# Patient Record
Sex: Male | Born: 1940 | Race: White | Hispanic: No | Marital: Married | Smoking: Former smoker
Health system: Southern US, Community
[De-identification: ages and names within clinical notes are randomized; demographics above are authoritative.]

## PROBLEM LIST (undated history)

## (undated) DIAGNOSIS — M549 Dorsalgia, unspecified: Secondary | ICD-10-CM

## (undated) DIAGNOSIS — I4892 Unspecified atrial flutter: Principal | ICD-10-CM

## (undated) DIAGNOSIS — G8929 Other chronic pain: Secondary | ICD-10-CM

## (undated) DIAGNOSIS — C61 Malignant neoplasm of prostate: Secondary | ICD-10-CM

## (undated) DIAGNOSIS — R51 Headache: Secondary | ICD-10-CM

## (undated) DIAGNOSIS — C439 Malignant melanoma of skin, unspecified: Secondary | ICD-10-CM

## (undated) DIAGNOSIS — I10 Essential (primary) hypertension: Secondary | ICD-10-CM

## (undated) DIAGNOSIS — E291 Testicular hypofunction: Secondary | ICD-10-CM

## (undated) DIAGNOSIS — E785 Hyperlipidemia, unspecified: Secondary | ICD-10-CM

## (undated) DIAGNOSIS — C459 Mesothelioma, unspecified: Secondary | ICD-10-CM

## (undated) DIAGNOSIS — Z8546 Personal history of malignant neoplasm of prostate: Secondary | ICD-10-CM

## (undated) HISTORY — DX: Testicular hypofunction: E29.1

## (undated) HISTORY — DX: Malignant melanoma of skin, unspecified: C43.9

## (undated) HISTORY — DX: Personal history of malignant neoplasm of prostate: Z85.46

## (undated) HISTORY — PX: COLONOSCOPY W/ POLYPECTOMY: SHX1380

## (undated) HISTORY — DX: Hyperlipidemia, unspecified: E78.5

## (undated) HISTORY — PX: CARDIAC CATHETERIZATION: SHX172

## (undated) HISTORY — DX: Unspecified atrial flutter: I48.92

---
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
## 1996-12-17 ENCOUNTER — Encounter: Payer: Self-pay | Admitting: Internal Medicine

## 2000-06-25 ENCOUNTER — Encounter: Payer: Self-pay | Admitting: Internal Medicine

## 2000-08-19 DIAGNOSIS — Z8719 Personal history of other diseases of the digestive system: Secondary | ICD-10-CM

## 2001-01-09 ENCOUNTER — Encounter: Payer: Self-pay | Admitting: Internal Medicine

## 2001-01-29 ENCOUNTER — Other Ambulatory Visit: Admission: RE | Admit: 2001-01-29 | Discharge: 2001-01-29 | Payer: Self-pay | Admitting: Urology

## 2001-01-29 ENCOUNTER — Encounter (INDEPENDENT_AMBULATORY_CARE_PROVIDER_SITE_OTHER): Payer: Self-pay | Admitting: Specialist

## 2002-02-10 ENCOUNTER — Encounter: Payer: Self-pay | Admitting: Internal Medicine

## 2003-12-13 HISTORY — PX: MELANOMA EXCISION: SHX5266

## 2004-07-23 ENCOUNTER — Encounter: Payer: Self-pay | Admitting: Internal Medicine

## 2005-07-26 ENCOUNTER — Ambulatory Visit (HOSPITAL_COMMUNITY): Admission: RE | Admit: 2005-07-26 | Discharge: 2005-07-27 | Payer: Self-pay | Admitting: Neurosurgery

## 2006-01-22 ENCOUNTER — Encounter: Admission: RE | Admit: 2006-01-22 | Discharge: 2006-01-22 | Payer: Self-pay | Admitting: Neurosurgery

## 2006-04-23 ENCOUNTER — Encounter: Payer: Self-pay | Admitting: Gastroenterology

## 2006-05-14 ENCOUNTER — Encounter: Payer: Self-pay | Admitting: Gastroenterology

## 2006-05-16 ENCOUNTER — Encounter: Payer: Self-pay | Admitting: Gastroenterology

## 2006-07-19 HISTORY — PX: BACK SURGERY: SHX140

## 2006-08-19 HISTORY — PX: ROTATOR CUFF REPAIR: SHX139

## 2006-11-03 ENCOUNTER — Encounter: Payer: Self-pay | Admitting: Internal Medicine

## 2007-06-24 ENCOUNTER — Encounter: Payer: Self-pay | Admitting: Internal Medicine

## 2007-10-09 ENCOUNTER — Ambulatory Visit: Payer: Self-pay | Admitting: Internal Medicine

## 2007-10-09 DIAGNOSIS — I10 Essential (primary) hypertension: Secondary | ICD-10-CM

## 2007-10-09 DIAGNOSIS — R209 Unspecified disturbances of skin sensation: Secondary | ICD-10-CM | POA: Insufficient documentation

## 2007-10-09 DIAGNOSIS — E785 Hyperlipidemia, unspecified: Secondary | ICD-10-CM | POA: Insufficient documentation

## 2007-10-12 ENCOUNTER — Telehealth: Payer: Self-pay | Admitting: Internal Medicine

## 2007-10-12 DIAGNOSIS — E291 Testicular hypofunction: Secondary | ICD-10-CM

## 2007-10-12 DIAGNOSIS — F528 Other sexual dysfunction not due to a substance or known physiological condition: Secondary | ICD-10-CM

## 2007-10-12 HISTORY — DX: Testicular hypofunction: E29.1

## 2007-10-16 ENCOUNTER — Telehealth (INDEPENDENT_AMBULATORY_CARE_PROVIDER_SITE_OTHER): Payer: Self-pay | Admitting: *Deleted

## 2007-10-16 LAB — CONVERTED CEMR LAB
CO2: 31 meq/L (ref 19–32)
Calcium: 9.1 mg/dL (ref 8.4–10.5)
Cholesterol: 156 mg/dL (ref 0–200)
Direct LDL: 58.1 mg/dL
GFR calc Af Amer: 78 mL/min
Glucose, Bld: 106 mg/dL — ABNORMAL HIGH (ref 70–99)
HDL: 37 mg/dL — ABNORMAL LOW (ref >39.0)
Potassium: 4.4 meq/L (ref 3.5–5.1)
Triglycerides: 313 mg/dL (ref 0–149)

## 2007-10-20 ENCOUNTER — Telehealth (INDEPENDENT_AMBULATORY_CARE_PROVIDER_SITE_OTHER): Payer: Self-pay | Admitting: *Deleted

## 2007-10-28 ENCOUNTER — Ambulatory Visit: Payer: Self-pay | Admitting: Internal Medicine

## 2007-10-29 LAB — CONVERTED CEMR LAB
Chloride: 108 meq/L (ref 96–112)
Creatinine, Ser: 1.3 mg/dL (ref 0.4–1.5)
Sodium: 142 meq/L (ref 135–145)

## 2007-11-25 ENCOUNTER — Encounter: Admission: RE | Admit: 2007-11-25 | Discharge: 2007-11-25 | Payer: Self-pay | Admitting: Neurosurgery

## 2007-12-03 ENCOUNTER — Encounter: Payer: Self-pay | Admitting: Internal Medicine

## 2007-12-17 ENCOUNTER — Ambulatory Visit: Payer: Self-pay | Admitting: Internal Medicine

## 2007-12-17 LAB — CONVERTED CEMR LAB
Blood in Urine, dipstick: NEGATIVE
Glucose, Urine, Semiquant: NEGATIVE
WBC Urine, dipstick: NEGATIVE
pH: 6.5

## 2007-12-21 ENCOUNTER — Telehealth (INDEPENDENT_AMBULATORY_CARE_PROVIDER_SITE_OTHER): Payer: Self-pay | Admitting: *Deleted

## 2007-12-21 ENCOUNTER — Telehealth: Payer: Self-pay | Admitting: Internal Medicine

## 2008-01-21 ENCOUNTER — Telehealth: Payer: Self-pay | Admitting: Internal Medicine

## 2008-02-03 ENCOUNTER — Encounter: Payer: Self-pay | Admitting: Internal Medicine

## 2008-02-05 ENCOUNTER — Ambulatory Visit: Payer: Self-pay | Admitting: Internal Medicine

## 2008-02-05 LAB — CONVERTED CEMR LAB: Glucose, Bld: 103 mg/dL

## 2008-02-11 LAB — CONVERTED CEMR LAB
ALT: 29 units/L (ref 0–53)
AST: 26 units/L (ref 0–37)
Albumin: 4 g/dL (ref 3.5–5.2)
Basophils Relative: 0.4 % (ref 0.0–1.0)
Eosinophils Relative: 3.7 % (ref 0.0–5.0)
Hemoglobin: 15.2 g/dL (ref 13.0–17.0)
Lymphocytes Relative: 28.9 % (ref 12.0–46.0)
Monocytes Relative: 9.4 % (ref 3.0–12.0)
Neutro Abs: 2.8 10*3/uL (ref 1.4–7.7)
RBC: 4.84 M/uL (ref 4.22–5.81)
Testosterone: 454.22 ng/dL (ref 350.00–890)
WBC: 4.9 10*3/uL (ref 4.5–10.5)

## 2008-03-07 ENCOUNTER — Encounter: Payer: Self-pay | Admitting: Internal Medicine

## 2008-03-09 ENCOUNTER — Encounter: Payer: Self-pay | Admitting: Internal Medicine

## 2008-03-11 ENCOUNTER — Encounter: Payer: Self-pay | Admitting: Internal Medicine

## 2008-03-11 ENCOUNTER — Telehealth: Payer: Self-pay | Admitting: Internal Medicine

## 2008-04-07 ENCOUNTER — Telehealth (INDEPENDENT_AMBULATORY_CARE_PROVIDER_SITE_OTHER): Payer: Self-pay | Admitting: *Deleted

## 2008-04-26 ENCOUNTER — Encounter: Payer: Self-pay | Admitting: Internal Medicine

## 2008-05-02 ENCOUNTER — Telehealth (INDEPENDENT_AMBULATORY_CARE_PROVIDER_SITE_OTHER): Payer: Self-pay | Admitting: *Deleted

## 2008-06-06 ENCOUNTER — Ambulatory Visit: Payer: Self-pay | Admitting: Internal Medicine

## 2008-06-06 LAB — CONVERTED CEMR LAB
Nitrite: NEGATIVE
Specific Gravity, Urine: <1.005
Urobilinogen, UA: NEGATIVE
WBC Urine, dipstick: NEGATIVE

## 2008-06-07 ENCOUNTER — Telehealth (INDEPENDENT_AMBULATORY_CARE_PROVIDER_SITE_OTHER): Payer: Self-pay | Admitting: *Deleted

## 2008-06-07 LAB — CONVERTED CEMR LAB
Direct LDL: 69.3 mg/dL
HDL: 38.4 mg/dL — ABNORMAL LOW (ref >39.0)
PSA: 8.82 ng/mL — ABNORMAL HIGH (ref 0.10–4.00)
Total CHOL/HDL Ratio: 4.2

## 2008-06-16 ENCOUNTER — Encounter: Payer: Self-pay | Admitting: Internal Medicine

## 2008-06-21 ENCOUNTER — Encounter: Payer: Self-pay | Admitting: Internal Medicine

## 2008-06-23 ENCOUNTER — Ambulatory Visit: Payer: Self-pay | Admitting: Family Medicine

## 2008-07-04 ENCOUNTER — Telehealth (INDEPENDENT_AMBULATORY_CARE_PROVIDER_SITE_OTHER): Payer: Self-pay | Admitting: *Deleted

## 2008-07-08 ENCOUNTER — Encounter: Payer: Self-pay | Admitting: Internal Medicine

## 2008-07-19 ENCOUNTER — Encounter: Payer: Self-pay | Admitting: Internal Medicine

## 2008-07-19 HISTORY — PX: PROSTATECTOMY: SHX69

## 2008-07-21 ENCOUNTER — Telehealth (INDEPENDENT_AMBULATORY_CARE_PROVIDER_SITE_OTHER): Payer: Self-pay | Admitting: *Deleted

## 2008-07-25 ENCOUNTER — Telehealth (INDEPENDENT_AMBULATORY_CARE_PROVIDER_SITE_OTHER): Payer: Self-pay | Admitting: *Deleted

## 2008-08-01 ENCOUNTER — Inpatient Hospital Stay (HOSPITAL_COMMUNITY): Admission: RE | Admit: 2008-08-01 | Discharge: 2008-08-03 | Payer: Self-pay | Admitting: Urology

## 2008-08-01 ENCOUNTER — Encounter (INDEPENDENT_AMBULATORY_CARE_PROVIDER_SITE_OTHER): Payer: Self-pay | Admitting: Urology

## 2008-08-19 HISTORY — PX: OTHER SURGICAL HISTORY: SHX169

## 2008-10-06 ENCOUNTER — Ambulatory Visit: Payer: Self-pay | Admitting: Internal Medicine

## 2008-10-06 DIAGNOSIS — Z8546 Personal history of malignant neoplasm of prostate: Secondary | ICD-10-CM

## 2008-10-06 HISTORY — DX: Personal history of malignant neoplasm of prostate: Z85.46

## 2008-10-11 ENCOUNTER — Telehealth (INDEPENDENT_AMBULATORY_CARE_PROVIDER_SITE_OTHER): Payer: Self-pay | Admitting: *Deleted

## 2008-10-11 LAB — CONVERTED CEMR LAB
AST: 35 units/L (ref 0–37)
CO2: 29 meq/L (ref 19–32)
Calcium: 9.2 mg/dL (ref 8.4–10.5)
Chloride: 106 meq/L (ref 96–112)
Cholesterol: 170 mg/dL (ref 0–200)
Direct LDL: 92.4 mg/dL
Glucose, Bld: 95 mg/dL (ref 70–99)
Hemoglobin: 15.3 g/dL (ref 13.0–17.0)
Sodium: 143 meq/L (ref 135–145)
Triglycerides: 218 mg/dL (ref 0–149)

## 2008-10-21 ENCOUNTER — Telehealth (INDEPENDENT_AMBULATORY_CARE_PROVIDER_SITE_OTHER): Payer: Self-pay | Admitting: *Deleted

## 2008-11-10 ENCOUNTER — Encounter: Payer: Self-pay | Admitting: Internal Medicine

## 2009-02-15 ENCOUNTER — Encounter: Payer: Self-pay | Admitting: Internal Medicine

## 2009-02-17 ENCOUNTER — Telehealth (INDEPENDENT_AMBULATORY_CARE_PROVIDER_SITE_OTHER): Payer: Self-pay | Admitting: *Deleted

## 2009-02-24 ENCOUNTER — Telehealth (INDEPENDENT_AMBULATORY_CARE_PROVIDER_SITE_OTHER): Payer: Self-pay | Admitting: *Deleted

## 2009-04-27 ENCOUNTER — Encounter: Payer: Self-pay | Admitting: Internal Medicine

## 2009-04-27 LAB — CONVERTED CEMR LAB
ALT: 31 units/L
AST: 31 units/L
Hemoglobin: 14.8 g/dL
Potassium, serum: 4.3 mmol/L
TSH: 1.499 microintl units/mL
WBC, blood: 4.6 10*3/uL

## 2009-05-12 ENCOUNTER — Encounter: Payer: Self-pay | Admitting: Internal Medicine

## 2009-05-26 ENCOUNTER — Ambulatory Visit: Payer: Self-pay | Admitting: Internal Medicine

## 2009-05-26 LAB — CONVERTED CEMR LAB
Bilirubin Urine: NEGATIVE
Glucose, Urine, Semiquant: NEGATIVE
Ketones, urine, test strip: NEGATIVE
Specific Gravity, Urine: 1.015
pH: 6

## 2009-05-30 ENCOUNTER — Ambulatory Visit: Payer: Self-pay | Admitting: Internal Medicine

## 2009-06-08 LAB — CONVERTED CEMR LAB
BUN: 19 mg/dL (ref 6–23)
Cholesterol: 142 mg/dL (ref 0–200)
Creatinine, Ser: 1.3 mg/dL (ref 0.4–1.5)
Direct LDL: 64.1 mg/dL
GFR calc non Af Amer: 58.29 mL/min (ref >60)
Glucose, Bld: 100 mg/dL — ABNORMAL HIGH (ref 70–99)
HDL: 33.2 mg/dL — ABNORMAL LOW (ref >39.00)
Potassium: 4.5 meq/L (ref 3.5–5.1)
Triglycerides: 312 mg/dL — ABNORMAL HIGH (ref 0.0–149.0)
VLDL: 62.4 mg/dL — ABNORMAL HIGH (ref 0.0–40.0)

## 2009-06-09 ENCOUNTER — Telehealth (INDEPENDENT_AMBULATORY_CARE_PROVIDER_SITE_OTHER): Payer: Self-pay | Admitting: *Deleted

## 2009-06-15 ENCOUNTER — Telehealth (INDEPENDENT_AMBULATORY_CARE_PROVIDER_SITE_OTHER): Payer: Self-pay | Admitting: *Deleted

## 2009-07-05 ENCOUNTER — Telehealth (INDEPENDENT_AMBULATORY_CARE_PROVIDER_SITE_OTHER): Payer: Self-pay | Admitting: *Deleted

## 2009-09-06 ENCOUNTER — Encounter: Payer: Self-pay | Admitting: Internal Medicine

## 2009-09-15 ENCOUNTER — Encounter: Payer: Self-pay | Admitting: Internal Medicine

## 2009-11-21 ENCOUNTER — Telehealth: Payer: Self-pay | Admitting: Internal Medicine

## 2009-11-22 ENCOUNTER — Ambulatory Visit: Payer: Self-pay | Admitting: Internal Medicine

## 2009-11-24 ENCOUNTER — Ambulatory Visit: Payer: Self-pay | Admitting: Internal Medicine

## 2009-11-29 LAB — CONVERTED CEMR LAB
ALT: 28 units/L (ref 0–53)
AST: 29 units/L (ref 0–37)
BUN: 27 mg/dL — ABNORMAL HIGH (ref 6–23)
Chloride: 105 meq/L (ref 96–112)
Creatinine, Ser: 1.6 mg/dL — ABNORMAL HIGH (ref 0.4–1.5)
GFR calc non Af Amer: 45.81 mL/min (ref >60)
HDL: 36.6 mg/dL — ABNORMAL LOW (ref >39.00)
Potassium: 3.9 meq/L (ref 3.5–5.1)
Total CHOL/HDL Ratio: 4

## 2009-12-25 ENCOUNTER — Ambulatory Visit: Payer: Self-pay | Admitting: Internal Medicine

## 2009-12-26 LAB — CONVERTED CEMR LAB
CO2: 30 meq/L (ref 19–32)
Glucose, Bld: 90 mg/dL (ref 70–99)
Potassium: 4.4 meq/L (ref 3.5–5.1)
Sodium: 144 meq/L (ref 135–145)

## 2010-01-14 ENCOUNTER — Telehealth: Payer: Self-pay | Admitting: Internal Medicine

## 2010-01-16 ENCOUNTER — Encounter: Payer: Self-pay | Admitting: Internal Medicine

## 2010-01-26 ENCOUNTER — Encounter: Payer: Self-pay | Admitting: Internal Medicine

## 2010-01-30 ENCOUNTER — Ambulatory Visit: Payer: Self-pay

## 2010-01-30 ENCOUNTER — Encounter: Payer: Self-pay | Admitting: Internal Medicine

## 2010-03-05 ENCOUNTER — Telehealth: Payer: Self-pay | Admitting: Internal Medicine

## 2010-03-26 ENCOUNTER — Ambulatory Visit: Payer: Self-pay | Admitting: Internal Medicine

## 2010-03-26 ENCOUNTER — Telehealth: Payer: Self-pay | Admitting: Internal Medicine

## 2010-03-26 DIAGNOSIS — M5416 Radiculopathy, lumbar region: Secondary | ICD-10-CM | POA: Insufficient documentation

## 2010-04-03 ENCOUNTER — Telehealth (INDEPENDENT_AMBULATORY_CARE_PROVIDER_SITE_OTHER): Payer: Self-pay | Admitting: *Deleted

## 2010-05-01 ENCOUNTER — Encounter: Payer: Self-pay | Admitting: Gastroenterology

## 2010-05-08 ENCOUNTER — Telehealth: Payer: Self-pay | Admitting: Internal Medicine

## 2010-05-10 ENCOUNTER — Encounter (INDEPENDENT_AMBULATORY_CARE_PROVIDER_SITE_OTHER): Payer: Self-pay | Admitting: *Deleted

## 2010-05-29 ENCOUNTER — Encounter: Payer: Self-pay | Admitting: Internal Medicine

## 2010-06-22 ENCOUNTER — Encounter (INDEPENDENT_AMBULATORY_CARE_PROVIDER_SITE_OTHER): Payer: Self-pay | Admitting: *Deleted

## 2010-06-25 ENCOUNTER — Telehealth: Payer: Self-pay | Admitting: Internal Medicine

## 2010-06-27 ENCOUNTER — Ambulatory Visit: Payer: Self-pay | Admitting: Gastroenterology

## 2010-07-11 ENCOUNTER — Ambulatory Visit: Payer: Self-pay | Admitting: Gastroenterology

## 2010-07-16 ENCOUNTER — Encounter: Payer: Self-pay | Admitting: Gastroenterology

## 2010-08-08 ENCOUNTER — Encounter: Payer: Self-pay | Admitting: Internal Medicine

## 2010-08-22 ENCOUNTER — Encounter: Payer: Self-pay | Admitting: Internal Medicine

## 2010-09-12 ENCOUNTER — Encounter: Payer: Self-pay | Admitting: Internal Medicine

## 2010-09-16 LAB — CONVERTED CEMR LAB
Basophils Absolute: 0 10*3/uL (ref 0.0–0.1)
Eosinophils Absolute: 0.2 10*3/uL (ref 0.0–0.7)
Eosinophils Relative: 3.2 % (ref 0.0–5.0)
GFR calc non Af Amer: 59.2 mL/min (ref >60)
MCHC: 34.9 g/dL (ref 30.0–36.0)
MCV: 91.4 fL (ref 78.0–100.0)
Monocytes Absolute: 0.4 10*3/uL (ref 0.1–1.0)
Neutrophils Relative %: 55.5 % (ref 43.0–77.0)
Platelets: 184 10*3/uL (ref 150.0–400.0)
Potassium: 4.5 meq/L (ref 3.5–5.1)
RDW: 13.5 % (ref 11.5–14.6)
Sodium: 141 meq/L (ref 135–145)
WBC: 5.7 10*3/uL (ref 4.5–10.5)

## 2010-09-18 NOTE — Progress Notes (Signed)
Summary: giving plasma  Phone Note Call from Patient Call back at [facility] Phone [phone]   Summary of Call: Pt would like to know if it would be safe for him to give plasma. Army Fossa CMA  March 26, 2010 10:05 AM   Follow-up for Phone Call        okay from my standpoint Jose E. Paz MD  March 27, 2010 3:48 PM   Additional Follow-up for Phone Call Additional follow up Details #1::        Spoke with pt he is aware. Army Fossa CMA  March 27, 2010 4:16 PM

## 2010-09-18 NOTE — Letter (Signed)
Summary: [facility]  [facility]   Imported By: Lanelle Bal 07/16/2010 09:17:22  _____________________________________________________________________  External Attachment:    Type:   Image     Comment:   External Document

## 2010-09-18 NOTE — Letter (Signed)
Summary: f/u prostate ca , PSA undetectable, on androgel----Urology   [facility]   Imported By: Lanelle Bal 01/24/2010 13:00:44  _____________________________________________________________________  External Attachment:    Type:   Image     Comment:   External Document

## 2010-09-18 NOTE — Assessment & Plan Note (Signed)
Summary: cpx/kdc   Vital Signs:  Patient profile:   70 year old male Height:      72 inches Weight:      212.50 pounds Pulse rate:   75 / minute Pulse rhythm:   regular BP sitting:   136 / 82  (left arm) Cuff size:   large  Vitals Entered By: Army Fossa CMA (March 26, 2010 9:17 AM) CC: CPX: not fasting Comments unsure of last colonoscopy.   History of Present Illness: Here for Medicare AWV:  1.   Risk factors based on Past M, S, F history: yes  2.   Physical Activities: active, yard work, [facility] to the [facility]" 2-[facility] 3.   Depression/mood: denies, no problems notes  4.   Hearing: decreased hearing. s/p eval, was told hearing aid won't help 5.   ADL's: totally independent  6.   Fall Risk: low risk  7.   Home Safety: feels safe at home  8.   Height, weight, &visual acuity: see VS, vision corrected  9.   Counseling: yes  10.   Labs ordered based on risk factors: yes   11.           Referral Coordination: if needed  12.           Care Plan-- see a/p  13.            Cognitive Assessment: alertness, memory and motor skills seem  appropriate  in addition to above, we also discussed the following....  high cholesterol -- on simva x 10 years, no s/e , no myalgias  "other than from working out" HTN - no ambulatory BPs , good medication compliance  HYPOGONADISM, on  HRT per urology Prostate cancer, sees urology routinely      Preventive Screening-Counseling & Management  Caffeine-Diet-Exercise     Does Patient Exercise: yes     Times/week: 3  Current Medications (verified): 1)  Simvastatin 20 Mg Tabs (Simvastatin) .Marland Kitchen.. 1 By Mouth Daily. 2)  Amlodipine Besylate 5 Mg  Tabs (Amlodipine Besylate) .Marland Kitchen.. 1 By Mouth Once Daily 3)  Benazepril Hcl 40 Mg  Tabs (Benazepril Hcl) .Marland Kitchen.. 1 By Mouth Once Daily 4)  Gabapentin 600 Mg Tabs (Gabapentin) .Marland Kitchen.. 1 By Mouth Two Times A Day 5)  Viagra 100 Mg  Tabs (Sildenafil Citrate) .... As Directed 6)  Zolpidem Tartrate 10 Mg  Tabs (Zolpidem  Tartrate) .Marland Kitchen.. 1 By Mouth At Bedtime As Needed 7)  Hydrocodone-Acetaminophen 5-500 Mg  Tabs (Hydrocodone-Acetaminophen) .... Prn 8)  Cyclobenzaprine Hcl 10 Mg  Tabs (Cyclobenzaprine Hcl) .... Two Times A Day 9)  [facility] Aspirin Ec 81 Mg  Tbec (Aspirin) .Marland Kitchen.. 1 A Day 10)  Vit D, B, C 11)  Androgel .... Per Urology 12)  Omega 3 13)  Fenofibrate 160 Mg Tabs (Fenofibrate) .Marland Kitchen.. 1 By Mouth Once Daily  Allergies (verified): No Known Drug Allergies  Past History:  Past Medical History: HYPERTENSION , Dx aprox 2000 HYPERLIPIDEMIA  DIVERTICULITIS, HX OF 2002,admitted to the hospital one time, status-post two outpatient episodes HYPOGONADISM, MALE  ERECTILE DYSFUNCTION   PARESTHESIAS  LE,  per neurosurgery notes a combination of neuropathy and chronic L5 radiculopathy, on neurontin. F/u also w/ Dr Clarisse Gouge Prostate cancer, hx of (08/2008)  Past Surgical History: Reviewed history from 10/06/2008 and no changes required. rt shoulder surgery (2008) back surgery (2006) melanoma removed left arm (2005) Prostatectomy (09/13/2008)  Family History: CAD - F (MI age 72) HTN - F, M DM - M, bro stroke -  no colon Ca - no prostate Ca - bro, F  Social History: Married 2 kids in [place] since '84 Former Smoker Alcohol use-yes (moderately 1 drinks/wk) exercise-- active, goes to the "[facility]" diet healthy most of the time per patient  Does Patient Exercise:  yes  Review of Systems General:  Denies fatigue and fever. CV:  Denies chest pain or discomfort and swelling of feet. Resp:  Denies cough and shortness of breath. GI:  Denies bloody stools, nausea, and vomiting. GU:  Denies dysuria and hematuria. Psych:  Denies anxiety and depression.  Physical Exam  General:  alert, well-developed, and well-nourished.   Neck:  no masses and no thyromegaly.   Lungs:  normal respiratory effort, no intercostal retractions, no accessory muscle use, and normal breath sounds.   Heart:  normal rate, regular rhythm, and  no murmur.   Abdomen:  soft, non-tender, no distention, no masses, no guarding, and no rigidity.   Extremities:  no lower extremity edema Psych:  Cognition and judgment appear intact. Alert and cooperative with normal attention span and concentration.  not anxious appearing and not depressed appearing.     Impression & Recommendations:  Problem # 1:  HEALTH SCREENING (ICD-V70.0)  Td 2009 pneumonia shot-- at age 65 per patient  shingles shot 10-09-07  Cscope @ age 18 in [place] per patient  (no report available )  counsed: continue with healthy lifestyle  Orders: Medicare -1st Annual Wellness Visit [phone])  Problem # 2:  HYPOGONADISM, MALE (ICD-257.2) HRT per urology  Problem # 3:  HYPERTENSION (ICD-401.9)  at goal  EKG--sinus rhythm BMP today to monitor kidney function (creat was elevated, better after he d/c diclofenac) His updated medication list for this problem includes:    Amlodipine Besylate 5 Mg Tabs (Amlodipine besylate) .Marland Kitchen... 1 by mouth once daily    Benazepril Hcl 40 Mg Tabs (Benazepril hcl) .Marland Kitchen... 1 by mouth once daily  BP today: 136/82 Prior BP: 140/80 (11/22/2009)  Labs Reviewed: K+: 4.4 (12/25/2009) Creat: : 1.4 (12/25/2009)   Chol: 129 (11/24/2009)   HDL: 36.60 (11/24/2009)   LDL: 66 (11/24/2009)   TG: 130.0 (11/24/2009)  Orders: Venipuncture (60454) TLB-CBC Platelet - w/Differential (85025-CBCD) TLB-BMP (Basic Metabolic Panel-BMET) (80048-METABOL) EKG w/ Interpretation (93000)  Problem # 4:  HYPERLIPIDEMIA (ICD-272.4) on simvastatin for many years and also on  Norvasc. we recently decreased dose of simva will RTC fasting for labs  His updated medication list for this problem includes:    Simvastatin 20 Mg Tabs (Simvastatin) .Marland Kitchen... 1 by mouth daily.    Fenofibrate 160 Mg Tabs (Fenofibrate) .Marland Kitchen... 1 by mouth once daily  Labs Reviewed: SGOT: 29 (11/24/2009)   SGPT: 28 (11/24/2009)   HDL:36.60 (11/24/2009), 33.20 (05/30/2009)  LDL:66 (11/24/2009), DEL  (10/06/2008)  Chol:129 (11/24/2009), 142 (05/30/2009)  Trig:130.0 (11/24/2009), 312.0 (05/30/2009)  Problem # 5:  DEGENERATIVE JOINT DISEASE (ICD-715.90) RF hydrocodone, takes it rarely (2 times a week aprox) His updated medication list for this problem includes:    Hydrocodone-acetaminophen 5-500 Mg Tabs (Hydrocodone-acetaminophen) .Marland Kitchen... Prn    Cvs Aspirin Ec 81 Mg Tbec (Aspirin) .Marland Kitchen... 1 a day  Complete Medication List: 1)  Simvastatin 20 Mg Tabs (Simvastatin) .Marland Kitchen.. 1 by mouth daily. 2)  Fenofibrate 160 Mg Tabs (Fenofibrate) .Marland Kitchen.. 1 by mouth once daily 3)  Amlodipine Besylate 5 Mg Tabs (Amlodipine besylate) .Marland Kitchen.. 1 by mouth once daily 4)  Benazepril Hcl 40 Mg Tabs (Benazepril hcl) .Marland Kitchen.. 1 by mouth once daily 5)  Gabapentin 600 Mg Tabs (Gabapentin) .Marland KitchenMarland KitchenMarland Kitchen 1  by mouth two times a day 6)  Viagra 100 Mg Tabs (Sildenafil citrate) .... As directed 7)  Zolpidem Tartrate 10 Mg Tabs (Zolpidem tartrate) .Marland Kitchen.. 1 by mouth at bedtime as needed 8)  Hydrocodone-acetaminophen 5-500 Mg Tabs (Hydrocodone-acetaminophen) .... Prn 9)  Cyclobenzaprine Hcl 10 Mg Tabs (Cyclobenzaprine hcl) .... Two times a day 10)  Cvs Aspirin Ec 81 Mg Tbec (Aspirin) .Marland Kitchen.. 1 a day 11)  Vit D, B, C  12)  Androgel  .... Per urology 13)  Omega 3   Patient Instructions: 1)  Please schedule a follow-up appointment in 6 months .  Prescriptions: HYDROCODONE-ACETAMINOPHEN 5-500 MG  TABS (HYDROCODONE-ACETAMINOPHEN) prn  #90 x 0   Entered and Authorized by:   Nolon Rod. Paz MD   Signed by:   Nolon Rod. Paz MD on 03/26/2010   Method used:   Print then Give to Patient   RxID:   [id number]    Risk Factors:  Exercise:  yes    Times per week:  3    Preventive Care Screening  Last Pneumovax:    Date:  08/20/2007    Results:  given

## 2010-09-18 NOTE — Progress Notes (Signed)
Summary: refill  Phone Note Refill Request Message from:  Fax from Pharmacy on June 25, 2010 11:43 AM  Refills Requested: Medication #1:  ZOLPIDEM TARTRATE 10 MG  TABS 1 by mouth at bedtime as needed [facility] [phone]  Initial call taken by: Okey Regal [place],  June 25, 2010 11:45 AM  Follow-up for Phone Call        last filled 11/22/09 x 6 refills. Follow-up by: Army Fossa CMA,  June 25, 2010 11:47 AM  Additional Follow-up for Phone Call Additional follow up Details #1::        ok #30 and 6 RF Shamere Dilworth E. Yosef Krogh MD  June 25, 2010 1:40 PM     Prescriptions: ZOLPIDEM TARTRATE 10 MG  TABS (ZOLPIDEM TARTRATE) 1 by mouth at bedtime as needed  #30 x 6   Entered by:   Army Fossa CMA   Authorized by:   Nolon Rod. Demontre Padin MD   Signed by:   Army Fossa CMA on 06/25/2010   Method used:   Printed then faxed [facility] ...       [facility]  [address] [phone]* (retail)       [address]       [place], [place]  [address]       Ph: [phone] or [phone]       Fax: [phone]   RxID:   [id number]

## 2010-09-18 NOTE — Progress Notes (Signed)
Summary: Refill Request  Phone Note Refill Request Message from:  [facility] [address] Fax #: [phone]  Refills Requested: Medication #1:  ZOLPIDEM TARTRATE 10 MG  TABS 1 by mouth at bedtime as needed   Dosage confirmed as above?Dosage Confirmed   Supply Requested: 1 month   Last Refilled: 08/23/2009 Next Appointment Scheduled: 4.6.11 Initial call taken by: Harold Barban,  November 21, 2009 8:24 AM  Follow-up for Phone Call        #30 with 6 refills on 02/24/09, has appt on 4/6 [facility]  November 21, 2009 5:18 PM     Prescriptions: ZOLPIDEM TARTRATE 10 MG  TABS (ZOLPIDEM TARTRATE) 1 by mouth at bedtime as needed  #30 x 6   Entered by:   Shary Decamp   Authorized by:   Nolon Rod. Mckay Brandt MD   Signed by:   Shary Decamp on 11/22/2009   Method used:   Printed then faxed to ...       [facility]  [address] [phone]* (retail)       [address]       [place], [place]  [address]       Ph: [phone] or [phone]       Fax: [phone]   RxID:   [id number]

## 2010-09-18 NOTE — Letter (Signed)
Summary: [facility]  [facility]   Imported By: Lanelle Bal 07/16/2010 09:18:28  _____________________________________________________________________  External Attachment:    Type:   Image     Comment:   External Document

## 2010-09-18 NOTE — Letter (Signed)
Summary: prostate cancer f/u , low testost, on AndroGel, stable Urology    [facility]   Imported By: Lanelle Bal 06/26/2010 12:43:11  _____________________________________________________________________  External Attachment:    Type:   Image     Comment:   External Document

## 2010-09-18 NOTE — Procedures (Signed)
Summary: Colonoscopy  Patient: [name] Note: All result statuses are Final unless otherwise noted.  Tests: (1) Colonoscopy (COL)   COL Colonoscopy           DONE     [facility]     [address]     [place], [place]  [address]           COLONOSCOPY PROCEDURE REPORT           PATIENT:  [name], [name]  MR#:  [id number]     BIRTHDATE:  03-12-1941, 69 yrs. old  GENDER:  male     ENDOSCOPIST:  Judie Petit T. Russella Dar, MD, [facility]     Referred by:  Willow Ora, M.D.     PROCEDURE DATE:  07/11/2010     PROCEDURE:  Colonoscopy with snare polypectomy     ASA CLASS:  Class II     INDICATIONS:  1) surveillance and high-risk screening  2) history     of adenomatous colon polyps: 04/2006     MEDICATIONS:   Fentanyl 75 mcg IV, Versed 8 mg IV     DESCRIPTION OF PROCEDURE:   After the risks benefits and     alternatives of the procedure were thoroughly explained, informed     consent was obtained.  Digital rectal exam was performed and     revealed no abnormalities.   The LB [id number] endoscope     was introduced through the anus and advanced to the cecum, which     was identified by both the appendix and ileocecal valve, without     limitations.  The quality of the prep was excellent, using     MoviPrep.  The instrument was then slowly withdrawn as the colon     was fully examined.     <<PROCEDUREIMAGES>>     FINDINGS:  Three polyps were found in the cecum. They were 5 - 12     mm in size. Polyps were snared, then cauterized with monopolar     cautery. Retrieval was successful. Piecemeal polypectomy of the 10     and 12 mm polyps.  Two polyps were found in the ascending colon.     They were 5 - 7 mm in size. Polyps were snared without cautery.     Retrieval was successful. Moderate diverticulosis was found in the     sigmoid to descending colon.  This was otherwise a normal     examination of the colon.   Retroflexed views in the rectum     revealed internal hemorrhoids, small.  The  time to cecum =  2     minutes. The scope was then withdrawn (time =  21  min) from the     patient and the procedure completed.           COMPLICATIONS:  None           ENDOSCOPIC IMPRESSION:     1) 5 - 12 mm Three polyps in the cecum     2) 5 - 7 mm Two polyps in the ascending colon     3) Moderate diverticulosis in the sigmoid to descending colon     4) Internal hemorrhoids           RECOMMENDATIONS:     1) Hold aspirin, aspirin products, and anti-inflamatory     medication for 2 weeks.     2) Await pathology results     3) Repeat Colonoscopy in 1 year  if polyps adenomatous, otherwise     3 years.           Venita Lick. Russella Dar, MD, [facility]           n.     eSIGNED:   Venita Lick. Stark at 07/11/2010 10:26 AM           Meta Hatchet, [id number]  Note: An exclamation mark (!) indicates a result that was not dispersed into the flowsheet. Document Creation Date: 07/11/2010 10:27 AM _______________________________________________________________________  (1) Order result status: Final Collection or observation date-time: 07/11/2010 10:19 Requested date-time:  Receipt date-time:  Reported date-time:  Referring Physician:   Ordering Physician: Claudette Head [phone]) Specimen Source:  Source: Launa Grill Order Number: [id number] Lab site:   Appended Document: Colonoscopy     Procedures Next Due Date:    Colonoscopy: 06/2011

## 2010-09-18 NOTE — Letter (Signed)
Summary: [facility] Instructions  [facility]  [address] [place], [place] [address]   Phone: [phone]  Fax: [phone]       [name]    1941-03-28    MRN: [id number]        Procedure Day Dorna Bloom: Wednesday 07-11-10     Arrival Time: 9:00 am     Procedure Time: 10:00 am     Location of Procedure:                    _x _  [facility] (4th Floor)   PREPARATION FOR COLONOSCOPY WITH MOVIPREP   Starting 5 days prior to your procedure  07-06-10 do not eat nuts, seeds, popcorn, corn, beans, peas,  salads, or any raw vegetables.  Do not take any fiber supplements (e.g. Metamucil, Citrucel, and Benefiber).  THE DAY BEFORE YOUR PROCEDURE         DATE:  07-10-10  DAY:  Tuesday  1.  Drink clear liquids the entire day-NO SOLID FOOD  2.  Do not drink anything colored red or purple.  Avoid juices with pulp.  No orange juice.  3.  Drink at least 64 oz. (8 glasses) of fluid/clear liquids during the day to prevent dehydration and help the prep work efficiently.  CLEAR LIQUIDS INCLUDE: Water Jello Ice Popsicles Tea (sugar ok, no milk/cream) Powdered fruit flavored drinks Coffee (sugar ok, no milk/cream) Gatorade Juice: apple, white grape, white cranberry  Lemonade Clear bullion, consomm, broth Carbonated beverages (any kind) Strained chicken noodle soup Hard Candy                             4.  In the morning, mix first dose of MoviPrep solution:    Empty 1 Pouch A and 1 Pouch B into the disposable container    Add lukewarm drinking water to the top line of the container. Mix to dissolve    Refrigerate (mixed solution should be used within 24 hrs)  5.  Begin drinking the prep at 5:00 p.m. The MoviPrep container is divided by 4 marks.   Every 15 minutes drink the solution down to the next mark (approximately 8 oz) until the full liter is complete.   6.  Follow completed prep with 16 oz of clear liquid of your choice (Nothing red or purple).   Continue to drink clear liquids until bedtime.  7.  Before going to bed, mix second dose of MoviPrep solution:    Empty 1 Pouch A and 1 Pouch B into the disposable container    Add lukewarm drinking water to the top line of the container. Mix to dissolve    Refrigerate  THE DAY OF YOUR PROCEDURE      DATE:  07-11-10  DAY:  Wednesday  Beginning at   5:00 a.m. (5 hours before procedure):         1. Every 15 minutes, drink the solution down to the next mark (approx 8 oz) until the full liter is complete.  2. Follow completed prep with 16 oz. of clear liquid of your choice.    3. You may drink clear liquids until  8:00 a.m.  (2 HOURS BEFORE PROCEDURE).   MEDICATION INSTRUCTIONS  Unless otherwise instructed, you should take regular prescription medications with a small sip of water   as early as possible the morning of your procedure.        OTHER INSTRUCTIONS  You will need a responsible adult at least 70 years of age to accompany you and drive you home.   This person must remain in the waiting room during your procedure.  Wear loose fitting clothing that is easily removed.  Leave jewelry and other valuables at home.  However, you may wish to bring a book to read or  an iPod/MP3 player to listen to music as you wait for your procedure to start.  Remove all body piercing jewelry and leave at home.  Total time from sign-in until discharge is approximately 2-3 hours.  You should go home directly after your procedure and rest.  You can resume normal activities the  day after your procedure.  The day of your procedure you should not:   Drive   Make legal decisions   Operate machinery   Drink alcohol   Return to work  You will receive specific instructions about eating, activities and medications before you leave.    The above instructions have been reviewed and explained to me by   Wyona Almas RN  June 27, 2010 10:29 AM     I fully understand and can  verbalize these instructions _____________________________ Date _________

## 2010-09-18 NOTE — Progress Notes (Signed)
Summary: Colonoscopy  Phone Note Call from Patient Call back at Work Phone [phone]   Caller: Patient Summary of Call: Patient called to let us know that he had his last colonoscpoy 4 years ago with Dr. Bascom Levels in [facility] and they want him to sch another one because of his polps and family hx. He went ahead and sch the appts. He wanted to make sure this doctor was ok? Initial call taken by: Harold Barban,  April 03, 2010 11:21 AM  Follow-up for Phone Call        ok w/ me Nolon Rod. Paz MD  April 04, 2010 9:14 AM   Additional Follow-up for Phone Call Additional follow up Details #1::        Left detailed message informing him this was ok.  Additional Follow-up by: Harold Barban,  April 04, 2010 9:40 AM

## 2010-09-18 NOTE — Miscellaneous (Signed)
Summary: [facility] Previsit/prep  Clinical Lists Changes  Medications: Added new medication of MOVIPREP 100 GM  SOLR (PEG-KCL-NACL-NASULF-NA ASC-C) As per prep instructions. - Signed Rx of MOVIPREP 100 GM  SOLR (PEG-KCL-NACL-NASULF-NA ASC-C) As per prep instructions.;  #1 x 0;  Signed;  Entered by: Wyona Almas RN;  Authorized by: Meryl Dare MD [facility];  Method used: [facility]  [address] [phone]*, [address] [place], [place], [place]  [address], Ph: [phone] or [phone], Fax: [phone] Observations: Added new observation of NKA: T (06/27/2010 9:50)    Prescriptions: MOVIPREP 100 GM  SOLR (PEG-KCL-NACL-NASULF-NA ASC-C) As per prep instructions.  #1 x 0   Entered by:   Wyona Almas RN   Authorized by:   Meryl Dare MD [facility]   Signed by:   Wyona Almas RN on 06/27/2010   Method used:   Electronically to        [facility]  [address] [phone]* (retail)       [address]       [place], [place]  [id number]       Ph: [phone] or [phone]       Fax: [phone]   RxID:   [id number]

## 2010-09-18 NOTE — Progress Notes (Signed)
Summary: new referral  Phone Note Call from Patient   Caller: Patient Summary of Call: Pt called and states that he is not comfortable with seeing Dr.[facility] for his colonoscopy. He would like to be seen within the [place] group. Please advise if it is okay to put in a new referral. Army Fossa CMA  May 08, 2010 8:57 AM   Follow-up for Phone Call        please do Follow-up by: [facility] E. Paz MD,  May 08, 2010 3:44 PM

## 2010-09-18 NOTE — Assessment & Plan Note (Signed)
Summary: 6 MTH FU/NS/KDC   Vital Signs:  Patient profile:   70 year old male Height:      72 inches Weight:      220 pounds BMI:     29.95 Pulse rate:   60 / minute BP sitting:   140 / 80  Vitals Entered By: Shary Decamp (November 22, 2009 10:04 AM) CC: rov - not fasting   History of Present Illness: HYPERTENSION -- ambulatory BPs rarely checked, usually better than today   HYPERLIPIDEMIA -- chol meds changed base on last FLP, tolerates well   HYPOGONADISM, MALE -- on HRT per urology  ERECTILE DYSFUNCTION  -- using VEC per urology note   PARESTHESIAS  LE-- f/u w/ Dr Vela Prose, was advised to "keep an eye in the sugar"  Prostate cancer-- note from urology reviewed,stable    Current Medications (verified): 1)  Simvastatin 40 Mg  Tabs (Simvastatin) .Marland Kitchen.. 1 By Mouth Qd 2)  Amlodipine Besylate 5 Mg  Tabs (Amlodipine Besylate) .Marland Kitchen.. 1 By Mouth Once Daily 3)  Benazepril Hcl 40 Mg  Tabs (Benazepril Hcl) .Marland Kitchen.. 1 By Mouth Once Daily 4)  Diclofenac Sodium 75 Mg  Tbec (Diclofenac Sodium) .... Bid 5)  Gabapentin 600 Mg Tabs (Gabapentin) .Marland Kitchen.. 1 By Mouth Two Times A Day 6)  Viagra 100 Mg  Tabs (Sildenafil Citrate) .... As Directed 7)  Zolpidem Tartrate 10 Mg  Tabs (Zolpidem Tartrate) .Marland Kitchen.. 1 By Mouth At Bedtime As Needed 8)  Hydrocodone-Acetaminophen 5-500 Mg  Tabs (Hydrocodone-Acetaminophen) .... Prn 9)  Cyclobenzaprine Hcl 10 Mg  Tabs (Cyclobenzaprine Hcl) .... Two Times A Day 10)  [facility]vs Aspirin Ec 81 Mg  Tbec (Aspirin) .Marland Kitchen.. 1 A Day 11)  Vit D, B, C 12)  Androgel .... Per Urology 13)  Omega 3 14)  Fenofibrate 160 Mg Tabs (Fenofibrate) .Marland Kitchen.. 1 By Mouth Once Daily  Allergies (verified): No Known Drug Allergies  Past History:  Past Medical History: HYPERTENSION , Dx aprox 2000 HYPERLIPIDEMIA  DIVERTICULITIS, HX OF 2002,admitted to the hospital one time, status-post two outpatient episodes HYPOGONADISM, MALE  ERECTILE DYSFUNCTION   PARESTHESIAS  LE,  per neurosurgery notes a combination  of neuropathy and chronic L5 radiculopathy, on neurontin. F/u also w/ Dr Clarisse Gouge Prostate cancer, hx of (08/2008)  Past Surgical History: Reviewed history from 10/06/2008 and no changes required. rt shoulder surgery (2008) back surgery (2006) melanoma removed left arm (2005) Prostatectomy (09/13/2008)  Social History: Married 2 kids in [place] since '84 Former Smoker Alcohol use-yes (moderately 3 drinks/wk) exercise-- doing more lately diet healthy most of the time per patient   Review of Systems CV:  Denies chest pain or discomfort and swelling of feet. Resp:  Denies shortness of breath; occasionally cough , thinks related to polen, no fever. GI:  Denies bloody stools, diarrhea, nausea, and vomiting. GU:  Denies hematuria, urinary frequency, and urinary hesitancy. Neuro:  occ.  has tingling at the L  hand, by the first and second finger along with some tingling close to the elbow no nocturnal symptoms no neck pain no overuse of the left hand.  Physical Exam  General:  alert, well-developed, and well-nourished.   Lungs:  normal respiratory effort, no intercostal retractions, no accessory muscle use, and normal breath sounds.   Heart:  normal rate, regular rhythm, and no murmur.   Extremities:  no lower extremity edema Neurologic:  neck full range of motion upper extremities with normal pinprick examination  motor symmetric rule out reflexes symmetric   Impression &  Recommendations:  Problem # 1:  PARESTHESIA (ICD-782.0) lower extremity paresthesia follow-up by neurology was told to "keep an eye on the sugar" today he also has a left arm paresthesia, see review of systems, neurological exam normal.  Observation  Problem # 2:  HYPERTENSION (ICD-401.9) well-controlled, labs His updated medication list for this problem includes:    Amlodipine Besylate 5 Mg Tabs (Amlodipine besylate) .Marland Kitchen... 1 by mouth once daily    Benazepril Hcl 40 Mg Tabs (Benazepril hcl) .Marland Kitchen... 1 by mouth  once daily  BP today: 140/80 Prior BP: 124/80 (05/26/2009)  Labs Reviewed: K+: 4.5 (05/30/2009) Creat: : 1.3 (05/30/2009)   Chol: 142 (05/30/2009)   HDL: 33.20 (05/30/2009)   LDL: DEL (10/06/2008)   TG: 312.0 (05/30/2009)  Problem # 3:  HYPERLIPIDEMIA (ICD-272.4) chol meds changed base on last FLP, niacin was discontinued now on fenofibrate labs His updated medication list for this problem includes:    Simvastatin 40 Mg Tabs (Simvastatin) .Marland Kitchen... 1 by mouth qd    Fenofibrate 160 Mg Tabs (Fenofibrate) .Marland Kitchen... 1 by mouth once daily  Labs Reviewed: SGOT: 31 (May 27, 202010)   SGPT: 31 (May 27, 202010)   HDL:33.20 (05/30/2009), 41.4 (10/06/2008)  LDL:DEL (10/06/2008), DEL (06/06/2008)  Chol:142 (05/30/2009), 170 (10/06/2008)  Trig:312.0 (05/30/2009), 218 (10/06/2008)  Complete Medication List: 1)  Simvastatin 40 Mg Tabs (Simvastatin) .Marland Kitchen.. 1 by mouth qd 2)  Amlodipine Besylate 5 Mg Tabs (Amlodipine besylate) .Marland Kitchen.. 1 by mouth once daily 3)  Benazepril Hcl 40 Mg Tabs (Benazepril hcl) .Marland Kitchen.. 1 by mouth once daily 4)  Diclofenac Sodium 75 Mg Tbec (Diclofenac sodium) .... Bid 5)  Gabapentin 600 Mg Tabs (Gabapentin) .Marland Kitchen.. 1 by mouth two times a day 6)  Viagra 100 Mg Tabs (Sildenafil citrate) .... As directed 7)  Zolpidem Tartrate 10 Mg Tabs (Zolpidem tartrate) .Marland Kitchen.. 1 by mouth at bedtime as needed 8)  Hydrocodone-acetaminophen 5-500 Mg Tabs (Hydrocodone-acetaminophen) .... Prn 9)  Cyclobenzaprine Hcl 10 Mg Tabs (Cyclobenzaprine hcl) .... Two times a day 10)  [facility]vs Aspirin Ec 81 Mg Tbec (Aspirin) .Marland Kitchen.. 1 a day 11)  Vit D, B, C  12)  Androgel  .... Per urology 13)  Omega 3  14)  Fenofibrate 160 Mg Tabs (Fenofibrate) .Marland Kitchen.. 1 by mouth once daily  Patient Instructions: 1)  please come back fasting 2)  FLP  AST ALT ------dx high cholesterol 3)  BMP----- dx hypertension 4)  Please schedule a follow-up appointment in 4 months  (yearly exam)

## 2010-09-18 NOTE — Procedures (Signed)
Summary: Colonoscopy/[facility]  Colonoscopy/[facility]   Imported By: Lanelle Bal 07/16/2010 09:20:30  _____________________________________________________________________  External Attachment:    Type:   Image     Comment:   External Document

## 2010-09-18 NOTE — Letter (Signed)
Summary: Health Screening--carotid artery disease?.  [facility]  Health Screening   Imported By: Lanelle Bal 01/11/2010 11:53:02  _____________________________________________________________________  External Attachment:    Type:   Image     Comment:   External Document  Appended Document: Health Screening--carotid artery disease?.  [facility] Health Screening--carotid artery disease?.  Rx [facility]

## 2010-09-18 NOTE — Miscellaneous (Signed)
Summary: Orders Update  Clinical Lists Changes  Problems: Added new problem of CAROTID ARTERY DISEASE (ICD-433.10) Orders: Added new Test order of Carotid Duplex (Carotid Duplex) - Signed 

## 2010-09-18 NOTE — Letter (Signed)
Summary: Patient Notice- Polyp Results  [facility]  [address] [place], [place] [address]   Phone: [phone]  Fax: [phone]        July 16, 2010 MRN: [id number]    [facility] [address] [place], [place]  [address]    Dear [name],  I am pleased to inform you that the colon polyp(s) removed during your recent colonoscopy was (were) found to be benign (no cancer detected) upon pathologic examination.  I recommend you have a repeat colonoscopy examination in 1 year to look for recurrent polyps, as having colon polyps increases your risk for having recurrent polyps or even colon cancer in the future.  Should you develop new or worsening symptoms of abdominal pain, bowel habit changes or bleeding from the rectum or bowels, please schedule an evaluation with either your primary care physician or with me.  Continue treatment plan as outlined the day of your exam.  Please call us if you are having persistent problems or have questions about your condition that have not been fully answered at this time.  Sincerely,  Meryl Dare MD [facility]  This letter has been electronically signed by your physician.  Appended Document: Patient Notice- Polyp Results Letter mailed

## 2010-09-18 NOTE — Letter (Signed)
Summary: [facility]  [facility]   Imported By: Lanelle Bal 07/16/2010 09:16:35  _____________________________________________________________________  External Attachment:    Type:   Image     Comment:   External Document

## 2010-09-18 NOTE — Letter (Signed)
Summary: Pre Visit Letter Revised  [facility]  [address] [place], [place] [address]   Phone: [phone]  Fax: [phone]        05/10/2010 MRN: [id number]   [facility] [place] [address] [place], [place]  [address]              Welcome to the Gastroenterology Division at [facility].    You are scheduled to see a nurse for your pre-procedure visit on May 29, 2010 at 4:30pm on the 3rd floor at [facility], [address]  We ask that you try to arrive at our office 15 minutes prior to your appointment time to allow for check-in.  Please take a minute to review the attached form.  If you answer "Yes" to one or more of the questions on the first page, we ask that you call the person listed at your earliest opportunity.  If you answer "No" to all of the questions, please complete the rest of the form and bring it to your appointment.    Your nurse visit will consist of discussing your medical and surgical history, your immediate family medical history, and your medications.   If you are unable to list all of your medications on the form, please bring the medication bottles to your appointment and we will list them.  We will need to be aware of both prescribed and over the counter drugs.  We will need to know exact dosage information as well.    Please be prepared to read and sign documents such as consent forms, a financial agreement, and acknowledgement forms.  If necessary, and with your consent, a friend or relative is welcome to sit-in on the nurse visit with you.  Please bring your insurance card so that we may make a copy of it.  If your insurance requires a referral to see a specialist, please bring your referral form from your primary care physician.  No co-pay is required for this nurse visit.     If you cannot keep your appointment, please call [phone] to cancel or reschedule prior to your appointment date.  This allows [place] the opportunity  to schedule an appointment for another patient in need of care.    Thank you for choosing [facility] for your medical needs.  We appreciate the opportunity to care for you.  Please visit [place] at our website  to learn more about our practice.  Sincerely, The Gastroenterology Division

## 2010-09-18 NOTE — Progress Notes (Signed)
Summary: next carotid ultrasound  Phone Note Outgoing Call   Summary of Call: health screening January 2011 showed a  question of carotid artery disease. Plan : Carotid ultrasound DX carotid artery disease? Elek Holderness E. Awab Abebe MD  Jan 14, 2010 8:58 AM   discussed with pt ...Marland KitchenMarland KitchenShary Decamp  Jan 16, 2010 11:04 AM

## 2010-09-18 NOTE — Letter (Signed)
Summary: [facility]  [facility]   Imported By: Lanelle Bal 09/25/2009 13:43:24  _____________________________________________________________________  External Attachment:    Type:   Image     Comment:   External Document

## 2010-09-18 NOTE — Progress Notes (Signed)
Summary: drug interaction (lmom 7/18)  Phone Note From Pharmacy Message from:  Fax from Pharmacy on March 05, 2010 8:02 AM  Refills Requested: Medication #1:  SIMVASTATIN 40 MG  TABS 1 by mouth QD [facility] [address] - fax [phone] -   Initial call taken by: Okey Regal [place],  March 05, 2010 8:03 AM [facility]er[facility]  [address] [phone]* Summary of Call: [facility] simvastatin 40 mg - interacting drug amlodipine besylate 5mg  Initial call taken by: Okey Regal [place],  March 05, 2010 8:11 AM  Follow-up for Phone Call        patient is on fenofibrate  and amlodipine although he is doing well, I would recommend to decrease simvastatin to 20 mg daily please let patient know about the change Will reassess his cholesterol on return to the office   Follow-up by: [facility] E. Javeah Loeza MD,  March 05, 2010 10:00 AM  Additional Follow-up for Phone Call Additional follow up Details #1::        Left message with pts wife to have him call us back. Army Fossa CMA  March 05, 2010 10:08 AM     Additional Follow-up for Phone Call Additional follow up Details #2::    Pt is aware. Army Fossa CMA  March 06, 2010 2:04 PM   New/Updated Medications: SIMVASTATIN 20 MG TABS (SIMVASTATIN) 1 by mouth daily. Prescriptions: SIMVASTATIN 20 MG TABS (SIMVASTATIN) 1 by mouth daily.  #30 x 1   Entered by:   Army Fossa CMA   Authorized by:   Nolon Rod. Lynsi Dooner MD   Signed by:   Army Fossa CMA on 03/05/2010   Method used:   [facility]        [phone]* (retail)       [address]       [place], [place]  [id number]       Ph: [phone] or [phone]       Fax: [phone]   RxID:   [id number]

## 2010-09-20 NOTE — Letter (Signed)
Summary: Rx local injection, Dr Ethelene Hal -- Orthopaedics  [facility]Low [facility]   Imported By: Maryln Gottron 08/16/2010 12:54:40  _____________________________________________________________________  External Attachment:    Type:   Image     Comment:   External Document

## 2010-09-20 NOTE — Op Note (Signed)
Summary: Lumbar Epidural Steroid Injection/[facility]  [facility]   Imported By: Lanelle Bal 08/31/2010 11:17:06  _____________________________________________________________________  External Attachment:    Type:   Image     Comment:   External Document

## 2010-09-24 ENCOUNTER — Other Ambulatory Visit: Payer: Self-pay | Admitting: Internal Medicine

## 2010-09-24 ENCOUNTER — Encounter: Payer: Self-pay | Admitting: Internal Medicine

## 2010-09-24 ENCOUNTER — Ambulatory Visit (INDEPENDENT_AMBULATORY_CARE_PROVIDER_SITE_OTHER): Payer: MEDICARE | Admitting: Internal Medicine

## 2010-09-24 DIAGNOSIS — I10 Essential (primary) hypertension: Secondary | ICD-10-CM

## 2010-09-24 DIAGNOSIS — E785 Hyperlipidemia, unspecified: Secondary | ICD-10-CM

## 2010-09-24 DIAGNOSIS — R209 Unspecified disturbances of skin sensation: Secondary | ICD-10-CM

## 2010-09-24 LAB — LIPID PANEL
Cholesterol: 197 mg/dL (ref 0–200)
HDL: 51.5 mg/dL (ref >39.00)
VLDL: 22.8 mg/dL (ref 0.0–40.0)

## 2010-09-24 LAB — BASIC METABOLIC PANEL
CO2: 26 mEq/L (ref 19–32)
Chloride: 106 mEq/L (ref 96–112)
Potassium: 3.8 mEq/L (ref 3.5–5.1)
Sodium: 142 mEq/L (ref 135–145)

## 2010-09-24 LAB — AST: AST: 26 U/L (ref 0–37)

## 2010-09-24 LAB — ALT: ALT: 19 U/L (ref 0–53)

## 2010-10-04 NOTE — Letter (Signed)
Summary: continue Neurontin, hydrocodone p.r.n.---Orthopaedics   Orthopaedics   Imported By: Maryln Gottron 09/25/2010 09:37:50  _____________________________________________________________________  External Attachment:    Type:   Image     Comment:   External Document

## 2010-10-04 NOTE — Assessment & Plan Note (Signed)
Summary: 6 month followup/kn  Nurse Visit   Vital Signs:  Patient profile:   70 year old male Height:      72 inches Weight:      206 pounds BMI:     28.04 Pulse rate:   70 / minute Pulse rhythm:   regular BP sitting:   142 / 80  (left arm) Cuff size:   large  Vitals Entered By: Army Fossa CMA (September 24, 2010 9:40 AM)  Past History:  Past Medical History: HYPERTENSION , Dx aprox 2000 HYPERLIPIDEMIA  DIVERTICULITIS, HX OF 2002,admitted to the hospital one time, status-post two outpatient episodes HYPOGONADISM, MALE  ERECTILE DYSFUNCTION   PARESTHESIAS  LE,  per neurosurgery notes a combination of neuropathy and chronic L5 radiculopathy, on neurontin. used to see  Dr Clarisse Gouge, now sees Dr Ethelene Hal  Prostate cancer, hx of (08/2008)  Past Surgical History: Reviewed history from 10/06/2008 and no changes required. rt shoulder surgery (2008) back surgery (2006) melanoma removed left arm (2005) Prostatectomy (09/13/2008)   Physical Exam  General:  alert and well-developed.   Head:  face symmetric Ears:  R ear normal and L ear normal.   Nose:  not congested Lungs:  normal respiratory effort, no intercostal retractions, no accessory muscle use, and normal breath sounds.   Heart:  normal rate, regular rhythm, and no murmur.   Extremities:  no lower extremity edema   Impression & Recommendations:  Problem # 1:  URI see instructions  Problem # 2:  PARESTHESIA (ICD-782.0) now under the care of Dr. Ethelene Hal He is self discontinue simvastatin 3  weeks ago, apparently  it was making his pain worse.  Problem # 3:  HYPERTENSION (ICD-401.9)  ambulatory blood pressure, usually 120/80  No change His updated medication list for this problem includes:    Amlodipine Besylate 5 Mg Tabs (Amlodipine besylate) .Marland Kitchen... 1 by mouth once daily    Benazepril Hcl 40 Mg Tabs (Benazepril hcl) .Marland Kitchen... 1 by mouth once daily  BP today: 142/80 Prior BP: 136/82 (03/26/2010)  Labs Reviewed: K+:  4.5 (03/26/2010) Creat: : 1.3 (03/26/2010)   Chol: 129 (11/24/2009)   HDL: 36.60 (11/24/2009)   LDL: 66 (11/24/2009)   TG: 130.0 (11/24/2009)  Orders: Venipuncture ([address]) TLB-BMP (Basic Metabolic Panel-BMET) (80048-METABOL) Specimen Handling (34742)  Problem # 4:  HYPERLIPIDEMIA (ICD-272.4)  self discontinued simvastatin 3 weeks ago, patient felt that he was making his chronic leg paresthesia worse The following medications were removed from the medication list:    Simvastatin 20 Mg Tabs (Simvastatin) .Marland Kitchen... 1 by mouth daily. His updated medication list for this problem includes:    Fenofibrate 160 Mg Tabs (Fenofibrate) .Marland Kitchen... 1 by mouth once daily  Orders: TLB-ALT (SGPT) (84460-ALT) TLB-AST (SGOT) (84450-SGOT) TLB-Lipid Panel (80061-LIPID) Specimen Handling (59563)  Complete Medication List: 1)  Fenofibrate 160 Mg Tabs (Fenofibrate) .Marland Kitchen.. 1 by mouth once daily 2)  Amlodipine Besylate 5 Mg Tabs (Amlodipine besylate) .Marland Kitchen.. 1 by mouth once daily 3)  Benazepril Hcl 40 Mg Tabs (Benazepril hcl) .Marland Kitchen.. 1 by mouth once daily 4)  Gabapentin 600 Mg Tabs (Gabapentin) .Marland Kitchen.. 1 by mouth two times a day 5)  Viagra 100 Mg Tabs (Sildenafil citrate) .... As directed 6)  Hydrocodone-acetaminophen 5-500 Mg Tabs (Hydrocodone-acetaminophen) .... Prn 7)  [facility]vs Aspirin Ec 81 Mg Tbec (Aspirin) .Marland Kitchen.. 1 a day 8)  Vit D, B, C  9)  Androgel  .... Per urology 10)  Mvi  11)  Otc Potassium    Patient Instructions: 1)  rest,  fluids, Mucinex DM over-the-counter twice a day. 2)  If not better in a few days, let me know   3)  Please schedule a follow-up appointment in 6 months . fasting, physical exam   History of Present Illness: ROV doing well If the some cough and sputum production 2 weeks ago. Denies fevers, wheezing, runny nose or sore throat. Some nasal congestion       Review of Systems General:  as far as his pain management, he switched to Dr Ethelene Hal , go to the local injection which  helped. HTN--ambulatory BPs-- usually wnl on the 120s HYPERLIPIDEMIA ,  d/c simvastatin 3 weeks ago d/t leg pain (apparently worsened the chronic numbness-neuropathy in the L leg). Feels better   . CV:  Denies chest pain or discomfort and swelling of feet.  CC: 6 month f/u- fasting  Comments c/o coughing x 2 weeks  Stopped simvastatin- states it caused leg pain [facility] [address]    Current Medications (verified): 1)  Fenofibrate 160 Mg Tabs (Fenofibrate) .Marland Kitchen.. 1 By Mouth Once Daily 2)  Amlodipine Besylate 5 Mg  Tabs (Amlodipine Besylate) .Marland Kitchen.. 1 By Mouth Once Daily 3)  Benazepril Hcl 40 Mg  Tabs (Benazepril Hcl) .Marland Kitchen.. 1 By Mouth Once Daily 4)  Gabapentin 600 Mg Tabs (Gabapentin) .Marland Kitchen.. 1 By Mouth Two Times A Day 5)  Viagra 100 Mg  Tabs (Sildenafil Citrate) .... As Directed 6)  Hydrocodone-Acetaminophen 5-500 Mg  Tabs (Hydrocodone-Acetaminophen) .... Prn 7)  [facility]vs Aspirin Ec 81 Mg  Tbec (Aspirin) .Marland Kitchen.. 1 A Day 8)  Vit D, B, C 9)  Androgel .... Per Urology 10)  Mvi 11)  Otc Potassium  Allergies (verified): No Known Drug Allergies  Orders Added: 1)  Venipuncture [36415] 2)  TLB-BMP (Basic Metabolic Panel-BMET) [80048-METABOL] 3)  TLB-ALT (SGPT) [84460-ALT] 4)  TLB-AST (SGOT) [84450-SGOT] 5)  TLB-Lipid Panel [80061-LIPID] 6)  Specimen Handling [99000] 7)  Est. Patient Level III [[address]]

## 2010-10-15 ENCOUNTER — Telehealth: Payer: Self-pay | Admitting: Internal Medicine

## 2010-10-25 NOTE — Progress Notes (Signed)
Summary: Copied of labs  Phone Note Call from Patient Call back at Home Phone [phone]   Summary of Call: Patient called noting that his wife will be here tomorrow for an appt and would like copies of his last two labs printed for her to pick up. He is aware we will place it up front for her.  Initial call taken by: Lucious Groves CMA,  October 15, 2010 3:10 PM

## 2010-11-06 ENCOUNTER — Other Ambulatory Visit: Payer: Self-pay | Admitting: Internal Medicine

## 2010-12-26 ENCOUNTER — Other Ambulatory Visit: Payer: Self-pay | Admitting: Internal Medicine

## 2011-01-01 ENCOUNTER — Encounter: Payer: Self-pay | Admitting: Internal Medicine

## 2011-01-01 NOTE — Op Note (Signed)
NAMEDEAVIN, FORST NO.:  [id number]   MEDICAL RECORD NO.:  [id number]          PATIENT TYPE:  INP   LOCATION:  1434                         FACILITY:  [facility]   PHYSICIAN:  Valetta Fuller, M.D.  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   OPERATION/PROCEDURE:  Robotic-assisted laparoscopic radical retropubic  prostatectomy.   SURGEON:  Valetta Fuller, M.D.   ASSISTANT:  Delia Chimes, N.P.   ANESTHESIA:  General endotracheal.   INDICATIONS:  Mr. Pote is a 70 year old male.  He was diagnosed with  favorable clinical stage T1c adenocarcinoma of the prostate by Dr. Su Grand.  PSA was 8.8.  The patient had ultrasound which revealed a 62-gram  gland.  The patient had two cores positive for a Gleason 3+3=6  adenocarcinoma of the prostate.  The patient has minimal voiding  complaints with an AUA symptom score of 9.  Preoperative sexual  functioning score was 5 out of 25.  The patient underwent extensive  counseling with Dr. Brunilda Payor and then myself with regard to treatment  options.  All options were discussed including active surveillance,  radiation and surgical approaches.  After discussing the various  options, the patient elected to have a robotic prostatectomy.  He  appeared to understand the advantages, disadvantages, potential risks  and complications of the surgery with specific attention towards  discussion of sexual dysfunction and urinary incontinence.  The patient  has gone through the preparatory steps. Has had preoperative physical  therapy and has attended the preoperative conference.  He has had  perioperative Unasyn and placement of compression boots.   DESCRIPTION OF PROCEDURE:   The patient was brought to the operating room  where he had successful induction of general endotracheal anesthesia.  He was placed in the lithotomy position.  All extremities carefully  padded.  The patient was placed in steep Trendelenburg position after  being carefully secured to the operative table.  He was then prepped and  draped in the usual manner.  Foley catheter was placed sterilely on the  field.  Initial camera port was chosen 18 cm above the pubic symphysis,  just to the left of the umbilicus.  A standard open Hasson technique was  utilized with no difficulties placing the trocar or insufflating the  abdomen.  All other trocars were placed with direct visual guidance.  This included 12 mm and 5 mm laparoscopic assist ports and three 8 mm  robotic trocar ports.  After trocar placement, the surgical cart was  docked.  Bladder was filled to help identify the space of Retzius which  was dissected with blunt dissection technique along with electrocautery  scissors.  Superficial fascia over the bladder neck and endopelvic  fascia of the prostate was defatted.  Endopelvic fascia was incised from  base to apex.  Levator musculature was  swept off the apex of the  prostate, isolating the dorsal venous complex which was stapled with the  ETS stapling device.  With a Foley catheter aiding the identification of  the anterior bladder neck, this was transected with hot electrocautery  scissors.  Foley catheter was found in the midline and retracted  anteriorly.  Indigo carmine was given and we were well away from the  ureteral orifices.  Posterior bladder neck was then transected and the  structures including bilateral seminal vesicles and vas deferens were  individually dissected free.  Posterior plane between the prostate and  rectum was then established.   The patient then had nerve sparing performed bilaterally.  Superficial  fascia overlying the prostate was incised on the lateral  aspect of the  prostate and swept posteriorly.  The plane was established between the  posterior lateral aspect of the prostate and the neurovascular bundle  which was then swept from apex to base.  The prostate was then lifted  and the vascular pedicles were taken with Hem-o-lok clips.  Superficial  fascia was swept carefully off the apex of the prostate.  The anterior  urethra was transected.  The Foley catheter was then removed.  Posterior  urethra was then transected leaving a nice urethral stump.  A small  amount rectoureterolysis tissue was sharply divided and the prostate  specimen was brought out of the pelvis.  Copious pelvic irrigation was  then performed and rectal insufflation revealed no evidence of rectal  injury.  The bladder neck did not require any closure.  The bladder neck  and urethra were reapproximated with a 2-0 Vicryl suture.  Once our  reapproximation stitch was in place, which had also incorporated some  Denonvilliers fascia, we went ahead and completed the anastomosis with a  double-armed 3-0 Monocryl suture on a 360-degree running manner.  A new  catheter was placed without difficulty.  The bladder was irrigated.  No  obvious extravasation was noted.  A pelvic drain was placed through one  of the robotic trocars and secured to the abdominal skin.  The 12 mm  trocar site was closed with 0 Vicryl suture with the aid of a suture  passer.  The prostate specimen was placed in an Endopouch retrieval bag.  The camera port incision was extended slightly to allow for the removal  the specimen.  That fascia was closed with a running #1 Vicryl suture.  All trocars were taken out with direct visual guidance.  The patient had  infiltration of Marcaine in all the wound sites and then the skin was  closed with clips.  The bladder appeared to be draining well at the  completion of the procedure.  The patient had no obvious complications  or problems and was brought to the  recovery room in stable condition.  Estimated blood loss approximately 250 mL.      Valetta Fuller, M.D.  Electronically Signed     DSG/MEDQ  D:  08/01/2008  T:  08/02/2008  Job:  [id number]

## 2011-01-04 NOTE — Discharge Summary (Signed)
NAMEROSS, BENDER NO.:  [id number]   MEDICAL RECORD NO.:  [id number]          PATIENT TYPE:  INP   LOCATION:  1434                         FACILITY:  [facility]   PHYSICIAN:  Valetta Fuller, M.D.  DATE OF BIRTH:  1940/12/11   DATE OF ADMISSION:  08/01/2008  DATE OF DISCHARGE:  08/03/2008                               DISCHARGE SUMMARY   ADMISSION DIAGNOSIS:  Favorable clinical stage T1c adenocarcinoma of the  prostate.   DISCHARGE DIAGNOSIS:  Favorable clinical stage T1c adenocarcinoma of the  prostate.   PROCEDURE:  Robotic-assisted laparoscopic radical prostatectomy.   HISTORY AND PHYSICAL:  For full details, please see admission history  and physical.  Briefly, [name] is a 70 year old male who has recently  been diagnosed with favorable clinical stage T1c adenocarcinoma of the  prostate.  After a thorough discussion regarding management options for  treatment, the patient elected to proceed with a robotic-assisted  laparoscopic radical prostatectomy.   HOSPITAL COURSE:  On August 01, 2008, the patient was taken to the  operating room and underwent a robotic-assisted laparoscopic radical  prostatectomy.  He tolerated this procedure well and without  complications.  Postoperatively, he was able to be transferred to a  regular hospital room following recovery from anesthesia.  He was able  to begin ambulation the night of surgery.  He was monitored that evening  and remained hemodynamically stable.  He had good urine output.  On the  morning of postoperative day #1, his hematocrit was checked and found to  be stable at 38.9.  He did have excellent urine output with minimal  output from his pelvic drain.  Therefore, his pelvic drain was removed.  He was placed on a clear liquid diet and continued to ambulate.  He was  reevaluated on the afternoon of postoperative day #1.  His urine output,  blood pressure, and pulse all seemed to be stable.  He was also  able to  tolerate his diet without any nausea or vomiting.  He did complain of  abdominal pain.  His bowel sounds were positive.  His abdomen was  slightly distended.  He was given medications for this, which did not  help to relieve this pain.  Therefore, it was decided that he would stay  overnight for evaluation due to a history of diverticulitis.  On the  morning of postoperative day #2, he was found to be doing well with less  abdominal pain and distention.  He was able to tolerate his fluids.  Therefore, he was felt stable for discharge and had met all discharge  criteria.   DISPOSITION:  Home.   DISCHARGE MEDICATIONS:  He was instructed to resume his normal home  medications consisting of:  1. Amlodipine.  2. Benazepril.  3. Cyclobenzaprine.  4. Gabapentin.  5. Simvastatin.  6. Zolpidem.   He was instructed to resume his aspirin, multivitamins and herbal  medications in 10 days after discharge from the hospital.  In addition,  he was provided a prescription for Vicodin to take as needed for pain.  He was told  to use Colace as a stool softener.  He was given a  prescription for Cipro to begin 2 days prior to his return visit for  Foley catheter removal.   DISCHARGE INSTRUCTIONS:  He was instructed to be ambulatorybut  specifically told to refrain from any heavy lifting, strenuous activity,  or driving.  He was instructed on Foley catheter careand told to  gradually increase his diet over the course of the next few days.   FOLLOWUP:  He will follow up in 1 week for Foley catheter removal and  skin staple removal.      Martin Chimes, NP      Valetta Fuller, M.D.  Electronically Signed    MA/MEDQ  D:  08/04/2008  T:  08/04/2008  Job:  [id number]

## 2011-01-04 NOTE — Op Note (Signed)
NAMEGLENDAL, Castro                 ACCOUNT NO.:  [id number]   MEDICAL RECORD NO.:  [id number]          PATIENT TYPE:  AMB   LOCATION:  SDS                          FACILITY:  [facility]   PHYSICIAN:  Danae Orleans. Venetia Maxon, M.D.  DATE OF BIRTH:  03/01/1941   DATE OF PROCEDURE:  07/26/2005  DATE OF DISCHARGE:                                 OPERATIVE REPORT   PREOPERATIVE DIAGNOSIS:  Left L4-L5 disc herniation with spondylosis,  degenerative disc disease, and radiculopathy.   POSTOPERATIVE DIAGNOSIS:  Left L4-L5 disc herniation with spondylosis,  degenerative disc disease, and radiculopathy.   PROCEDURE:  Left L4-L5 microdiscectomy with microdissection.   SURGEON:  Danae Orleans. Venetia Maxon, M.D.   ASSISTANT:  Clydene Fake, M.D.   ANESTHESIA:  General endotracheal anesthesia.   ESTIMATED BLOOD LOSS:  Minimal.   COMPLICATIONS:  None.   DISPOSITION:  Recovery room.   INDICATIONS FOR PROCEDURE:  [name] is a 70 year old man with a left L4-  L5 disc herniation with significant left L5 radiculopathy.  He had a free  fragment of herniated disc material appreciated on MRI with significant L5  nerve root compression.  It was elected to take him to surgery for  microdiscectomy.   DESCRIPTION OF PROCEDURE:  [name] was brought to the operating room.  Following satisfactory uncomplicated induction of general endotracheal  anesthesia, the patient was placed in a prone position on the Wilson frame.  His low back was then prepped and draped in the usual sterile fashion.  The  area of planned incision was infiltrated with 0.25% Marcaine and 0.5%  lidocaine with 1:200,000 epinephrine.  An incision was made in the midline  and carried through the subcutaneous tissues to the lumbodorsal fascia which  was incised on the left side of the midline.  Subperiosteal dissection was  performed exposing  the L4-L5 interspace.  A marker was placed at this level  and interoperative x-ray confirmed correct  orientation.  Subsequently,  hemilaminectomy of L4 was performed with a high speed drill and Kerrison  rongeurs and the foraminotomy was performed overlying the L5 nerve root.  The microscope was brought onto the field and using microdissection  technique, the lateral thecal sac and L5 nerve root were mobilized medially.  A thin contained free fragment of herniated disc material was identified and  this was directly beneath the L5 nerve root.  Using a micropituitary, a  large fragment of herniated disc material was removed with significant  decompression of the nerve root.  This did not appear to extend into the  interspace and there did not appear to be any residual disc material causing  nerve root compression.  It was consequently elected not to open the annulus  or to explore further.  A coronary dilator was easily inserted along the  floor of the canal and out the neural foramen.  Hemostasis was assured with  Gelfoam soaked in thrombin.  The wound was irrigated and infiltrated with 2  mL of Depo-Medrol and Fentany8l.  The self-retaining retractor was removed.  The lumbodorsal fascia was closed with 0 Vicryl  sutures, the subcutaneous  tissues were reapproximated with 2-0 Vicryl interrupted inverted sutures,  and the skin edges were reapproximated with interrupted 3-0 Vicryl  subcuticular stitch.  The wound was dressed with Dermabond.  The patient was  extubated in the operating room and taken to the recovery room in stable  condition having tolerated the operation well.  Counts were correct at the  end of the case.      Danae Orleans. Venetia Maxon, M.D.  Electronically Signed     JDS/MEDQ  D:  07/26/2005  T:  07/27/2005  Job:  [id number]

## 2011-01-08 ENCOUNTER — Other Ambulatory Visit: Payer: Self-pay | Admitting: *Deleted

## 2011-01-08 NOTE — Telephone Encounter (Signed)
He used to take Ambien, it was discontinued but  okay to refill if patient feels it is needed.  Call #[phone], 1 refill

## 2011-01-09 MED ORDER — ZOLPIDEM TARTRATE 10 MG PO TABS: 10.0000 mg | ORAL_TABLET | Freq: Every evening | ORAL | Status: DC | PRN

## 2011-01-09 NOTE — Telephone Encounter (Signed)
Pt states he is taking

## 2011-01-11 ENCOUNTER — Other Ambulatory Visit: Payer: Self-pay | Admitting: Internal Medicine

## 2011-03-25 ENCOUNTER — Other Ambulatory Visit: Payer: Self-pay | Admitting: Internal Medicine

## 2011-04-01 ENCOUNTER — Encounter: Payer: MEDICARE | Admitting: Internal Medicine

## 2011-04-01 ENCOUNTER — Ambulatory Visit (INDEPENDENT_AMBULATORY_CARE_PROVIDER_SITE_OTHER): Payer: Medicare Other | Admitting: Internal Medicine

## 2011-04-01 ENCOUNTER — Encounter: Payer: Self-pay | Admitting: Internal Medicine

## 2011-04-01 DIAGNOSIS — I10 Essential (primary) hypertension: Secondary | ICD-10-CM

## 2011-04-01 DIAGNOSIS — E291 Testicular hypofunction: Secondary | ICD-10-CM

## 2011-04-01 DIAGNOSIS — E785 Hyperlipidemia, unspecified: Secondary | ICD-10-CM

## 2011-04-01 LAB — AST: AST: 29 U/L (ref 0–37)

## 2011-04-01 LAB — LIPID PANEL
Cholesterol: 200 mg/dL (ref 0–200)
HDL: 54.2 mg/dL (ref >39.00)
LDL Cholesterol: 115 mg/dL — ABNORMAL HIGH (ref 0–99)
VLDL: 31.2 mg/dL (ref 0.0–40.0)

## 2011-04-01 NOTE — Assessment & Plan Note (Signed)
Well controlled  Labs

## 2011-04-01 NOTE — Progress Notes (Signed)
  Subjective:    Patient ID: [name], male    DOB: 03-28-1941, 70 y.o.   MRN: [id number]  HPI Routine office visit, no major concerns  Past Medical History:  HYPERTENSION , Dx aprox 2000  HYPERLIPIDEMIA  DIVERTICULITIS, HX OF 2002,admitted to the hospital one time, status-post two outpatient episodes  HYPOGONADISM, MALE  ERECTILE DYSFUNCTION  PARESTHESIAS LE, per neurosurgery notes a combination of neuropathy and chronic L5 radiculopathy, on neurontin. used to see Dr Clarisse Gouge, now sees Dr Ethelene Hal  Prostate cancer, hx of (08/2008)    Past Surgical History:  Reviewed history from 10/06/2008 and no changes required.  rt shoulder surgery (2008)  back surgery (2006)  melanoma removed left arm (2005)  Prostatectomy (09/13/2008)     Review of Systems Occasionally the urine is more concentrated than usual, no dysuria, gross hematuria or pink urine. Good medication compliance with Pravachol, no myalgias Ambulatory blood pressures are as from 110-130. In general he feels well. He is taking AndroGel as directed by his urologist.     Objective:   Physical Exam  Constitutional: He is oriented to person, place, and time. He appears well-developed.  Cardiovascular: Normal rate, regular rhythm and intact distal pulses.   No murmur heard. Pulmonary/Chest: Effort normal and breath sounds normal. No respiratory distress. He has no wheezes. He has no rales.  Musculoskeletal: He exhibits no edema.  Neurological: He is alert and oriented to person, place, and time.          Assessment & Plan:

## 2011-04-01 NOTE — Assessment & Plan Note (Signed)
Per urology , on HRT

## 2011-04-01 NOTE — Assessment & Plan Note (Signed)
Due to aches pt self discontinue simva several months ago, now on pravachol and no s/e: labs

## 2011-04-04 ENCOUNTER — Telehealth: Payer: Self-pay | Admitting: *Deleted

## 2011-04-04 MED ORDER — PRAVASTATIN SODIUM 40 MG PO TABS: 40.0000 mg | ORAL_TABLET | Freq: Every day | ORAL | Status: DC

## 2011-04-04 NOTE — Telephone Encounter (Signed)
Pt informed & new Rx available. Pt states that family Hx of heart disease, but no deaths at early age (father 60 & brother 30s0

## 2011-04-04 NOTE — Telephone Encounter (Signed)
Message copied by Regis Bill on Thu Apr 04, 2011  4:34 PM ------      Message from: Willow Ora E      Created: Wed Apr 03, 2011  3:56 PM       Advise patient:      Cholesterol is okay but needs to be better, LDL is 115, giving his family history of heart disease he likes this number to be around 100.      Increase Pravachol to 40 mg daily, called in a prescription.      Other medications: no change

## 2011-04-09 ENCOUNTER — Other Ambulatory Visit: Payer: Self-pay | Admitting: Internal Medicine

## 2011-04-09 NOTE — Telephone Encounter (Signed)
Rx Done . 

## 2011-05-12 ENCOUNTER — Other Ambulatory Visit: Payer: Self-pay | Admitting: Internal Medicine

## 2011-05-13 NOTE — Telephone Encounter (Signed)
Done

## 2011-05-16 ENCOUNTER — Ambulatory Visit (INDEPENDENT_AMBULATORY_CARE_PROVIDER_SITE_OTHER): Payer: Medicare Other | Admitting: *Deleted

## 2011-05-16 VITALS — Temp 97.9°F

## 2011-05-16 DIAGNOSIS — Z23 Encounter for immunization: Secondary | ICD-10-CM

## 2011-05-24 LAB — CBC
HCT: 38.9 % — ABNORMAL LOW (ref 39.0–52.0)
Platelets: 134 10*3/uL — ABNORMAL LOW (ref 150–400)
RDW: 13.8 % (ref 11.5–15.5)

## 2011-05-24 LAB — BASIC METABOLIC PANEL
BUN: 5 mg/dL — ABNORMAL LOW (ref 6–23)
CO2: 27 mEq/L (ref 19–32)
Calcium: 7.9 mg/dL — ABNORMAL LOW (ref 8.4–10.5)
Chloride: 109 mEq/L (ref 96–112)
GFR calc Af Amer: >60 mL/min (ref >60)
GFR calc non Af Amer: >60 mL/min (ref >60)
Glucose, Bld: 171 mg/dL — ABNORMAL HIGH (ref 70–99)
Potassium: 4 mEq/L (ref 3.5–5.1)

## 2011-05-24 LAB — HEMOGLOBIN AND HEMATOCRIT, BLOOD
HCT: 42.4 % (ref 39.0–52.0)
Hemoglobin: 14.6 g/dL (ref 13.0–17.0)

## 2011-05-24 LAB — DIFFERENTIAL
Basophils Absolute: 0 10*3/uL (ref 0.0–0.1)
Basophils Relative: 0 % (ref 0–1)
Eosinophils Relative: 1 % (ref 0–5)
Lymphocytes Relative: 9 % — ABNORMAL LOW (ref 12–46)
Neutro Abs: 7.6 10*3/uL (ref 1.7–7.7)

## 2011-05-24 LAB — TYPE AND SCREEN: Antibody Screen: NEGATIVE

## 2011-06-02 ENCOUNTER — Other Ambulatory Visit: Payer: Self-pay | Admitting: Internal Medicine

## 2011-06-03 NOTE — Telephone Encounter (Signed)
Done

## 2011-06-27 ENCOUNTER — Other Ambulatory Visit: Payer: Self-pay | Admitting: Dermatology

## 2011-07-19 ENCOUNTER — Other Ambulatory Visit: Payer: Self-pay | Admitting: Internal Medicine

## 2011-08-06 ENCOUNTER — Encounter (HOSPITAL_COMMUNITY): Payer: Self-pay | Admitting: Pharmacy Technician

## 2011-08-09 ENCOUNTER — Other Ambulatory Visit (HOSPITAL_COMMUNITY): Payer: Medicare Other

## 2011-08-09 ENCOUNTER — Ambulatory Visit (HOSPITAL_COMMUNITY)
Admission: RE | Admit: 2011-08-09 | Discharge: 2011-08-09 | Disposition: A | Discharge status: Home / Self Care | Payer: Medicare Other | Source: Ambulatory Visit | Attending: Physician Assistant | Admitting: Physician Assistant

## 2011-08-09 ENCOUNTER — Other Ambulatory Visit: Payer: Self-pay

## 2011-08-09 ENCOUNTER — Encounter (HOSPITAL_COMMUNITY)
Admission: RE | Admit: 2011-08-09 | Discharge: 2011-08-09 | Disposition: A | Discharge status: Home / Self Care | Payer: Medicare Other | Source: Ambulatory Visit | Attending: Orthopedic Surgery | Admitting: Orthopedic Surgery

## 2011-08-09 ENCOUNTER — Telehealth: Payer: Self-pay | Admitting: *Deleted

## 2011-08-09 ENCOUNTER — Encounter (HOSPITAL_COMMUNITY): Payer: Self-pay

## 2011-08-09 DIAGNOSIS — Z01810 Encounter for preprocedural cardiovascular examination: Secondary | ICD-10-CM | POA: Insufficient documentation

## 2011-08-09 DIAGNOSIS — Z01818 Encounter for other preprocedural examination: Secondary | ICD-10-CM | POA: Insufficient documentation

## 2011-08-09 DIAGNOSIS — Z01812 Encounter for preprocedural laboratory examination: Secondary | ICD-10-CM | POA: Insufficient documentation

## 2011-08-09 HISTORY — DX: Essential (primary) hypertension: I10

## 2011-08-09 HISTORY — DX: Headache: R51

## 2011-08-09 LAB — DIFFERENTIAL
Basophils Absolute: 0 K/uL (ref 0.0–0.1)
Basophils Relative: 0 % (ref 0–1)
Eosinophils Absolute: 0.2 K/uL (ref 0.0–0.7)
Eosinophils Relative: 3 % (ref 0–5)
Lymphocytes Relative: 30 % (ref 12–46)
Lymphs Abs: 2 K/uL (ref 0.7–4.0)
Monocytes Absolute: 0.5 K/uL (ref 0.1–1.0)
Monocytes Relative: 8 % (ref 3–12)
Neutro Abs: 3.9 K/uL (ref 1.7–7.7)
Neutrophils Relative %: 59 % (ref 43–77)

## 2011-08-09 LAB — COMPREHENSIVE METABOLIC PANEL
ALT: 18 U/L (ref 0–53)
AST: 23 U/L (ref 0–37)
Albumin: 3.9 g/dL (ref 3.5–5.2)
Alkaline Phosphatase: 55 U/L (ref 39–117)
BUN: 16 mg/dL (ref 6–23)
Chloride: 105 mEq/L (ref 96–112)
Potassium: 4.2 mEq/L (ref 3.5–5.1)
Sodium: 140 mEq/L (ref 135–145)
Total Bilirubin: 0.4 mg/dL (ref 0.3–1.2)

## 2011-08-09 LAB — PROTIME-INR: Prothrombin Time: 12.6 seconds (ref 11.6–15.2)

## 2011-08-09 LAB — CBC
HCT: 45.7 % (ref 39.0–52.0)
RDW: 13 % (ref 11.5–15.5)
WBC: 6.7 10*3/uL (ref 4.0–10.5)

## 2011-08-09 LAB — SURGICAL PCR SCREEN: Staphylococcus aureus: POSITIVE — AB

## 2011-08-09 NOTE — Telephone Encounter (Signed)
Got it few days ago, schedule a OV 12-26 for clearance

## 2011-08-09 NOTE — Pre-Procedure Instructions (Signed)
Sent fax to [phone] requesting clarification of order for consent- no procedure added- confirmation received  08/09/11

## 2011-08-09 NOTE — Patient Instructions (Signed)
20 JANCE SIEK  08/09/2011   Your procedure is scheduled on: 08/16/11    Surgery 1300-1500   Friday Report to [facility] at 1030 AM.  Call this number if you have problems the morning of surgery: [phone]      PST [phone]   Remember:   Do not eat food:After Midnight. Thursday NIGHT  May have clear liquids:until Midnight . Thursday NIGHT  Clear liquids include soda, tea, black coffee, apple or grape juice, broth.  Take these medicines the morning of surgery with A SIP OF WATER:NORVASC and GABOPENTIN WITH SIP WATER   Do not wear jewelry, make-up or nail polish.  Do not wear lotions, powders, or perfumes. You may wear deodorant.  Do not shave 48 hours prior to surgery.  Do not bring valuables to the hospital.  Contacts, dentures or bridgework may not be worn into surgery.  Leave suitcase in the car. After surgery it may be brought to your room.  For patients admitted to the hospital, checkout time is 11:00 AM the day of discharge.   Patients discharged the day of surgery will not be allowed to drive home.  Name and phone number of your driver:wife  Special Instructions: CHG Shower Use Special Wash: 1/2 bottle night before surgery and 1/2 bottle morning of surgery.  REGULAR SOAP FACE AND PRIVATES   Please read over the following fact sheets that you were given: MRSA Information

## 2011-08-09 NOTE — Telephone Encounter (Signed)
Pt wife left VM that Pt is schedule to have surgery with Trudie Reed and they have yet to get form faxed back stating it was ok. Please advise have you received form.

## 2011-08-09 NOTE — Telephone Encounter (Signed)
Discuss with patient wife appt scheduled.

## 2011-08-09 NOTE — H&P (Addendum)
[name] 08/09/2011 9:16 AM Location: [facility] Patient #: [id number] DOB: 08-04-1941 Married / Language: [place] / Race: White Male   History of Present Illness(Sharon Gillian Shields; 08/09/2011 12:22 PM) The patient is a 70 year old male who comes in today for a preoperative History and Physical. The patient is scheduled for a lumbar decpmpression L4-5, L5-S1 with foraminotomy to be performed by Dr. Debria Garret D. Shon Baton, MD at [facility] on Friday, August 16, 2011 at 1:00pm .    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 10/02/1992 Pain in joint, lower leg (719.46). 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 03/19/2001 Synovitis NEC (727.09). 11-09-202002 Sprain/strain, knee/leg NOS (844.9). 11-09-202002 Sprain/strain, hand NOS (842.10). 07/15/2005 Disorder, shoulder region NEC (726.2). 04/23/2007 Syndrome, rotator cuff NOS (726.10). 04/23/2007 Lumbago (724.2). 12/05/2010   Allergies(DIANA J KOVACH, PA-C; 08/09/2011 9:24 AM) No Known Drug Allergies. 07/09/2011   Family History(DIANA J KOVACH, PA-C; 08/09/2011 9:24 AM) Diabetes Mellitus. mother, sister and brother Hypertension. sister and brother Osteoarthritis. mother Cancer. mother and brother Depression. brother   Social History(DIANA J [facility], PA-C; 08/09/2011 9:24 AM) Tobacco use Tobacco use   Medication History(Sharon Gillian Shields; 08/09/2011 12:21 PM) Hydrocodone-Acetaminophen (5-500MG  Tablet, Oral one po qhs, Taken starting 07/31/2011) Active. Neurontin (300MG  Capsule, 1 Oral two times daily, Taken starting 07/29/2011) Active. AmLODIPine Besylate (5MG  Tablet, Oral) Active. AndroGel (50MG /5GM Gel, Transdermal) Active. Benazepril HCl (40MG  Tablet, Oral) Active. Fenofibrate (160MG  Tablet, Oral)  Active. Zolpidem Tartrate (10MG  Tablet, Oral) Active.   Past Surgical History(DIANA J [facility], PA-C; 08/09/2011 9:24 AM) Colon Polyp Removal - Colonoscopy Other Surgery. chip removed from Sciatic Nerve Prostatectomy; Abdominal Arthroscopy of Knee. right Arthroscopy of Shoulder. right Tonsillectomy Vasectomy   Other Problems(DIANA J KOVACH, PA-C; 08/09/2011 9:24 AM) Peripheral Neuropathy Prostate Cancer Prostate Disease Hypercholesterolemia Chronic Pain Diverticulitis Of Colon High blood pressure Unspecified Diagnosis Skin Cancer Sleep Apnea   Review of Systems(DIANA J [facility], PA-C; 08/09/2011 9:24 AM) General:Present- Fatigue. Not Present- Chills, Fever, Night Sweats, Appetite Loss, Feeling sick, Weight Gain and Weight Loss. Skin:Not Present- Itching, Rash, Skin Color Changes, Ulcer, Psoriasis and Change in Hair or Nails. HEENT:Present- Sensitivity to light, Decreased Hearing and Ringing in the Ears. Not Present- Nose Bleed and Visual Loss. Neck:Not Present- Swollen Glands and Neck Mass. Respiratory:Present- Snoring. Not Present- Shortness of breath, Chronic Cough and Bloody sputum. Cardiovascular:Present- Swelling of Extremities and Leg Cramps. Not Present- Shortness of Breath, Chest Pain and Palpitations. Gastrointestinal:Not Present- Bloody Stool, Heartburn, Abdominal Pain, Vomiting, Nausea and Incontinence of Stool. Male Genitourinary:Present- Nocturia. Not Present- Blood in Urine, Frequency and Incontinence. Musculoskeletal:Present- Joint Stiffness, Joint Swelling and Back Pain. Not Present- Muscle Weakness, Muscle Pain and Joint Pain. Neurological:Present- Tingling and Numbness. Not Present- Burning, Tremor, Headaches and Dizziness. Psychiatric:Not Present- Anxiety, Depression and Memory Loss. Endocrine:Not Present- Cold Intolerance, Heat Intolerance, Excessive hunger and Excessive Thirst. Hematology:Not Present- Abnormal Bleeding, Abnormal bruising,  Anemia and Blood Clots.   Vitals(Sharon Gillian Shields; 08/09/2011 12:02 PM) 08/09/2011 12:00 PM Weight: 205 lb Height: 72 in Body Surface Area: 2.17 m Body Mass Index: 27.8 kg/m BP: 129/75 (Sitting, Left Arm, Standard)    Physical Exam(DIANA J KOVACH, PA-C; 08/09/2011 12:40 PM) The physical exam findings are as follows:   General General Appearance- pleasant. Not in acute distress. Orientation- Oriented X3. Build & Nutrition- Well nourished and Well developed. Posture- Normal posture. Gait- Normal. Mental Status- Alert.  Integumentary Lumbar Spine- Skin examination of the lumbar spine is without deformity, skin lesions, lacerations or abrasions.   Head and Neck Neck Global Assessment- supple. no lymphadenopathy and no nucchal rigidty.   Eye Pupil- Bilateral- Normal, Direct reaction to light normal, Equal and Regular. Motion- Bilateral- EOMI.   Chest and Lung Exam Auscultation: Breath sounds:- Clear.   Cardiovascular Auscultation:Rhythm- Regular rate and rhythm. Heart Sounds- Normal heart sounds.   Abdomen Palpation/Percussion:Palpation and Percussion of the abdomen reveal - Non Tender, No Rebound tenderness and Soft.   Peripheral Vascular Lower Extremity:Inspection- Bilateral- Inspection Normal. Palpation:Posterior tibial pulse- Bilateral- 2+. Dorsalis pedis pulse- Bilateral- 2+.   Neurologic Sensation:Lower Extremity- Left- sensation is diminished in the lower extremity (L5 dermatome diminished to light touch). Bilateral- sensation is intact in the lower extremity. Reflexes:Patellar Reflex- Bilateral- 1+. Achilles Reflex- Bilateral- 1+. Babinski- Bilateral- Babinski not present. Clonus- Bilateral- clonus not present. Hoffman's Sign- Bilateral- Hoffman's sign not present. Testing:Seated Straight Leg Raise- Bilateral- Seated straight leg raise  negative.   Musculoskeletal Spine/Ribs/Pelvis Lumbosacral Spine:Inspection and Palpation- Tenderness- generalized. bony and soft tissue palpation of the lumbar spine and SI joint does not recreate their typical pain. Strength and Tone: Strength:Hip Flexion- Bilateral- 5/5. Knee Extension- Bilateral- 5/5. Knee Flexion- Bilateral- 5/5. Ankle Dorsiflexion- Bilateral- 5/5. Ankle Plantarflexion- Bilateral- 5/5. Heel walk- Bilateral- able to heel walk without difficulty. Toe Walk- Bilateral- able to walk on toes without difficulty. Heel-Toe Walk- Bilateral- able to heel-toe walk without difficulty. ROM- Flexion- full range of motion. Extension- full range of motion. Pain:- extension is more painful than flexion. Waddell's Signs- no Waddell's signs present. Lower Extremity Range of Motion:- No true hip, knee or ankle pain with range of motion. Gait and Station:Assistive Devices- no assistive devices.   Assessment & Plan(DIANA J [facility], PA-C; 08/09/2011 12:40 PM) Lumbago (724.2)  Spinal stenosis (724.00)  Note: Unfortunately, conservative measures including observation, activity modifications, physical therapy, oral pain medications and injection therapy have failed to alleviate his symptoms and because of the ongoing nature of his pain and the decrease in his quality of life he has elected to proceed with surgery.   MRI of the lumbar spine dated 12/11/13 demonstrates L4-5 postsurgical changes. Moderate to severe foraminal stenosis. Moderate lateral recess stenosis. L5-S1 annular disc tear. Moderate to severe right and moderate left foraminal stenosis  Risks/benefits/alternatives to surgery and expectations following surgery have been reviewed with the patient by Dr. Shon Baton. He has not yet completed his pre-op hospital requirements. He has not yet been fitted for a lumbar corset brace and I have informed him that our physical therapy department should be  contacting him to get him fitted for this. He knows to bring the brace the day of surgery.   Plan at this time is to proceed with surgery as scheduled. All of his questions were encouraged, addressed and answered.   Signed electronically by Gwinda Maine, PA-C (08/09/2011 12:40 PM)  [name] 08/05/2011 10:49 AM Location: [facility] Patient #: [id number] DOB: [birth year] Married / Language: [place] / Race: White Male   History of Present Illness([name]; 08/05/2011 10:53 AM) The patient is a 70 year old male who presents today for follow up of their back. The patient is being followed for their left-sided back pain. Symptoms reported today include: pain (about the left lower lumbar region with cramping about bilat. feet ) and numbness (radiating into the left lower ext. to the level of the foot). The patient states that they are doing poorly. Current  treatment includes: pain medications (as well as Neurontin ). The following medication has been used for pain control: Hydrocodone. The patient presents today following MRI (performed at [facility] ).    Subjective Transcription(Amaurie Wandel Sheela Stack, MD; 08/09/2011 9:13 AM)  The patient and his wife are present for the dictation. He is referred to me by Dr. Ethelene Hal for evaluation of spinal stenosis.  Briefly, the patient is a 45 -year-old gentleman who six years ago underwent a lumbar diskectomy at L4-5 for left leg pain. He states that for about six to eight months he did quite well but then started becoming more active. Over the last four to five years he has been having progressive back pain, buttock pain radiation into the left leg and into the right foot. He states that his overall quality of life is deteriorating. He has been treated with physical therapy. He has had epidural injections in the past which have been helpful for about three months at a time. He states that however at this point he really does not want to continue  having injections and if something surgical can be done, he would appropriate it. His last injection was a left S1 selective nerve root block and his previous injections were 4-5 selective nerve root block and a straightforward epidural steroid injection.    Allergies([name]; 08/05/2011 10:53 AM) No Known Drug Allergies. 07/09/2011   Social History([name]; 08/05/2011 10:54 AM) Tobacco use   Medication History([name]; 08/05/2011 10:55 AM) Hydrocodone-Acetaminophen (5-500MG  Tablet, Oral one po q 6 hrs prn pain, Taken starting 07/31/2011) Active. Neurontin (300MG  Capsule, 1 Oral two times daily, Taken starting 07/29/2011) Active.   Objective Transcription(Derek Huneycutt Sheela Stack, MD; 08/09/2011 9:13 AM)  On examination, he is a pleasant gentleman who appears his stated age in no acute distress. He is alert and oriented times three.  He has a well-healed lumbar incision. He has pain with extension of the spine, relief with forward flexion of the spine. He has a cramping kind of hamstring pain only on the left. He is able to heel and toe walk without weakness. He has a negative straight leg raise test. Compartments are soft and nontender. Intact peripheral pulses throughout.  Respiratory, no shortness of breath or chest pain.  Abdomen, soft and nontender.  IMAGES:  The patient's MRI was reviewed dated July 19, 2011. He has moderate-to-severe foraminal stenosis and lateral recessed stenosis at L4-S1 with impingement on the exiting L5 nerve root in the foramen. He also has moderate-to-severe right greater than left foraminal stenosis at L4-5.    Assessments Transcription(Caelin Rosen D Nikolos Billig, MD; 08/09/2011 9:13 AM)  At this point in time, I do believe the patient's principal problem is foraminal stenosis.    Plans Transcription(Rickeya Manus D Parthiv Mucci, MD; 08/09/2011 9:13 AM)  In order to address this, I would need to do a lumbar decompression L4-5, L5-S1 and  foraminotomy. Even though clinically the greatest amount of radicular neuropathic leg pain is on the left side, my concern is that if I were to just do the left, that the right could become symptomatic. Therefore, in an attempt to decrease the need for subsequent spinal surgery and to increase the overall positive results I would recommend a decompression L4-5, L5-S1. We reviewed the risks which include infection, bleeding, nerve damage, death, stroke, paralysis, bleeding, blood clots, need for further surgery, ongoing or worse pain, loss of quality of life. All of the patient's questions were addressed. We have gone over the surgical procedure.  Miscellaneous Transcription(Jenay Morici Sheela Stack, MD; 08/09/2011 9:13 AM)  Debria Garret D. Shon Baton, MD/kro  T: 08/07/2011  D: 08/05/2011    Signed electronically by Alvy Beal, MD (08/05/2011 4:13 PM) Foraminal stenosis with radicular leg pain History and physical reviewed Plan on decompression L4-S1 patient was  Re-examined

## 2011-08-14 ENCOUNTER — Ambulatory Visit (INDEPENDENT_AMBULATORY_CARE_PROVIDER_SITE_OTHER): Payer: Medicare Other | Admitting: Internal Medicine

## 2011-08-14 ENCOUNTER — Encounter: Payer: Self-pay | Admitting: Internal Medicine

## 2011-08-14 VITALS — BP 134/72 | HR 71 | Temp 98.1°F | Wt 216.6 lb

## 2011-08-14 DIAGNOSIS — G4700 Insomnia, unspecified: Secondary | ICD-10-CM | POA: Insufficient documentation

## 2011-08-14 DIAGNOSIS — M1990 Unspecified osteoarthritis, unspecified site: Secondary | ICD-10-CM

## 2011-08-14 DIAGNOSIS — I10 Essential (primary) hypertension: Secondary | ICD-10-CM

## 2011-08-14 MED ORDER — METOPROLOL TARTRATE 25 MG PO TABS: 25.0000 mg | ORAL_TABLET | Freq: Two times a day (BID) | ORAL | Status: DC

## 2011-08-14 MED ORDER — ALPRAZOLAM 0.5 MG PO TABS: 0.5000 mg | ORAL_TABLET | Freq: Every evening | ORAL | Status: AC | PRN

## 2011-08-14 NOTE — Pre-Procedure Instructions (Signed)
Late entry for 08/09/11- Called Cordelia Pen schedular with Dr Shon Baton requesting clarification of operative procedure for consent and also faxed request with confirmation received.  08/14/11  Spoke with Cordelia Pen who stated she has forwarded this to Jeanella Cara each request. I paged Lafonda Mosses at [phone] 08/14/11 with no response.   I spoke with main desk at office who took my info and will forward to another provider in office as Lafonda Mosses is in OR   L Camera operator

## 2011-08-14 NOTE — Assessment & Plan Note (Signed)
Well controlled 

## 2011-08-14 NOTE — Assessment & Plan Note (Addendum)
Ongoing back pain, to have surgery in 2 days. Review of systems and physical exam did not point to any concern for CHF, CAD. Chart review--->  had a normal carotid ultrasound 01/2010 Chest x-ray, recent labs and  EKG done for his preop are essentially at baseline. Patient is cleared, I do recommend perioperative beta blockers, seen instructions. We will fax this note to his surgeon

## 2011-08-14 NOTE — Progress Notes (Signed)
  Subjective:    Patient ID: [name], male    DOB: June 06, 1941, 70 y.o.   MRN: [id number]  HPI Scheduled to have back surgery in a couple of days, here for clearance. Besides the back pain, he is doing well. Also, would like Ambien to be changed, states it made him do "unusual things"  Past Medical History: HYPERTENSION , Dx aprox 2000 HYPERLIPIDEMIA  DIVERTICULITIS, HX OF 2002,admitted to the hospital one time, status-post two outpatient episodes HYPOGONADISM, MALE  ERECTILE DYSFUNCTION   PARESTHESIAS  LE,  per neurosurgery notes a combination of neuropathy and chronic L5 radiculopathy, on neurontin. used to see  Dr Clarisse Gouge, now sees Dr Ethelene Hal  Prostate cancer, hx of (08/2008)  Past Surgical History: rt shoulder surgery (2008) back surgery (2006) melanoma removed left arm (2005) Prostatectomy (09/13/2008)   Review of Systems Denies chest pain or shortness of breath. Rarely has lower extremity edema. He does not have dyspnea on exertion, the only limitant to his physical activity is back pain. No nausea, vomiting, diarrhea.     Objective:   Physical Exam  Constitutional: He is oriented to person, place, and time. He appears well-developed and well-nourished.  HENT:  Head: Normocephalic and atraumatic.  Cardiovascular: Normal rate, regular rhythm and normal heart sounds.   No murmur heard. Pulmonary/Chest: Effort normal and breath sounds normal. No respiratory distress. He has no wheezes. He has no rales.  Abdominal: Soft. Bowel sounds are normal. He exhibits no distension. There is no tenderness. There is no rebound and no guarding.  Musculoskeletal: He exhibits no edema.  Neurological: He is alert and oriented to person, place, and time.  Skin: Skin is warm and dry.  Psychiatric: He has a normal mood and affect. His behavior is normal. Judgment and thought content normal.      Assessment & Plan:

## 2011-08-14 NOTE — Assessment & Plan Note (Signed)
Did not like Ambien, will switch to Xanax

## 2011-08-14 NOTE — Patient Instructions (Signed)
Take metoprolol 1 tablet twice a day starting the day of the surgery for 2 days Discuss this with your surgeon and anesthesiologist

## 2011-08-15 ENCOUNTER — Encounter (HOSPITAL_COMMUNITY): Payer: Self-pay | Admitting: *Deleted

## 2011-08-15 NOTE — Pre-Procedure Instructions (Signed)
08/15/11 1121 pathway opened by mistake- no note added at this time

## 2011-08-15 NOTE — Progress Notes (Signed)
  Subjective:    Patient ID: [name], male    DOB: 04/30/1941, 70 y.o.   MRN: [id number]  HPI Scheduled to have back surgery in a couple of days, here for clearance. Besides the back pain, he is doing well. Also, would like Ambien to be changed, states it made him do "unusual things"  Past Medical History: HYPERTENSION , Dx aprox 2000 HYPERLIPIDEMIA  DIVERTICULITIS, HX OF 2002,admitted to the hospital one time, status-post two outpatient episodes HYPOGONADISM, MALE  ERECTILE DYSFUNCTION   PARESTHESIAS  LE,  per neurosurgery notes a combination of neuropathy and chronic L5 radiculopathy, on neurontin. used to see  Dr Lewit, now sees Dr Ramos  Prostate cancer, hx of (08/2008)  Past Surgical History: rt shoulder surgery (2008) back surgery (2006) melanoma removed left arm (2005) Prostatectomy (09/13/2008)   Review of Systems Denies chest pain or shortness of breath. Rarely has lower extremity edema. He does not have dyspnea on exertion, the only limitant to his physical activity is back pain. No nausea, vomiting, diarrhea.     Objective:   Physical Exam  Constitutional: He is oriented to person, place, and time. He appears well-developed and well-nourished.  HENT:  Head: Normocephalic and atraumatic.  Cardiovascular: Normal rate, regular rhythm and normal heart sounds.   No murmur heard. Pulmonary/Chest: Effort normal and breath sounds normal. No respiratory distress. He has no wheezes. He has no rales.  Abdominal: Soft. Bowel sounds are normal. He exhibits no distension. There is no tenderness. There is no rebound and no guarding.  Musculoskeletal: He exhibits no edema.  Neurological: He is alert and oriented to person, place, and time.  Skin: Skin is warm and dry.  Psychiatric: He has a normal mood and affect. His behavior is normal. Judgment and thought content normal.      Assessment & Plan:   

## 2011-08-15 NOTE — Pre-Procedure Instructions (Signed)
After speaking with office manager at [place], Foye Clock,   Received call from [place], [place] and procedure added for surgical note to be signed by patient friday

## 2011-08-16 ENCOUNTER — Inpatient Hospital Stay (HOSPITAL_COMMUNITY): Payer: Medicare Other

## 2011-08-16 ENCOUNTER — Inpatient Hospital Stay (HOSPITAL_COMMUNITY)
Admission: RE | Admit: 2011-08-16 | Discharge: 2011-08-18 | DRG: 491 | Disposition: A | Discharge status: Home / Self Care | Payer: Medicare Other | Source: Ambulatory Visit | Attending: Orthopedic Surgery | Admitting: Orthopedic Surgery

## 2011-08-16 ENCOUNTER — Encounter (HOSPITAL_COMMUNITY): Payer: Self-pay | Admitting: Anesthesiology

## 2011-08-16 ENCOUNTER — Inpatient Hospital Stay (HOSPITAL_COMMUNITY): Payer: Medicare Other | Admitting: Anesthesiology

## 2011-08-16 ENCOUNTER — Encounter (HOSPITAL_COMMUNITY): Payer: Self-pay

## 2011-08-16 ENCOUNTER — Encounter (HOSPITAL_COMMUNITY)
Admission: RE | Disposition: A | Discharge status: Home / Self Care | Payer: Self-pay | Source: Ambulatory Visit | Attending: Orthopedic Surgery

## 2011-08-16 DIAGNOSIS — Z85820 Personal history of malignant melanoma of skin: Secondary | ICD-10-CM

## 2011-08-16 DIAGNOSIS — Z9889 Other specified postprocedural states: Secondary | ICD-10-CM

## 2011-08-16 DIAGNOSIS — M48061 Spinal stenosis, lumbar region without neurogenic claudication: Principal | ICD-10-CM | POA: Diagnosis present

## 2011-08-16 DIAGNOSIS — I10 Essential (primary) hypertension: Secondary | ICD-10-CM | POA: Diagnosis present

## 2011-08-16 HISTORY — PX: LUMBAR LAMINECTOMY/DECOMPRESSION MICRODISCECTOMY: SHX5026

## 2011-08-16 SURGERY — LUMBAR LAMINECTOMY/DECOMPRESSION MICRODISCECTOMY
Anesthesia: General | Site: Back | Wound class: Clean

## 2011-08-16 MED ORDER — HYDROMORPHONE HCL PF 1 MG/ML IJ SOLN
INTRAMUSCULAR | Status: AC
  Filled 2011-08-16: qty 1

## 2011-08-16 MED ORDER — FENTANYL CITRATE 0.05 MG/ML IJ SOLN
INTRAMUSCULAR | Status: DC | PRN
  Administered 2011-08-16 (×3): 25 ug via INTRAVENOUS
  Administered 2011-08-16: 50 ug via INTRAVENOUS
  Administered 2011-08-16: 25 ug via INTRAVENOUS
  Administered 2011-08-16: 100 ug via INTRAVENOUS

## 2011-08-16 MED ORDER — ACETAMINOPHEN 10 MG/ML IV SOLN: 1000.0000 mg | Freq: Once | INTRAVENOUS | Status: AC

## 2011-08-16 MED ORDER — ZOLPIDEM TARTRATE 10 MG PO TABS: 10.0000 mg | ORAL_TABLET | Freq: Every evening | ORAL | Status: DC | PRN

## 2011-08-16 MED ORDER — METHOCARBAMOL 500 MG PO TABS: 500.0000 mg | ORAL_TABLET | Freq: Four times a day (QID) | ORAL | Status: DC | PRN

## 2011-08-16 MED ORDER — ZOLPIDEM TARTRATE 5 MG PO TABS: 5.0000 mg | ORAL_TABLET | Freq: Every evening | ORAL | Status: DC | PRN

## 2011-08-16 MED ORDER — EPHEDRINE SULFATE 50 MG/ML IJ SOLN
INTRAMUSCULAR | Status: DC | PRN
  Administered 2011-08-16: 5 mg via INTRAVENOUS

## 2011-08-16 MED ORDER — GABAPENTIN 300 MG PO CAPS
300.0000 mg | ORAL_CAPSULE | Freq: Two times a day (BID) | ORAL | Status: DC
  Administered 2011-08-16 – 2011-08-18 (×4): 300 mg via ORAL
  Filled 2011-08-16 (×5): qty 1

## 2011-08-16 MED ORDER — PHENOL 1.4 % MT LIQD
1.0000 | OROMUCOSAL | Status: DC | PRN
  Filled 2011-08-16: qty 177

## 2011-08-16 MED ORDER — MENTHOL 3 MG MT LOZG
1.0000 | LOZENGE | OROMUCOSAL | Status: DC | PRN
  Filled 2011-08-16: qty 9

## 2011-08-16 MED ORDER — AMLODIPINE BESYLATE 5 MG PO TABS
5.0000 mg | ORAL_TABLET | Freq: Every day | ORAL | Status: DC
  Administered 2011-08-17 – 2011-08-18 (×2): 5 mg via ORAL
  Filled 2011-08-16 (×3): qty 1

## 2011-08-16 MED ORDER — DEXAMETHASONE SODIUM PHOSPHATE 10 MG/ML IJ SOLN
10.0000 mg | Freq: Once | INTRAMUSCULAR | Status: AC
  Administered 2011-08-16: 10 mg via INTRAVENOUS

## 2011-08-16 MED ORDER — BUPIVACAINE-EPINEPHRINE PF 0.25-1:200000 % IJ SOLN
INTRAMUSCULAR | Status: DC | PRN
  Administered 2011-08-16: 10 mL

## 2011-08-16 MED ORDER — METHOCARBAMOL 100 MG/ML IJ SOLN
500.0000 mg | Freq: Four times a day (QID) | INTRAMUSCULAR | Status: DC | PRN
  Filled 2011-08-16: qty 5

## 2011-08-16 MED ORDER — BENAZEPRIL HCL 40 MG PO TABS
40.0000 mg | ORAL_TABLET | Freq: Every evening | ORAL | Status: DC
  Administered 2011-08-16 – 2011-08-17 (×2): 40 mg via ORAL
  Filled 2011-08-16 (×3): qty 1

## 2011-08-16 MED ORDER — ACETAMINOPHEN 10 MG/ML IV SOLN
1000.0000 mg | Freq: Four times a day (QID) | INTRAVENOUS | Status: AC
  Administered 2011-08-16 – 2011-08-17 (×4): 1000 mg via INTRAVENOUS
  Filled 2011-08-16 (×5): qty 100

## 2011-08-16 MED ORDER — HYDROMORPHONE HCL PF 1 MG/ML IJ SOLN
0.2500 mg | INTRAMUSCULAR | Status: DC | PRN
  Administered 2011-08-16: 0.3 mg via INTRAVENOUS
  Administered 2011-08-16: 0.25 mg via INTRAVENOUS

## 2011-08-16 MED ORDER — ACETAMINOPHEN 10 MG/ML IV SOLN
INTRAVENOUS | Status: DC | PRN
  Administered 2011-08-16: 1000 mg via INTRAVENOUS

## 2011-08-16 MED ORDER — GLYCOPYRROLATE 0.2 MG/ML IJ SOLN
INTRAMUSCULAR | Status: DC | PRN
  Administered 2011-08-16: .8 mg via INTRAVENOUS

## 2011-08-16 MED ORDER — ALPRAZOLAM 0.5 MG PO TABS: 0.5000 mg | ORAL_TABLET | Freq: Every evening | ORAL | Status: DC | PRN

## 2011-08-16 MED ORDER — LIDOCAINE HCL (CARDIAC) 20 MG/ML IV SOLN
INTRAVENOUS | Status: DC | PRN
  Administered 2011-08-16: 100 mg via INTRAVENOUS

## 2011-08-16 MED ORDER — SODIUM CHLORIDE 0.9 % IV SOLN: 250.0000 mL | INTRAVENOUS | Status: DC

## 2011-08-16 MED ORDER — LACTATED RINGERS IV SOLN
INTRAVENOUS | Status: DC
  Administered 2011-08-16 – 2011-08-17 (×3): via INTRAVENOUS

## 2011-08-16 MED ORDER — LACTATED RINGERS IV SOLN: INTRAVENOUS | Status: DC

## 2011-08-16 MED ORDER — THROMBIN 5000 UNITS EX SOLR
CUTANEOUS | Status: AC
  Filled 2011-08-16: qty 10000

## 2011-08-16 MED ORDER — ONDANSETRON HCL 4 MG/2ML IJ SOLN: 4.0000 mg | INTRAMUSCULAR | Status: DC | PRN

## 2011-08-16 MED ORDER — LACTATED RINGERS IV SOLN
INTRAVENOUS | Status: DC
  Administered 2011-08-16 (×2): via INTRAVENOUS
  Administered 2011-08-16: 1000 mL via INTRAVENOUS

## 2011-08-16 MED ORDER — OXYCODONE HCL 5 MG PO TABS
10.0000 mg | ORAL_TABLET | ORAL | Status: DC | PRN
  Administered 2011-08-16 – 2011-08-17 (×2): 10 mg via ORAL
  Filled 2011-08-16 (×2): qty 2

## 2011-08-16 MED ORDER — 0.9 % SODIUM CHLORIDE (POUR BTL) OPTIME
TOPICAL | Status: DC | PRN
  Administered 2011-08-16: 1000 mL

## 2011-08-16 MED ORDER — NEOSTIGMINE METHYLSULFATE 1 MG/ML IJ SOLN
INTRAMUSCULAR | Status: DC | PRN
  Administered 2011-08-16: 5 mg via INTRAVENOUS

## 2011-08-16 MED ORDER — THROMBIN 5000 UNITS EX KIT
PACK | CUTANEOUS | Status: DC | PRN
  Administered 2011-08-16: 10000 [IU] via TOPICAL

## 2011-08-16 MED ORDER — DEXAMETHASONE SODIUM PHOSPHATE 4 MG/ML IJ SOLN
4.0000 mg | Freq: Four times a day (QID) | INTRAMUSCULAR | Status: DC
  Filled 2011-08-16 (×11): qty 1

## 2011-08-16 MED ORDER — SODIUM CHLORIDE 0.9 % IJ SOLN: 3.0000 mL | INTRAMUSCULAR | Status: DC | PRN

## 2011-08-16 MED ORDER — SUCCINYLCHOLINE CHLORIDE 20 MG/ML IJ SOLN
INTRAMUSCULAR | Status: DC | PRN
  Administered 2011-08-16: 100 mg via INTRAVENOUS

## 2011-08-16 MED ORDER — ONDANSETRON HCL 4 MG/2ML IJ SOLN
INTRAMUSCULAR | Status: DC | PRN
  Administered 2011-08-16: 4 mg via INTRAVENOUS

## 2011-08-16 MED ORDER — CEFAZOLIN SODIUM 1-5 GM-% IV SOLN
1.0000 g | Freq: Three times a day (TID) | INTRAVENOUS | Status: AC
  Administered 2011-08-16 – 2011-08-17 (×2): 1 g via INTRAVENOUS
  Filled 2011-08-16 (×2): qty 50

## 2011-08-16 MED ORDER — PROPOFOL 10 MG/ML IV BOLUS
INTRAVENOUS | Status: DC | PRN
  Administered 2011-08-16: 180 mg via INTRAVENOUS

## 2011-08-16 MED ORDER — MORPHINE SULFATE 4 MG/ML IJ SOLN: 1.0000 mg | INTRAMUSCULAR | Status: DC | PRN

## 2011-08-16 MED ORDER — ROCURONIUM BROMIDE 100 MG/10ML IV SOLN
INTRAVENOUS | Status: DC | PRN
  Administered 2011-08-16: 25 mg via INTRAVENOUS
  Administered 2011-08-16: 10 mg via INTRAVENOUS
  Administered 2011-08-16: 5 mg via INTRAVENOUS
  Administered 2011-08-16 (×2): 20 mg via INTRAVENOUS

## 2011-08-16 MED ORDER — PROMETHAZINE HCL 25 MG/ML IJ SOLN: 6.2500 mg | INTRAMUSCULAR | Status: DC | PRN

## 2011-08-16 MED ORDER — SODIUM CHLORIDE 0.9 % IJ SOLN
3.0000 mL | Freq: Two times a day (BID) | INTRAMUSCULAR | Status: DC
  Administered 2011-08-16: 3 mL via INTRAVENOUS

## 2011-08-16 MED ORDER — CEFAZOLIN SODIUM-DEXTROSE 2-3 GM-% IV SOLR
2.0000 g | INTRAVENOUS | Status: AC
  Administered 2011-08-16: 2 g via INTRAVENOUS

## 2011-08-16 MED ORDER — DEXAMETHASONE 4 MG PO TABS
4.0000 mg | ORAL_TABLET | Freq: Four times a day (QID) | ORAL | Status: DC
  Administered 2011-08-16 – 2011-08-18 (×8): 4 mg via ORAL
  Filled 2011-08-16 (×11): qty 1

## 2011-08-16 MED ORDER — BUPIVACAINE-EPINEPHRINE PF 0.25-1:200000 % IJ SOLN
INTRAMUSCULAR | Status: AC
  Filled 2011-08-16: qty 60

## 2011-08-16 MED ORDER — HEMOSTATIC AGENTS (NO CHARGE) OPTIME
TOPICAL | Status: DC | PRN
  Administered 2011-08-16: 1

## 2011-08-16 MED ORDER — METOPROLOL TARTRATE 25 MG PO TABS
25.0000 mg | ORAL_TABLET | Freq: Two times a day (BID) | ORAL | Status: DC
  Administered 2011-08-16 – 2011-08-18 (×4): 25 mg via ORAL
  Filled 2011-08-16 (×6): qty 1

## 2011-08-16 SURGICAL SUPPLY — 52 items
BAG ZIPLOCK 12X15 (MISCELLANEOUS) IMPLANT
BANDAGE GAUZE ELAST BULKY 4 IN (GAUZE/BANDAGES/DRESSINGS) IMPLANT
CLEANER TIP ELECTROSURG 2X2 (MISCELLANEOUS) ×2 IMPLANT
CLOSURE STERI STRIP 1/2 X4 (GAUZE/BANDAGES/DRESSINGS) ×2 IMPLANT
CLOTH BEACON ORANGE TIMEOUT ST (SAFETY) ×2 IMPLANT
CONT SPECI 4OZ STER CLIK (MISCELLANEOUS) ×2 IMPLANT
COVER MAYO STAND STRL (DRAPES) ×2 IMPLANT
DRAIN CHANNEL 15F RND FF 3/16 (WOUND CARE) ×2 IMPLANT
DRAIN PENROSE 18X1/4 LTX STRL (WOUND CARE) IMPLANT
DRAPE C-ARM 42X72 X-RAY (DRAPES) IMPLANT
DRAPE LG THREE QUARTER DISP (DRAPES) ×2 IMPLANT
DRAPE ORTHO SPLIT 77X108 STRL (DRAPES) ×2
DRAPE POUCH INSTRU U-SHP 10X18 (DRAPES) ×2 IMPLANT
DRAPE SURG 17X11 SM STRL (DRAPES) ×2 IMPLANT
DRAPE SURG ORHT 6 SPLT 77X108 (DRAPES) ×2 IMPLANT
DRAPE U-SHAPE 47X51 STRL (DRAPES) ×2 IMPLANT
DRESSING ALLEVYN BORDER HEEL (GAUZE/BANDAGES/DRESSINGS) ×2 IMPLANT
DRSG ADAPTIC 3X8 NADH LF (GAUZE/BANDAGES/DRESSINGS) IMPLANT
DRSG EMULSION OIL 3X3 NADH (GAUZE/BANDAGES/DRESSINGS) IMPLANT
DRSG MEPILEX BORDER 4X4 (GAUZE/BANDAGES/DRESSINGS) IMPLANT
DRSG TELFA 4X5 ISLAND ADH (GAUZE/BANDAGES/DRESSINGS) IMPLANT
DURAPREP 26ML APPLICATOR (WOUND CARE) ×2 IMPLANT
ELECT BLADE TIP CTD 4 INCH (ELECTRODE) ×2 IMPLANT
ELECT REM PT RETURN 9FT ADLT (ELECTROSURGICAL) ×2
ELECTRODE REM PT RTRN 9FT ADLT (ELECTROSURGICAL) ×1 IMPLANT
EVACUATOR SILICONE 100CC (DRAIN) ×2 IMPLANT
FLOSEAL (HEMOSTASIS) IMPLANT
GLOVE ECLIPSE 8.5 STRL (GLOVE) ×2 IMPLANT
GOWN STRL REIN XL XLG (GOWN DISPOSABLE) ×2 IMPLANT
KIT BASIN OR (CUSTOM PROCEDURE TRAY) ×2 IMPLANT
MANIFOLD NEPTUNE II (INSTRUMENTS) ×2 IMPLANT
NEEDLE SPNL 18GX3.5 QUINCKE PK (NEEDLE) ×4 IMPLANT
NS IRRIG 1000ML POUR BTL (IV SOLUTION) ×4 IMPLANT
PATTIES SURGICAL .5 X.5 (GAUZE/BANDAGES/DRESSINGS) IMPLANT
POSITIONER SURGICAL ARM (MISCELLANEOUS) ×4 IMPLANT
SPONGE LAP 4X18 X RAY DECT (DISPOSABLE) IMPLANT
SPONGE SURGIFOAM ABS GEL 100 (HEMOSTASIS) ×2 IMPLANT
STAPLER VISISTAT 35W (STAPLE) IMPLANT
STRIP CLOSURE SKIN 1/2X4 (GAUZE/BANDAGES/DRESSINGS) IMPLANT
SUT ETHILON 3 0 PS 1 (SUTURE) ×4 IMPLANT
SUT MNCRL AB 3-0 PS2 18 (SUTURE) ×2 IMPLANT
SUT VIC AB 1 CT1 27 (SUTURE) ×2
SUT VIC AB 1 CT1 27XBRD ANTBC (SUTURE) ×2 IMPLANT
SUT VIC AB 2-0 CT1 18 (SUTURE) ×4 IMPLANT
SUT VIC AB 2-0 CT1 27 (SUTURE) ×2
SUT VIC AB 2-0 CT1 TAPERPNT 27 (SUTURE) ×2 IMPLANT
SUT VICRYL 0 UR6 27IN ABS (SUTURE) IMPLANT
TOWEL OR 17X26 10 PK STRL BLUE (TOWEL DISPOSABLE) ×4 IMPLANT
TRAY FOLEY CATH 14FRSI W/METER (CATHETERS) ×2 IMPLANT
TRAY LAMINECTOMY (CUSTOM PROCEDURE TRAY) ×2 IMPLANT
WATER STERILE IRR 1500ML POUR (IV SOLUTION) ×2 IMPLANT
YANKAUER SUCT BULB TIP NO VENT (SUCTIONS) ×2 IMPLANT

## 2011-08-16 NOTE — Op Note (Signed)
Op report dictated - job No. [id number]

## 2011-08-16 NOTE — Anesthesia Preprocedure Evaluation (Signed)
Anesthesia Evaluation  Patient identified by MRN, date of birth, ID band Patient awake    Reviewed: Allergy & Precautions, H&P , NPO status , Patient's Chart, lab work & pertinent test results, reviewed documented beta blocker date and time   Airway Mallampati: II TM Distance: >3 FB Neck ROM: full    Dental No notable dental hx.    Pulmonary neg pulmonary ROS,  clear to auscultation  Pulmonary exam normal       Cardiovascular Exercise Tolerance: Good hypertension, Pt. on medications and Pt. on home beta blockers neg cardio ROS regular Normal    Neuro/Psych Negative Neurological ROS  Negative Psych ROS   GI/Hepatic negative GI ROS, Neg liver ROS,   Endo/Other  Negative Endocrine ROS  Renal/GU negative Renal ROS  Genitourinary negative   Musculoskeletal   Abdominal Normal abdominal exam  (+)   Peds  Hematology negative hematology ROS (+)   Anesthesia Other Findings   Reproductive/Obstetrics negative OB ROS                           Anesthesia Physical Anesthesia Plan  ASA: II  Anesthesia Plan: General   Post-op Pain Management:    Induction:   Airway Management Planned:   Additional Equipment:   Intra-op Plan:   Post-operative Plan:   Informed Consent: I have reviewed the patients History and Physical, chart, labs and discussed the procedure including the risks, benefits and alternatives for the proposed anesthesia with the patient or authorized representative who has indicated his/her understanding and acceptance.   Dental Advisory Given  Plan Discussed with: CRNA  Anesthesia Plan Comments:         Anesthesia Quick Evaluation

## 2011-08-16 NOTE — Anesthesia Postprocedure Evaluation (Signed)
Anesthesia Post Note  Patient: [name]  Procedure(s) Performed:  LUMBAR LAMINECTOMY/DECOMPRESSION MICRODISCECTOMY - Lumbar Decompression and Foraminotomy L4-S1    Anesthesia type: General  Patient location: PACU  Post pain: Pain level controlled  Post assessment: Post-op Vital signs reviewed  Last Vitals:  Filed Vitals:   08/16/11 1840  BP:   Pulse: 74  Temp:   Resp: 12    Post vital signs: Reviewed  Level of consciousness: sedated  Complications: No apparent anesthesia complications

## 2011-08-16 NOTE — Transfer of Care (Signed)
Immediate Anesthesia Transfer of Care Note  Patient: [name]  Procedure(s) Performed:  LUMBAR LAMINECTOMY/DECOMPRESSION MICRODISCECTOMY - Lumbar Decompression and Foraminotomy L4-S1    Patient Location: PACU  Anesthesia Type: General  Level of Consciousness: awake and alert   Airway & Oxygen Therapy: Patient Spontanous Breathing and Patient connected to face mask oxygen  Post-op Assessment: Report given to PACU RN and Post -op Vital signs reviewed and stable  Post vital signs: Reviewed and stable  Complications: No apparent anesthesia complications

## 2011-08-17 MED ORDER — OXYCODONE-ACETAMINOPHEN 10-325 MG PO TABS: 1.0000 | ORAL_TABLET | Freq: Four times a day (QID) | ORAL | Status: AC | PRN

## 2011-08-17 MED ORDER — CEFAZOLIN SODIUM 1-5 GM-% IV SOLN
1.0000 g | Freq: Three times a day (TID) | INTRAVENOUS | Status: DC
  Administered 2011-08-17 – 2011-08-18 (×4): 1 g via INTRAVENOUS
  Filled 2011-08-17 (×7): qty 50

## 2011-08-17 MED ORDER — METHOCARBAMOL 500 MG PO TABS: 500.0000 mg | ORAL_TABLET | Freq: Three times a day (TID) | ORAL | Status: AC

## 2011-08-17 MED ORDER — SIMETHICONE 80 MG PO CHEW
80.0000 mg | CHEWABLE_TABLET | Freq: Four times a day (QID) | ORAL | Status: DC | PRN
  Administered 2011-08-17: 80 mg via ORAL
  Filled 2011-08-17: qty 1

## 2011-08-17 MED ORDER — POLYETHYLENE GLYCOL 3350 17 G PO PACK: 17.0000 g | PACK | Freq: Every day | ORAL | Status: AC

## 2011-08-17 MED ORDER — ONDANSETRON HCL 4 MG PO TABS: 4.0000 mg | ORAL_TABLET | Freq: Three times a day (TID) | ORAL | Status: AC | PRN

## 2011-08-17 NOTE — Progress Notes (Signed)
Leory Plowman 70 y.o. 08/16/2011   Lab. Results: No results found for this basename: WBC:2,HGB:2,HCT:2,PLT:2 in the last 72 hours BMET No results found for this basename: NA:2,K:2,CL:2,CO2:2,GLUCOSE:2,BUN:2,CREATININE:2,CALCIUM:2,CBG:2 in the last 72 hours  INR  Date Value Range Status  08/09/2011 0.92  0.00-1.49 (no units) Final   VITALS Filed Vitals:   08/17/11 0610  BP: 114/56  Pulse: 68  Temp: 98.6 F (37 C)  Resp: 12     Subjective Patient comfortable.  No significant back or leg pain Objective NVI Compartments soft/NT No sob/cp abd soft/nt Drain - 200cc overnight Voiding spontaneously  Assessment/ Plan Patient stable.  Mobilization with PT today If drain output <75cc /shift than can d/c in AM Possible d/c Sunday Continue care    Hildred Pharo D 12/29/20127:18 AM

## 2011-08-17 NOTE — Discharge Summary (Signed)
Patient ID: [name] MRN: [id number] DOB/AGE: Mar 01, 1941 70 y.o.  Admit date: 08/16/2011 Discharge date: 08/17/2011  Admission Diagnoses:  Lumbar spinal stenosis, L4-5 and L5-S1. Post  laminectomy syndrome with previous lumbar diskectomy, L4-5, left side.  Discharge Diagnoses:  Lumbar spinal stenosis, L4-5 and L5-S1. Post  laminectomy syndrome with previous lumbar diskectomy, L4-5, left side.   Past Medical History  Diagnosis Date  . Headache     migraines x 3 in life  . Cancer     melanoma left forearm 2005- no chemo, prostate  . Hypertension     Surgeries: Procedure(s): LUMBAR LAMINECTOMY/DECOMPRESSION MICRODISCECTOMY on 08/16/2011   Consultants:    Discharged Condition: Improved  Hospital Course: [name] is an 70 y.o. male who was admitted 08/16/2011 for operative treatment of lumbar spinal stenosis, L4-5 and L5-S1. Post laminectomy syndrome with previous lumbar diskectomy, L4-5, left side.  Patient has severe unremitting pain that affects sleep, daily activities, and work/hobbies. After pre-op clearance the patient was taken to the operating room on 08/16/2011 and underwent  Procedure(s): LUMBAR LAMINECTOMY/DECOMPRESSION MICRODISCECTOMY.    Patient was given perioperative antibiotics: Anti-infectives     Start     Dose/Rate Route Frequency Ordered Stop   08/17/11 0800   ceFAZolin (ANCEF) IVPB 1 g/50 mL premix        1 g 100 mL/hr over 30 Minutes Intravenous 3 times per day 08/17/11 0717     08/16/11 2200   ceFAZolin (ANCEF) IVPB 1 g/50 mL premix        1 g 100 mL/hr over 30 Minutes Intravenous 3 times per day 08/16/11 1803 08/17/11 0712   08/16/11 1030   ceFAZolin (ANCEF) IVPB 2 g/50 mL premix        2 g 100 mL/hr over 30 Minutes Intravenous 60 min pre-op 08/16/11 1029 08/16/11 1412           Patient was given sequential compression devices and early ambulation to prevent DVT.  Patient benefited maximally from hospital stay and there were no  complications.    Recent vital signs: Patient Vitals for the past 24 hrs:  BP Temp Temp src Pulse Resp SpO2 Height Weight  08/17/11 0642 - - - - - - 6' (1.829 m) 95.255 kg (210 lb)  08/17/11 0610 114/56 mmHg 98.6 F (37 C) Oral 68  12  96 % - -  08/17/11 0240 118/65 mmHg 97.7 F (36.5 C) Oral 67  14  95 % - -  08/16/11 2150 135/69 mmHg 97.5 F (36.4 C) Oral 96  14  96 % - -  08/16/11 2050 150/77 mmHg 97.5 F (36.4 C) Oral 98  14  96 % - -  08/16/11 1950 123/70 mmHg 98 F (36.7 C) Oral 86  14  94 % - -  08/16/11 1851 132/62 mmHg 98 F (36.7 C) - 71  14  98 % - -  08/16/11 1840 - - - 74  12  99 % - -  08/16/11 1830 148/65 mmHg 99 F (37.2 C) - 77  15  99 % - -  08/16/11 1815 139/61 mmHg - - 65  18  100 % - -  08/16/11 1800 133/57 mmHg 98 F (36.7 C) - 60  14  100 % - -  08/16/11 1024 161/78 mmHg 98.2 F (36.8 C) - 61  20  95 % - -     Recent laboratory studies: No results found for this basename: WBC:2,HGB:2,HCT:2,PLT:2,NA:2,K:2,CL:2,CO2:2,BUN:2,CREATININE:2,GLUCOSE:2,PT:2,INR:2,CALCIUM,2: in the last 72 hours  Discharge Medications:  Current Discharge Medication List    START taking these medications   Details  methocarbamol (ROBAXIN) 500 MG tablet Take 1 tablet (500 mg total) by mouth 3 (three) times daily. Qty: 45 tablet, Refills: 0    ondansetron (ZOFRAN) 4 MG tablet Take 1 tablet (4 mg total) by mouth every 8 (eight) hours as needed for nausea. Qty: 20 tablet, Refills: 0    oxyCODONE-acetaminophen (PERCOCET) 10-325 MG per tablet Take 1 tablet by mouth every 6 (six) hours as needed for pain. Qty: 60 tablet, Refills: 0    polyethylene glycol (MIRALAX) packet Take 17 g by mouth daily. Qty: 14 each, Refills: 0      CONTINUE these medications which have NOT CHANGED   Details  amLODipine (NORVASC) 5 MG tablet Take 5 mg by mouth daily.     benazepril (LOTENSIN) 40 MG tablet Take 40 mg by mouth every evening.     Cyanocobalamin (VITAMIN B-12 PO) Take 1 tablet by mouth  daily.     fenofibrate 160 MG tablet      gabapentin (NEURONTIN) 300 MG capsule Take 300 mg by mouth 2 (two) times daily.     metoprolol tartrate (LOPRESSOR) 25 MG tablet Take 1 tablet (25 mg total) by mouth 2 (two) times daily. Qty: 6 tablet, Refills: 0    !! OVER THE COUNTER MEDICATION Place 1 spray into the nose 2 (two) times daily as needed. ALLERGIES.  NATURAL INHALER MADE BY NEW CHOICE    ALPRAZolam (XANAX) 0.5 MG tablet Take 1 tablet (0.5 mg total) by mouth at bedtime as needed for sleep. Qty: 30 tablet, Refills: 1    aspirin 81 MG tablet Take 81 mg by mouth at bedtime.     Multiple Vitamins-Minerals (MULTIVITAMIN,TX-MINERALS) tablet Take 1 tablet by mouth daily.     !! OVER THE COUNTER MEDICATION Take 1 tablet by mouth at bedtime as needed. NATURES MEASURE RELAX AND SLEEP.     !! - Potential duplicate medications found. Please discuss with provider.    STOP taking these medications     HYDROcodone-acetaminophen (VICODIN) 5-500 MG per tablet         Diagnostic Studies:  Chest 2 View  08/09/2011  *RADIOLOGY REPORT*  Clinical Data: Preoperative examination  CHEST - 2 VIEW  Comparison: 07/22/2008; 07/26/2005  Findings: Unchanged cardiac silhouette and mediastinal contours. No focal parenchymal opacity.  No pleural effusion or pneumothorax. Mild S-shaped scoliotic curvature of the thoracolumbar spine.  IMPRESSION: No acute cardiopulmonary disease.  Original Report Authenticated By: Waynard Reeds, M.D.   Dg Lumbar Spine 2-3 Views  08/09/2011  *RADIOLOGY REPORT*  Clinical Data:  Low back pain  LUMBAR SPINE - 2-3 VIEW  Comparison: 11/25/2007 MRI.  Findings: .  Five lumbar type vertebral bodies are present.  Films numbered accordingly. There is mild disc space narrowing at L3-4, L4-5, and L5-S1.  Anatomic alignment is present.  Lower lumbar facet arthropathy is present.  IMPRESSION: Lumbar spondylosis.  Original Report Authenticated By: Elsie Stain, M.D.   Dg Lumbar Spine 1  View  08/16/2011  *RADIOLOGY REPORT*  Clinical Data: Needle localization  LUMBAR SPINE - 1 VIEW  Comparison: Lumbar spine radiographs - 08/09/2011  Findings:  Lumbar spine labeling is based on the preoperative lumbar spine radiographs.  A single lateral radiograph of the lower lumbar spine is provided for review.  Spinal marking needle tips are seen posterior to the L5 and S1 vertebral bodies.  IMPRESSION: Intraoperative spinal localization as above.  Original Report Authenticated By: Waynard Reeds, M.D.   Dg Spine Portable 1 View  08/16/2011  *RADIOLOGY REPORT*  Clinical Data: Needle localization  DG SPINE PORTABLE - 1 VIEW  Comparison: Lumbar spine radiographs - 08/09/2011  Findings:  Lumbar spine labeling is based on the preoperative lumbar spine radiographs.  A surgical marking instrument tip projects posterior to the L4 - L5 intervertebral disc space.  A surgical sponge is seen overlying the soft tissues posterior to the marking instrument.  IMPRESSION: Intraoperative spinal localization as above.  Original Report Authenticated By: Waynard Reeds, M.D.    Disposition: STABLE at discharge Discharge Plan:  Discharge to home with home health services  Discharge Orders    Future Appointments: Provider: Department: Dept Phone: Center:   10/03/2011 8:30 AM Wanda Plump, MD [facility] [phone] [facility]     Future Orders Please Complete By Expires   Diet - low sodium heart healthy      Face-to-face encounter (required for Medicare/Medicaid patients)      Comments:   I Gwinda Maine certify that this patient is under my care and that I, or a nurse practitioner or physician's assistant working with me, had a face-to-face encounter that meets the physician face-to-face encounter requirements with this patient on 08/17/2011.       Questions: Responses:   The encounter with the patient was in whole, or in part, for the following medical condition, which is the primary reason for home  health care lumbar stenosis s/p decompression foraminotomy   I certify that, based on my findings, the following services are medically necessary home health services Physical therapy   My clinical findings support the need for the above services OTHER SEE COMMENTS   Further, I certify that my clinical findings support that this patient is homebound (i.e. absences from home require considerable and taxing effort and are for medical reasons or religious services or infrequently or of short duration when for other reasons) Unable to leave home safely without assistance   To provide the following care/treatments PT   Call MD / Call 911      Comments:   If you experience chest pain or shortness of breath, CALL 911 and be transported to the hospital emergency room.  If you develope a fever above 101 F, pus (white drainage) or increased drainage or redness at the wound, or calf pain, call your surgeon's office.   Constipation Prevention      Comments:   Drink plenty of fluids.  Prune juice may be helpful.  You may use a stool softener, such as Colace (over the counter) 100 mg twice a day.  Use MiraLax (over the counter) for constipation as needed.   Increase activity slowly as tolerated      Weight Bearing as taught in Physical Therapy      Comments:   Use a walker or crutches as instructed.   Discharge wound care:      Comments:   Keep incision clean and dry.  Change your bandage as instructed by your health care providers.  May shower beginning Wednesday; pat to dry.  DO NOT put lotion or powder on your incision.      Follow-up Information    Follow up with BROOKS,DAHARI D in 2 weeks.   Contact information:   [facility] [address], [address] [place] [address] [phone]           Signed: Gwinda Maine 08/17/2011, 7:27  AM     

## 2011-08-17 NOTE — Op Note (Signed)
[name], [name] NO.:  [id number]  MEDICAL RECORD NO.:  [id number]  LOCATION:  1620                         FACILITY:  [facility]  PHYSICIAN:  Alvy Beal, MD    DATE OF BIRTH:  March 15, 1941  DATE OF PROCEDURE:  08/16/2011 DATE OF DISCHARGE:                              OPERATIVE REPORT   PRIMARY CARE PHYSICIAN:  Willow Ora, MD  PREOPERATIVE DIAGNOSIS:  Lumbar spinal stenosis, L4-5 and L5-S1.  Post laminectomy syndrome with previous lumbar diskectomy, L4-5, left side.  POSTOPERATIVE DIAGNOSIS:  Lumbar spinal stenosis, L4-5 and L5-S1.  Post laminectomy syndrome with previous lumbar diskectomy, L4-5, left side.  PROCEDURE:  Revision L4-5 lumbar decompression, and L5-S1 lumbar decompression.  SURGEON:  Odeth Bry D. Shon Baton, MD  ASSISTANT:  Norval Gable, PA  ESTIMATED BLOOD LOSS:  200 mL.  COMPLICATIONS:  None.  CONDITION:  Stable.  DRAINS:  Drain placed intraoperatively.  HISTORY:  This is a very pleasant 70 year old gentleman who has been complaining of significant back, buttock, and bilateral leg pain.  The patient has had a previous lumbar diskectomy in the past for back and radicular leg pain, and was feeling as though the leg was getting worse. However, at this time he is feeling bilateral symptoms.  His overall quality of life was deteriorating and as a result, he presented to me. His MRI confirmed foraminal spinal stenosis at L4-5 and L5-S1 with recurrent compression at L4-5 due to scar tissue, and new degenerative changes, specifically facet arthrosis causing foraminal and lateral recess stenosis at L5-S1.  I discussed all appropriate treatment options including injection therapy physiotherapy, activity modification, observation, and surgery.  The patient elected to proceed with surgery. All appropriate risks, benefits, and alternatives to surgery were discussed and consent was obtained.  OPERATIVE NOTE:  The patient was brought to the operating  room and placed supine on the operating table.  After successful induction of general anesthesia and endotracheal intubation, TEDs, SCDs, and Foley were placed.  The patient was turned prone onto the Wilson frame, all bony prominences were well padded, and the back was prepped and draped in usual sterile fashion.  Appropriate time-out was done confirming patient, procedure, and all other pertinent important data.  Once this was completed, 2 needles were placed in the back.  Intraoperative x-ray was taken to confirm incision location.  Once this was confirmed, the incision site was infiltrated with 0.25% Marcaine and a midline incision was made.  Sharp dissection was carried out down to the deep fascia. The deep fascia was sharply incised and then I used a Cobb elevator and Bovie to mobilize the paraspinal muscles on the right side to expose the L4, L5, and S1 spinous processes.  I then carried it out over the lamina and exposed the facet complexes.  At this point, I placed a Penfield 4 underneath the L4 lamina, took an x-ray and confirmed the L4 level. Once I confirmed the appropriate levels, I then proceeded with decompression.  I removed the spinous process of L5 in its entirety and the majority of the L4.  I then used fine nerve hook to develop a plane underneath the L5 lamina and  then used a 2 and 3-mm Kerrison punch to perform a generous laminectomy of L5.  Once I had the central decompression completed, I then went into the lateral recess resecting the ligamentum flavum, and the significant overgrown bone spurs from the L5-S1 facet complex.  I identified the traversing S1 nerve root, and then continued my dissection over this until I exposed the S1 pedicle.  I then put my curette into the S1 neural foramen and decompressed this area.  I did this bilaterally.  I then could take a hockey-stick dural elevator and freely passed it out into the lateral recess into the S1  foramen bilaterally.  I then continued my dissection superiorly, identifying the L5 nerve root and the L5 pedicle.  I did this first on the right-hand side as this was the native area.  Once I had the right posterolateral area decompressed, I could visualize the right L5 pedicle.  I used a combination of pituitary rongeurs, curettes, and Kerrison rongeurs to effectively mobilize the bone and ligamentum flavum to complete my decompression.  I then was able take a Penfield 4, and ultimately a dural elevator and pass it out at the L5 neural foramen, indicating I had an adequate decompression.  I then continued my dissection up until I could palpate the L4 pedicle and out the L4 neural foramen.  Again, I had adequate decompression at this level as well.  I then started my dissection across to the left side.  There was significant scar tissue from his previous decompression/diskectomy.  I gently mobilized the scar tissue and developed the plane between the scar tissue and the thecal sac.  Great care was taken during this dissection using the [facility] 4 and nerve hook and curette to prevent injury to the thecal sac and CSF leak.  Once I had mobilized the scar tissue, I used 2 and 3-mm Kerrison punch to remove it.  I then went into the lateral gutter, and using the same technique I used on the right-hand side, I made sure I had an adequate foraminotomy of L4, L5, and S1 and adequate decompression to the L4, L5, and S1 nerve roots, both in the lateral recess and in the foramen.  I then irrigated the wound copiously with normal saline, used my bipolar electrocautery to obtain hemostasis from the epidural veins, and maintained it using the FloSeal.  I then placed a deep drain and then placed a thrombin-soaked Gelfoam patty over the exposed dura.  At this point, I had an adequate decompression.  There was no residual nerve compression.  I was able to circumferentially sweep underneath the thecal sac  at both the L4 and L5 levels.  I was also able to freely go onto the foramen bilaterally.  I then closed the deep fascia with interrupted #1 Vicryl suture, superficial with 2-0 Vicryl sutures, and a 3-0 Monocryl for the skin.  Steri-Strips and dry dressing were applied. The patient was extubated and transferred to the PACU without incident. At the end of the case, all needle and sponge counts were correct. There were no adverse intraoperative events.  My assistant was Norval Gable.     Alvy Beal, MD     DDB/MEDQ  D:  08/16/2011  T:  08/17/2011  Job:  [id number]  cc:   Willow Ora, MD

## 2011-08-17 NOTE — Plan of Care (Signed)
Problem: Phase II Progression Outcomes Goal: Verbalizes of donning/doffing brace Outcome: Not Progressing Per e-chart/orders: Brace ordered by MD. Brace not in room. Orders state okay to proceed with PT evaluation.

## 2011-08-17 NOTE — Progress Notes (Signed)
Occupational Therapy Evaluation Patient Details Name: [name] MRN: [id number] DOB: 1940/10/25 Today's Date: 08/17/2011 1012 1032 ev1  Problem List:  Patient Active Problem List  Diagnoses  . HYPOGONADISM, MALE  . HYPERLIPIDEMIA  . ERECTILE DYSFUNCTION  . HYPERTENSION  . DEGENERATIVE JOINT DISEASE  . PARESTHESIA  . PROSTATE CANCER, HX OF  . DIVERTICULITIS, HX OF  . Insomnia    Past Medical History:  Past Medical History  Diagnosis Date  . Headache     migraines x 3 in life  . Cancer     melanoma left forearm 2005- no chemo, prostate  . Hypertension    Past Surgical History:  Past Surgical History  Procedure Date  . Prostatectomy   . Melanoma excision   . Cardiac catheterization     15 years ago  . Colonoscopy w/ polypectomy   . Tonsillectomy   . Rotator cuff repair     right  . Arhtroscopy  right knee   . Back surgery     lumbar discectomy    OT Assessment/Plan/Recommendation OT Assessment Clinical Impression Statement: This 70 yo man had decompression and laminectomy for L4-5; L5-S1.  He verbalizes all education for ADLs following back precautions and will not need any further OT nor bathroom DME. OT Recommendation/Assessment: Patient does not need any further OT services OT Recommendation Equipment Recommended: None recommended by OT OT Goals    OT Evaluation Precautions/Restrictions  Precautions Precautions: Back Precaution Booklet Issued: No Precaution Comments: Reviewed back precautions and logroll technique Required Braces or Orthoses:  (ordered; may be oob without per chart) Restrictions Weight Bearing Restrictions: No Prior Functioning Home Living Lives With: Spouse Type of Home: House Home Layout: One level Home Access: Stairs to enter Entrance Stairs-Rails: Right Entrance Stairs-Number of Steps: 3 Bathroom Shower/Tub: Psychologist, counselling;Other (comment) (has grab bar) Bathroom Toilet: Handicapped height Home Adaptive Equipment:  Straight cane Prior Function Level of Independence: Independent with basic ADLs;Independent with gait Driving: Yes ADL ADL Grooming: Performed;Wash/dry hands;Supervision/safety Where Assessed - Grooming: Standing at sink Upper Body Bathing: Simulated;Set up Where Assessed - Upper Body Bathing: Sitting, chair;Supported Lower Body Bathing: Simulated;Minimal assistance Where Assessed - Lower Body Bathing: Sit to stand from chair Upper Body Dressing: Simulated;Set up Where Assessed - Upper Body Dressing: Sitting, chair;Supported Lower Body Dressing: Simulated;Moderate assistance Where Assessed - Lower Body Dressing: Sit to stand from chair Toilet Transfer: Performed;Supervision/safety Toilet Transfer Method: Proofreader: Comfort height toilet Toileting - Clothing Manipulation: Simulated;Supervision/safety Where Assessed - Glass blower/designer Manipulation: Standing Toileting - Hygiene: Simulated;Supervision/safety Where Assessed - Toileting Hygiene: Standing Tub/Shower Transfer: Other (comment) (pt verbalizes technique; doesn't feel he needs to try) Equipment Used: Rolling walker ADL Comments: wife came at end of session.  verbalizes all education with back precautions Vision/Perception  Vision - History Patient Visual Report: No change from baseline Cognition Cognition Arousal/Alertness: Awake/alert Overall Cognitive Status: Appears within functional limits for tasks assessed Orientation Level: Oriented X4 Sensation/Coordination Sensation Light Touch: Appears Intact Coordination Gross Motor Movements are Fluid and Coordinated: Yes Extremity Assessment RUE Assessment RUE Assessment: Within Functional Limits LUE Assessment LUE Assessment: Within Functional Limits Mobility  Bed Mobility Bed Mobility: Yes Rolling Right: 5: Supervision Right Sidelying to Sit: 5: Supervision Transfers Sit to Stand: 5: Supervision Stand to Sit: 5:  Supervision Exercises   End of Session OT - End of Session Activity Tolerance: Patient tolerated treatment well Patient left: in chair;with call bell in reach General Behavior During Session: [facility] for tasks performed Cognition: [facility] for tasks  performed  Gave pt precaution handout   Jearldine Cassady  [phone]  08/17/2011, 11:14 AM

## 2011-08-17 NOTE — Progress Notes (Signed)
Physical Therapy Evaluation Patient Details Name: RUSELL MENEELY MRN: [id number] DOB: [birth year] Today's Date: 08/17/2011 Time: 7829-5621  Eval II Problem List:  Patient Active Problem List  Diagnoses  . HYPOGONADISM, MALE  . HYPERLIPIDEMIA  . ERECTILE DYSFUNCTION  . HYPERTENSION  . DEGENERATIVE JOINT DISEASE  . PARESTHESIA  . PROSTATE CANCER, HX OF  . DIVERTICULITIS, HX OF  . Insomnia    Past Medical History:  Past Medical History  Diagnosis Date  . Headache     migraines x 3 in life  . Cancer     melanoma left forearm 2005- no chemo, prostate  . Hypertension    Past Surgical History:  Past Surgical History  Procedure Date  . Prostatectomy   . Melanoma excision   . Cardiac catheterization     15 years ago  . Colonoscopy w/ polypectomy   . Tonsillectomy   . Rotator cuff repair     right  . Arhtroscopy  right knee   . Back surgery     lumbar discectomy    PT Assessment/Plan/Recommendation PT Assessment Clinical Impression Statement: Pt is s/p L4-S1 lumbar decompression and foraminotomy. Pt will benefit from skilled PT in the acute care setting to maximize independence with basic functional mobility in preperation for D/C home with wife. PT Recommendation/Assessment: Patient will need skilled PT in the acute care venue PT Problem List: Decreased mobility;Pain;Decreased knowledge of precautions PT Therapy Diagnosis : Acute pain;Difficulty walking PT Plan PT Frequency: Min 5X/week PT Treatment/Interventions: Gait training;Stair training;Functional mobility training;Therapeutic activities;Patient/family education PT Recommendation Recommendations for Other Services: OT consult Follow Up Recommendations: None Equipment Recommended: None recommended by PT PT Goals  Acute Rehab PT Goals PT Goal Formulation: With patient Pt will go Supine/Side to Sit: with modified independence PT Goal: Supine/Side to Sit - Progress: Not met Pt will go Sit to Supine/Side: with  modified independence PT Goal: Sit to Supine/Side - Progress: Not met Pt will go Sit to Stand: with modified independence PT Goal: Sit to Stand - Progress: Not met Pt will Ambulate: >150 feet;with modified independence PT Goal: Ambulate - Progress: Not met Pt will Go Up / Down Stairs: 3-5 stairs;with rail(s) PT Goal: Up/Down Stairs - Progress: Not met Additional Goals Additional Goal #1: Pt will recall 3/3 back precautions and demonstrate adherence during functional mobility PT Goal: Additional Goal #1 - Progress: Not met  PT Evaluation Precautions/Restrictions  Precautions Precautions: Back Precaution Booklet Issued: No Precaution Comments: Reviewed back precautions and logroll technique Required Braces or Orthoses:  (MD ordered brace-not in room. Okay to proceed with PT per orders) Restrictions Weight Bearing Restrictions: No Prior Functioning  Home Living Lives With: Spouse Type of Home: House Home Layout: One level Home Access: Stairs to enter Entrance Stairs-Rails: Right Entrance Stairs-Number of Steps: 3 Home Adaptive Equipment: Straight cane Prior Function Level of Independence: Independent with basic ADLs;Independent with gait Driving: Yes Cognition Cognition Arousal/Alertness: Awake/alert Overall Cognitive Status: Appears within functional limits for tasks assessed Orientation Level: Oriented X4 Sensation/Coordination Sensation Light Touch: Appears Intact Coordination Gross Motor Movements are Fluid and Coordinated: Yes Extremity Assessment RLE Assessment RLE Assessment: Within Functional Limits LLE Assessment LLE Assessment: Within Functional Limits Mobility (including Balance) Bed Mobility Bed Mobility: Yes Rolling Right: 5: Supervision Right Sidelying to Sit: 5: Supervision Transfers Transfers: Yes Sit to Stand: 5: Supervision Stand to Sit: 5: Supervision Ambulation/Gait Ambulation/Gait: Yes Ambulation/Gait Assistance: 5: Supervision Ambulation  Distance (Feet): 300 Feet Assistive device: None (pushing IV pole) Gait Pattern: Within Functional Limits  Posture/Postural Control Posture/Postural Control: No significant limitations Exercise    End of Session PT - End of Session Equipment Utilized During Treatment: Gait belt Activity Tolerance: Patient tolerated treatment well Patient left: in chair;with call bell in reach General Behavior During Session: [facility] for tasks performed Cognition: [facility] for tasks performed  Rebeca Alert [facility] 08/17/2011, 10:44 AM [phone]

## 2011-08-18 NOTE — Progress Notes (Signed)
Pt stable; d/c instructions & scripts given.  No questions/concerned voiced.  Pt transported out via wheelchair to private vehicle with NT and wife.

## 2011-08-18 NOTE — Progress Notes (Addendum)
Physical Therapy Treatment Patient Details Name: JARIAN LONGORIA MRN: [id number] DOB: [birth year] Today's Date: 08/18/2011 9:30-9:49, 10:15-10:24, gt, orthotic   PT Assessment/Plan  PT - Assessment/Plan Comments on Treatment Session: Pt seen in 2 sessions.  First for gait and stairs.  Later the lumbar corset arrived.  2nd session was application of corset and education on its use as well as issueing of illustrated handout with back precautions. Follow Up Recommendations: None Equipment Recommended:  (Pt states he has cane and thinks he has a RW. Declined ordering a RW). PT Goals  Acute Rehab PT Goals PT Goal: Supine/Side to Sit - Progress: Progressing toward goal PT Goal: Sit to Supine/Side - Progress: Progressing toward goal PT Goal: Sit to Stand - Progress: Met PT Goal: Ambulate - Progress: Progressing toward goal PT Goal: Up/Down Stairs - Progress: Met Additional Goals PT Goal: Additional Goal #1 - Progress: Progressing toward goal  PT Treatment Precautions/Restrictions  Precautions Precautions: Back Precaution Booklet Issued: Yes (comment) Precaution Comments: Reviewed back precautions and logroll technique Required Braces or Orthoses: Yes Spinal Brace: Lumbar corset;Applied in standing position Restrictions Weight Bearing Restrictions: No Mobility (including Balance) Bed Mobility Rolling Right: 6: Modified independent (Device/Increase time) Right Sidelying to Sit: 6: Modified independent (Device/Increase time);HOB flat Transfers Sit to Stand: 6: Modified independent (Device/Increase time) Stand to Sit: 6: Modified independent (Device/Increase time) Ambulation/Gait Ambulation/Gait Assistance: 6: Modified independent (Device/Increase time) Ambulation Distance (Feet): 300 Feet Assistive device: Rolling walker (ambulates short distances in room without RW) Gait Pattern: Within Functional Limits Stairs: Yes Stairs Assistance: 5: Supervision Stair Management Technique: Two  rails;Step to pattern;Forwards Number of Stairs: 2     Exercise    End of Session PT - End of Session Equipment Utilized During Treatment: Gait belt Activity Tolerance: Patient tolerated treatment well Patient left: in chair;with call bell in reach Nurse Communication: Mobility status for transfers;Other (comment) (brace) General Behavior During Session: [facility] for tasks performed Cognition: [facility] for tasks performed  [facility] LUBECK 08/18/2011, 10:58 AM

## 2011-08-18 NOTE — Progress Notes (Signed)
Subjective: 2 Days Post-Op Procedure(s) (LRB): LUMBAR LAMINECTOMY/DECOMPRESSION MICRODISCECTOMY (N/A) Patient reports pain as mild.    Patient has complaints of soreness. No bowel movement yet.  Walked 300 feet yesterday.  Objective: Vital signs in last 24 hours: Temp:  [97.7 F (36.5 C)-98.3 F (36.8 C)] 97.8 F (36.6 C) (12/30 0445) Pulse Rate:  [58-64] 58  (12/30 0445) Resp:  [14] 14  (12/30 0445) BP: (138-154)/(68-72) 143/71 mmHg (12/30 0445) SpO2:  [92 %-93 %] 93 % (12/30 0445)  Intake/Output from previous day:  Intake/Output Summary (Last 24 hours) at 08/18/11 0719 Last data filed at 08/18/11 0445  Gross per 24 hour  Intake   1875 ml  Output   2945 ml  Net  -1070 ml    Intake/Output this shift:    Labs: Results for orders placed during the hospital encounter of 08/09/11  CBC      Component Value Range   WBC 6.7  4.0 - 10.5 (K/uL)   RBC 5.29  4.22 - 5.81 (MIL/uL)   Hemoglobin 16.2  13.0 - 17.0 (g/dL)   HCT 40.9  81.1 - 91.4 (%)   MCV 86.4  78.0 - 100.0 (fL)   MCH 30.6  26.0 - 34.0 (pg)   MCHC 35.4  30.0 - 36.0 (g/dL)   RDW 78.2  95.6 - 21.3 (%)   Platelets 179  150 - 400 (K/uL)  COMPREHENSIVE METABOLIC PANEL      Component Value Range   Sodium 140  135 - 145 (mEq/L)   Potassium 4.2  3.5 - 5.1 (mEq/L)   Chloride 105  96 - 112 (mEq/L)   CO2 28  19 - 32 (mEq/L)   Glucose, Bld 96  70 - 99 (mg/dL)   BUN 16  6 - 23 (mg/dL)   Creatinine, Ser 0.86 (*) 0.50 - 1.35 (mg/dL)   Calcium 9.5  8.4 - 57.8 (mg/dL)   Total Protein 7.0  6.0 - 8.3 (g/dL)   Albumin 3.9  3.5 - 5.2 (g/dL)   AST 23  0 - 37 (U/L)   ALT 18  0 - 53 (U/L)   Alkaline Phosphatase 55  39 - 117 (U/L)   Total Bilirubin 0.4  0.3 - 1.2 (mg/dL)   GFR calc non Af Amer 49 (*) >90 (mL/min)   GFR calc Af Amer 57 (*) >90 (mL/min)  DIFFERENTIAL      Component Value Range   Neutrophils Relative 59  43 - 77 (%)   Neutro Abs 3.9  1.7 - 7.7 (K/uL)   Lymphocytes Relative 30  12 - 46 (%)   Lymphs Abs 2.0  0.7 -  4.0 (K/uL)   Monocytes Relative 8  3 - 12 (%)   Monocytes Absolute 0.5  0.1 - 1.0 (K/uL)   Eosinophils Relative 3  0 - 5 (%)   Eosinophils Absolute 0.2  0.0 - 0.7 (K/uL)   Basophils Relative 0  0 - 1 (%)   Basophils Absolute 0.0  0.0 - 0.1 (K/uL)  PROTIME-INR      Component Value Range   Prothrombin Time 12.6  11.6 - 15.2 (seconds)   INR 0.92  0.00 - 1.49   SURGICAL PCR SCREEN      Component Value Range   MRSA, PCR NEGATIVE  NEGATIVE    Staphylococcus aureus POSITIVE (*) NEGATIVE     Exam: Neurovascular intact Sensation intact distally Incision - clean Motor function intact - moving foot and toes well on exam.   Assessment/Plan: 2 Days Post-Op Procedure(s) (  LRB): LUMBAR LAMINECTOMY/DECOMPRESSION MICRODISCECTOMY (N/A) Up with therapy Procedure(s) (LRB): LUMBAR LAMINECTOMY/DECOMPRESSION MICRODISCECTOMY (N/A) Past Medical History  Diagnosis Date  . Headache     migraines x 3 in life  . Cancer     melanoma left forearm 2005- no chemo, prostate  . Hypertension     Diet - heart healthy Follow up - in 10-14 days Activity - WBAT Condition Upon Discharge - Stable D/C Meds - Oxy IR and Robaxin    [name] 08/18/2011, 7:19 AM

## 2011-08-19 ENCOUNTER — Encounter (HOSPITAL_COMMUNITY): Payer: Self-pay | Admitting: Orthopedic Surgery

## 2011-09-21 ENCOUNTER — Other Ambulatory Visit: Payer: Self-pay | Admitting: Internal Medicine

## 2011-09-23 NOTE — Telephone Encounter (Signed)
Refill done.  

## 2011-10-03 ENCOUNTER — Ambulatory Visit (INDEPENDENT_AMBULATORY_CARE_PROVIDER_SITE_OTHER): Payer: Medicare Other | Admitting: Internal Medicine

## 2011-10-03 ENCOUNTER — Encounter: Payer: Self-pay | Admitting: Internal Medicine

## 2011-10-03 VITALS — BP 138/88 | HR 64 | Temp 97.5°F | Ht 72.0 in | Wt 212.0 lb

## 2011-10-03 DIAGNOSIS — E785 Hyperlipidemia, unspecified: Secondary | ICD-10-CM

## 2011-10-03 DIAGNOSIS — Z0000 Encounter for general adult medical examination without abnormal findings: Secondary | ICD-10-CM

## 2011-10-03 DIAGNOSIS — I10 Essential (primary) hypertension: Secondary | ICD-10-CM

## 2011-10-03 DIAGNOSIS — R209 Unspecified disturbances of skin sensation: Secondary | ICD-10-CM

## 2011-10-03 LAB — BASIC METABOLIC PANEL
BUN: 27 mg/dL — ABNORMAL HIGH (ref 6–23)
Calcium: 9 mg/dL (ref 8.4–10.5)
Creatinine, Ser: 1.5 mg/dL (ref 0.4–1.5)

## 2011-10-03 LAB — TSH: TSH: 0.53 u[IU]/mL (ref 0.35–5.50)

## 2011-10-03 LAB — LIPID PANEL
Cholesterol: 229 mg/dL — ABNORMAL HIGH (ref 0–200)
Triglycerides: 221 mg/dL — ABNORMAL HIGH (ref 0.0–149.0)

## 2011-10-03 NOTE — Assessment & Plan Note (Signed)
On neurontin, considering d/c it if sx improved after back surgery

## 2011-10-03 NOTE — Assessment & Plan Note (Addendum)
Td 2009 pneumonia shot-- at age 71 per patient  shingles shot 10-09-07  Cscope @ age 74 in [place] per patient  (no report available ), repeated Cscope w/  Dr Russella Dar 06-2010 : +polyps, repeated Cscope was  recommended to be ~06-2011 Plan: rec Cscope in few months once he is recovered from surgery counseled: diet, exercise   Orders: Medicare -1st Annual Wellness Visit (G0

## 2011-10-03 NOTE — Assessment & Plan Note (Signed)
Well controlled  Labs  

## 2011-10-03 NOTE — Progress Notes (Signed)
  Subjective:    Patient ID: [name], male    DOB: 10-19-1940, 70 y.o.   MRN: [id number]  HPI Here for Medicare AWV: 1. Risk factors based on Past M, S, F history: yes  2. Physical Activities: to start rehab tomorrow after back surgery 12-12 3. Depression/mood: denies, no problems notes  4. Hearing: decreased hearing, slt worse ---> s/p eval, was told hearing aid won't help 5. ADL's: totally independent  6. Fall Risk: moderate risk , to start rehab/PT soon 7. Home Safety: feels safe at home  8. Height, weight, &visual acuity: see VS, vision corrected  9. Counseling: yes  10. Labs ordered based on risk factors: yes   11.           Referral Coordination: if needed  12.           Care Plan-- see a/p  13.            Cognitive Assessment: alertness, memory and motor skills seem  appropriate  in addition to above, we also discussed the following....  high cholesterol -- off simva, on fenofibrate  HTN - reports good  ambulatory BPs , good medication compliance  HYPOGONADISM, on  HRT per urology S/p a lumbar laminectomy-- recovering well   Past Medical History: HYPERTENSION , Dx aprox 2000 HYPERLIPIDEMIA  DIVERTICULITIS, HX OF 2002,admitted to the hospital one time, status-post two outpatient episodes HYPOGONADISM, MALE  ED PARESTHESIAS  LE,  per neurosurgery notes a combination of neuropathy and chronic L5 radiculopathy, on neurontin. F/u also w/ Dr Clarisse Gouge Prostate cancer, hx of (08/2008)  Past Surgical History: rt shoulder surgery (2008) back surgery (2006) melanoma removed left arm (2005) Prostatectomy (09/13/2008) Lumbar laminectomie 07-2011   Family History: F lived until 71 y/o CAD - F (MI age 26) HTN - F, M DM - M, bro stroke - no colon Ca - no prostate Ca - bro, F  Social History: Married, 2 kids in [place] since '84 Retired, to start a part time busines Former Smoker in the 80s Alcohol use-yes (moderately 1 drinks/wk) exercise--no recently d/t back  surgery diet healthy most of the time per patient    Review of Systems  Constitutional: Negative for fever and fatigue.  Respiratory: Negative for cough.   Cardiovascular: Negative for chest pain and palpitations.  Gastrointestinal: Negative for abdominal pain and blood in stool.       Occ bloated , gas-x helps   Genitourinary: Negative for hematuria and difficulty urinating.       Occ incontinence       Objective:   Physical Exam  Constitutional: He is oriented to person, place, and time. He appears well-developed and well-nourished.  HENT:  Head: Normocephalic and atraumatic.  Neck: No thyromegaly present.       Nl carotid pulse   Cardiovascular: Normal rate, regular rhythm and normal heart sounds.   No murmur heard. Pulmonary/Chest: Effort normal and breath sounds normal. No respiratory distress. He has no wheezes. He has no rales.  Musculoskeletal: He exhibits no edema.  Neurological: He is alert and oriented to person, place, and time.  Psychiatric: He has a normal mood and affect. His behavior is normal. Judgment and thought content normal.      Assessment & Plan:

## 2011-10-03 NOTE — Assessment & Plan Note (Signed)
Due to aches pt self discontinue simva several months ago, was Rx pravachol but not taking it at present, reason? Plan: Labs

## 2011-10-10 ENCOUNTER — Other Ambulatory Visit: Payer: Self-pay | Admitting: *Deleted

## 2011-10-10 ENCOUNTER — Encounter: Payer: Self-pay | Admitting: *Deleted

## 2011-10-10 MED ORDER — PRAVASTATIN SODIUM 20 MG PO TABS: 20.0000 mg | ORAL_TABLET | Freq: Every day | ORAL | Status: DC

## 2011-10-22 ENCOUNTER — Other Ambulatory Visit: Payer: Self-pay | Admitting: Internal Medicine

## 2011-10-22 NOTE — Telephone Encounter (Signed)
Refill done.  

## 2011-11-12 ENCOUNTER — Ambulatory Visit (INDEPENDENT_AMBULATORY_CARE_PROVIDER_SITE_OTHER): Payer: Medicare Other | Admitting: Internal Medicine

## 2011-11-12 VITALS — BP 134/78 | HR 63 | Temp 97.5°F | Wt 216.0 lb

## 2011-11-12 DIAGNOSIS — E785 Hyperlipidemia, unspecified: Secondary | ICD-10-CM

## 2011-11-12 DIAGNOSIS — K645 Perianal venous thrombosis: Secondary | ICD-10-CM

## 2011-11-12 NOTE — Progress Notes (Signed)
  Subjective:    Patient ID: [name], male    DOB: 01/27/1941, 71 y.o.   MRN: [id number]  HPI Acute visit He has a history of remote hemorrhoids, started to notice some bleeding 4 days ago, bleeding is mild, only streaks in a pad. He took pain medication due to back surgery up to a couple of weeks ago and he admits to some constipation lately Also, he was prescribed pravachol based on the last cholesterol panel, he took it briefly and self discontinued because "problems with memory and aches "  Past Medical History:  HYPERTENSION , Dx aprox 2000  HYPERLIPIDEMIA  DIVERTICULITIS, HX OF 2002,admitted to the hospital one time, status-post two outpatient episodes  HYPOGONADISM, MALE  ED  PARESTHESIAS LE, per neurosurgery notes a combination of neuropathy and chronic L5 radiculopathy, on neurontin. F/u also w/ Dr Clarisse Gouge  Prostate cancer, hx of (08/2008)  Past Surgical History:  rt shoulder surgery (2008)  back surgery (2006)  melanoma removed left arm (2005)  Prostatectomy (09/13/2008)  Lumbar laminectomie 07-2011    Review of Systems He admits to very mild itching in the area, some pain but not severe. No abdominal pain, nausea or vomiting. he had a colonoscopy 06/2010.     Objective:   Physical Exam A, ox3, no apparent distress. Abdomen, soft nontender, not distended. Digital rectal exam, he has a 1.5 cm, external , thrombosed hemorrhoid at 3 oclock, no active bleeding. Anus normal.       Assessment & Plan:  Thrombosed hemorrhoid to be seen by surgery tomorrow afternoon acutely. See instructions

## 2011-11-12 NOTE — Patient Instructions (Signed)
Sitz baths twice a day Colace 100 mg over-the-counter one daily. Over-the-counter NUPERCAINAL  ointment as needed for pain If you have severe bleeding, you need to go to the emergency room. Also call me if the pain is severe or you have fever. Please see the surgeon tomorrow at 3 pm Dr Dwain Sarna  [address], [place], [place] [address] [phone]

## 2011-11-13 ENCOUNTER — Encounter (INDEPENDENT_AMBULATORY_CARE_PROVIDER_SITE_OTHER): Payer: Self-pay | Admitting: General Surgery

## 2011-11-13 ENCOUNTER — Encounter: Payer: Self-pay | Admitting: Internal Medicine

## 2011-11-13 ENCOUNTER — Ambulatory Visit (INDEPENDENT_AMBULATORY_CARE_PROVIDER_SITE_OTHER): Payer: Medicare Other | Admitting: General Surgery

## 2011-11-13 VITALS — BP 132/82 | HR 70 | Temp 97.8°F | Resp 18 | Ht 72.0 in | Wt 212.0 lb

## 2011-11-13 DIAGNOSIS — K645 Perianal venous thrombosis: Secondary | ICD-10-CM

## 2011-11-13 NOTE — Progress Notes (Signed)
Subjective:     Patient ID: [name], male   DOB: [birth year], 71 y.o.   MRN: [id number]  HPI This is a 71 year old male who presents with a four-day history of a perianal mass. On Monday he started noticing bleeding as well. He is not really have any tenderness now. He was seen by his primary care physician referred over for evaluation. He had back surgery at the end of December. He has some hard stools then and did some straining. This has all gotten better recently. He has been having normal bowel movements or any difficulty. He is not to do any straining. He does read on the toilet. He has a history of a colonoscopy.  Review of Systems  Constitutional: Negative for fever, chills and unexpected weight change.  HENT: Positive for hearing loss. Negative for congestion, sore throat, trouble swallowing and voice change.   Eyes: Negative for visual disturbance.  Respiratory: Negative for cough and wheezing.   Cardiovascular: Negative for chest pain, palpitations and leg swelling.  Gastrointestinal: Positive for anal bleeding and rectal pain. Negative for nausea, vomiting, abdominal pain, diarrhea, constipation, blood in stool and abdominal distention.  Genitourinary: Negative for hematuria and difficulty urinating.  Musculoskeletal: Negative for arthralgias.  Skin: Negative for rash and wound.  Neurological: Negative for seizures, syncope, weakness and headaches.  Hematological: Negative for adenopathy. Does not bruise/bleed easily.  Psychiatric/Behavioral: Negative for confusion.       Objective:   Physical Exam  Vitals reviewed. Constitutional: He appears well-developed and well-nourished.  Genitourinary: Rectal exam shows external hemorrhoid. Rectal exam shows no tenderness.          Assessment:     Thrombosed external hemorrhoid    Plan:     This area has already decompressed  I did remove the blood clot that was extruding. This otherwise is soft and viable. I think this  will heal with taking some sitz baths. I asked him to come back and see me as needed.

## 2011-11-13 NOTE — Patient Instructions (Signed)

## 2011-11-17 ENCOUNTER — Other Ambulatory Visit: Payer: Self-pay | Admitting: Internal Medicine

## 2011-11-18 NOTE — Telephone Encounter (Signed)
Refill done.  

## 2011-12-05 ENCOUNTER — Telehealth: Payer: Self-pay | Admitting: Internal Medicine

## 2011-12-05 MED ORDER — ALPRAZOLAM 0.5 MG PO TABS: 0.5000 mg | ORAL_TABLET | Freq: Every evening | ORAL | Status: DC | PRN

## 2011-12-05 NOTE — Telephone Encounter (Signed)
OK to refill

## 2011-12-05 NOTE — Telephone Encounter (Signed)
Refill done.  

## 2011-12-05 NOTE — Telephone Encounter (Signed)
30, 3 RF 

## 2011-12-05 NOTE — Telephone Encounter (Signed)
Refill: Alprazolam 0.5 mg tablet. Take 1 tablet at bedtime as needed for sleep.

## 2011-12-17 ENCOUNTER — Other Ambulatory Visit: Payer: Self-pay | Admitting: Internal Medicine

## 2011-12-17 NOTE — Telephone Encounter (Signed)
Refill done.  

## 2011-12-31 ENCOUNTER — Encounter: Payer: Self-pay | Admitting: Internal Medicine

## 2011-12-31 ENCOUNTER — Ambulatory Visit (INDEPENDENT_AMBULATORY_CARE_PROVIDER_SITE_OTHER): Payer: Medicare Other | Admitting: Internal Medicine

## 2011-12-31 VITALS — BP 138/86 | HR 62 | Temp 97.6°F | Wt 206.0 lb

## 2011-12-31 DIAGNOSIS — R05 Cough: Secondary | ICD-10-CM

## 2011-12-31 DIAGNOSIS — R059 Cough, unspecified: Secondary | ICD-10-CM

## 2011-12-31 DIAGNOSIS — H00019 Hordeolum externum unspecified eye, unspecified eyelid: Secondary | ICD-10-CM

## 2011-12-31 MED ORDER — GENTAMICIN SULFATE 0.3 % OP SOLN: 2.0000 [drp] | Freq: Four times a day (QID) | OPHTHALMIC | Status: AC

## 2011-12-31 MED ORDER — DOXYCYCLINE HYCLATE 100 MG PO TABS: 100.0000 mg | ORAL_TABLET | Freq: Two times a day (BID) | ORAL | Status: AC

## 2011-12-31 NOTE — Patient Instructions (Signed)
Warm compress to left eye, drops x 1 week, call if no better , call any time if worse  Rest, fluids , tylenol For cough, take Mucinex DM twice a day as needed  Take the antibiotic as prescribed  (doxycycline) Call if no better in few days Call anytime if the symptoms are severe

## 2011-12-31 NOTE — Progress Notes (Signed)
  Subjective:    Patient ID: [name], male    DOB: 06/13/1941, 71 y.o.   MRN: [id number]  HPI Acute visit  Cough x 2 week, dry, increase by deep breathing or talking  Also 2 days h/o of a bump in the L eye. No pain or d/c, vision at baseline .  Past Medical History:  HYPERTENSION , Dx aprox 2000  HYPERLIPIDEMIA  DIVERTICULITIS, HX OF 2002,admitted to the hospital one time, status-post two outpatient episodes  HYPOGONADISM, MALE  ED  PARESTHESIAS LE, per neurosurgery notes a combination of neuropathy and chronic L5 radiculopathy, on neurontin. F/u also w/ Dr Clarisse Gouge  Prostate cancer, hx of (08/2008)  Past Surgical History:  rt shoulder surgery (2008)  back surgery (2006)  melanoma removed left arm (2005)  Prostatectomy (09/13/2008)  Lumbar laminectomie 07-2011    Review of Systems No F C No wheezing, on ACEi x years  No GERD sx     Objective:   Physical Exam  Constitutional: He appears well-developed and well-nourished. No distress.  Eyes:    Cardiovascular: Normal rate, regular rhythm, normal heart sounds and intact distal pulses.   Pulmonary/Chest: Effort normal. No respiratory distress. He has no wheezes. He has no rales.       Few ronchi w/ cough  Skin: He is not diaphoretic.       Assessment & Plan:   Cough for 2 weeks with some rhonchi, likely a mild bronchitis. Plan: Doxycycline. See instructions Eye lesion, likely a hordeolum, see instructions.

## 2012-01-16 ENCOUNTER — Encounter: Payer: Self-pay | Admitting: Internal Medicine

## 2012-01-16 ENCOUNTER — Ambulatory Visit (INDEPENDENT_AMBULATORY_CARE_PROVIDER_SITE_OTHER): Payer: Medicare Other | Admitting: Internal Medicine

## 2012-01-16 VITALS — BP 136/74 | HR 58 | Temp 98.1°F | Wt 209.0 lb

## 2012-01-16 DIAGNOSIS — H00019 Hordeolum externum unspecified eye, unspecified eyelid: Secondary | ICD-10-CM

## 2012-01-16 NOTE — Assessment & Plan Note (Signed)
Needs to be seen by ophthalmology, referral arranged

## 2012-01-16 NOTE — Progress Notes (Signed)
  Subjective:    Patient ID: [name], male    DOB: 11-07-1940, 71 y.o.   MRN: [id number]  HPI Visit Was seen recently with an eye lesion, not getting better with antibiotic drops.  Past Medical History:   HYPERTENSION , Dx aprox 2000   HYPERLIPIDEMIA   DIVERTICULITIS, HX OF 2002,admitted to the hospital one time, status-post two outpatient episodes   HYPOGONADISM, MALE   ED   PARESTHESIAS LE, per neurosurgery notes a combination of neuropathy and chronic L5 radiculopathy, on neurontin. F/u also w/ Dr Clarisse Gouge   Prostate cancer, hx of (08/2008)   Past Surgical History:   rt shoulder surgery (2008)   back surgery (2006)   melanoma removed left arm (2005)   Prostatectomy (09/13/2008)   Lumbar laminectomie 07-2011    Review of Systems Vision is unchanged, no discharge, mild discomfort in the eye.    Objective:   Physical Exam  Alert oriented x3, no apparent distress. Previously described lesion in the inner left lower eye lid is now 5x5 mm    Assessment & Plan:

## 2012-03-19 DIAGNOSIS — I4892 Unspecified atrial flutter: Secondary | ICD-10-CM

## 2012-03-19 HISTORY — DX: Unspecified atrial flutter: I48.92

## 2012-04-02 ENCOUNTER — Ambulatory Visit: Payer: Medicare Other | Admitting: Internal Medicine

## 2012-04-09 ENCOUNTER — Encounter: Payer: Self-pay | Admitting: Cardiology

## 2012-04-09 ENCOUNTER — Ambulatory Visit (INDEPENDENT_AMBULATORY_CARE_PROVIDER_SITE_OTHER): Payer: Medicare Other | Admitting: Internal Medicine

## 2012-04-09 ENCOUNTER — Encounter: Payer: Self-pay | Admitting: Internal Medicine

## 2012-04-09 VITALS — BP 140/78 | HR 59 | Temp 98.7°F | Wt 213.0 lb

## 2012-04-09 DIAGNOSIS — E785 Hyperlipidemia, unspecified: Secondary | ICD-10-CM

## 2012-04-09 DIAGNOSIS — E291 Testicular hypofunction: Secondary | ICD-10-CM

## 2012-04-09 DIAGNOSIS — Z8546 Personal history of malignant neoplasm of prostate: Secondary | ICD-10-CM

## 2012-04-09 DIAGNOSIS — I10 Essential (primary) hypertension: Secondary | ICD-10-CM

## 2012-04-09 DIAGNOSIS — M1990 Unspecified osteoarthritis, unspecified site: Secondary | ICD-10-CM

## 2012-04-09 DIAGNOSIS — Z79899 Other long term (current) drug therapy: Secondary | ICD-10-CM

## 2012-04-09 DIAGNOSIS — I4892 Unspecified atrial flutter: Secondary | ICD-10-CM

## 2012-04-09 DIAGNOSIS — I4891 Unspecified atrial fibrillation: Secondary | ICD-10-CM | POA: Insufficient documentation

## 2012-04-09 MED ORDER — DILTIAZEM HCL ER COATED BEADS 240 MG PO CP24: 240.0000 mg | ORAL_CAPSULE | Freq: Every day | ORAL | Status: DC

## 2012-04-09 NOTE — Assessment & Plan Note (Addendum)
Currently on fenofibrate only. plan:  Labs cancelled as he was dx w/  A flutter today Encouraged healthy lifestyle. Has not been exercising as much lately.

## 2012-04-09 NOTE — Progress Notes (Signed)
  Subjective:    Patient ID: [name], male    DOB: 12-26-1940, 71 y.o.   MRN: [id number]  HPI Routine office visit Hypertension, good medication compliance, ambulatory BPs always less than 140/78. High cholesterol, only taking fenofibrate, intolerant to statins. See assessment and plan. Chronic back pain, better since surgery 07/2011. See past medical history.  Past Medical History:   HTN, Dx aprox 2000   HYPERLIPIDEMIA   DIVERTICULITIS, HX OF 2002,admitted to the hospital one time, status-post two outpatient episodes   HYPOGONADISM, MALE   ED   PARESTHESIAS LE, per neurosurgery notes a combination of neuropathy and chronic L5 radiculopathy, on neurontin. Used to see Dr Clarisse Gouge , then Dr Ethelene Hal, s/p shot and surgery  Prostate cancer, hx of (08/2008)    Past Surgical History:   rt shoulder surgery (2008)   back surgery (2006)   melanoma removed left arm (2005)   Prostatectomy (09/13/2008)   Lumbar laminectome 07-2011    Review of Systems No chest pain or shortness of breath No nausea, vomiting, diarrhea. No dizziness, headache, facial paresthesias.     Objective:   Physical Exam  General -- alert, well-developed, and overweight appearing. No apparent distress.  Neck --no  thyromegaly Lungs -- normal respiratory effort, no intercostal retractions, no accessory muscle use, and normal breath sounds.   Heart-- regular?, no murmur, and no gallop.   Extremities-- no pretibial edema bilaterally  Neurologic-- alert & oriented X3 and strength normal in all extremities. Psych-- Cognition and judgment appear intact. Alert and cooperative with normal attention span and concentration.  not anxious appearing and not depressed appearing.      Assessment & Plan:

## 2012-04-09 NOTE — Assessment & Plan Note (Addendum)
Pt  was here for a regular checkup, was noted to have an irregular pulse, EKGs showed atrial flutter. Case discussed with cardiology, he is asymptomatic. Patient aware of the diagnoses, increased risk of strokes discussed. I review with him red flag symptoms of stroke and heart problems. Plan as discussed w/ cards : Labs Start aspirin 325 Change amlodipine to Cardizem CD 240 mg daily. Cardiology will call him and he will be seeing in the next few days.

## 2012-04-09 NOTE — Assessment & Plan Note (Signed)
Chronic low back pain doing great since surgery 07/2011. Currently not taking any painkillers, still uses Neurontin for neuropathy.

## 2012-04-09 NOTE — Assessment & Plan Note (Addendum)
BP well-controlled, however his pulse was irregular, EKGs showed  A- flutter. See below

## 2012-04-09 NOTE — Patient Instructions (Signed)
Cardiology will call you in the next couple of days Stop amlodipine, start diltiazem Start aspirin 325 milligrams daily Call anytime or go to the emergency room if you have chest pain, shortness of breath, swelling in your  Come back in 3 months for a routine, general exam

## 2012-04-10 LAB — COMPREHENSIVE METABOLIC PANEL
AST: 20 U/L (ref 0–37)
Albumin: 3.8 g/dL (ref 3.5–5.2)
BUN: 22 mg/dL (ref 6–23)
Calcium: 8.9 mg/dL (ref 8.4–10.5)
Chloride: 108 mEq/L (ref 96–112)
Glucose, Bld: 100 mg/dL — ABNORMAL HIGH (ref 70–99)
Potassium: 3.8 mEq/L (ref 3.5–5.1)
Sodium: 141 mEq/L (ref 135–145)
Total Protein: 6.5 g/dL (ref 6.0–8.3)

## 2012-04-10 LAB — CBC WITH DIFFERENTIAL/PLATELET
Basophils Absolute: 0 10*3/uL (ref 0.0–0.1)
Basophils Relative: 0.8 % (ref 0.0–3.0)
Eosinophils Absolute: 0.2 10*3/uL (ref 0.0–0.7)
Lymphocytes Relative: 26.2 % (ref 12.0–46.0)
MCHC: 33.3 g/dL (ref 30.0–36.0)
MCV: 91.2 fl (ref 78.0–100.0)
Monocytes Absolute: 0.6 10*3/uL (ref 0.1–1.0)
Neutrophils Relative %: 60.2 % (ref 43.0–77.0)
Platelets: 178 10*3/uL (ref 150.0–400.0)
RBC: 4.89 Mil/uL (ref 4.22–5.81)

## 2012-04-10 LAB — MAGNESIUM: Magnesium: 2.1 mg/dL (ref 1.5–2.5)

## 2012-04-10 LAB — TSH: TSH: 0.78 u[IU]/mL (ref 0.35–5.50)

## 2012-04-13 ENCOUNTER — Telehealth: Payer: Self-pay | Admitting: *Deleted

## 2012-04-13 NOTE — Telephone Encounter (Signed)
Pt request copy of labs to be faxed to [phone]. Pt aware labs faxed.

## 2012-04-15 ENCOUNTER — Ambulatory Visit (INDEPENDENT_AMBULATORY_CARE_PROVIDER_SITE_OTHER): Payer: Medicare Other | Admitting: Cardiology

## 2012-04-15 ENCOUNTER — Encounter: Payer: Self-pay | Admitting: Cardiology

## 2012-04-15 VITALS — BP 140/78 | HR 110 | Ht 72.0 in | Wt 211.4 lb

## 2012-04-15 DIAGNOSIS — E7800 Pure hypercholesterolemia, unspecified: Secondary | ICD-10-CM

## 2012-04-15 DIAGNOSIS — I10 Essential (primary) hypertension: Secondary | ICD-10-CM

## 2012-04-15 DIAGNOSIS — I4892 Unspecified atrial flutter: Secondary | ICD-10-CM

## 2012-04-15 MED ORDER — APIXABAN 5 MG PO TABS: 5.0000 mg | ORAL_TABLET | Freq: Two times a day (BID) | ORAL | Status: DC

## 2012-04-15 NOTE — Patient Instructions (Signed)
We will schedule you for an Echocardiogram  We will start you on Elquis for anticoagulation.  Stop taking ASA.   I will see you again in 4 weeks

## 2012-04-15 NOTE — Progress Notes (Signed)
[name] Date of Birth: 1941-03-01 Medical Record #[id number]  History of Present Illness: Mr. Lepore is seen at the request of Dr. Drue Novel for evaluation of atrial flutter.he is a pleasant 71 year old white male who is seen for a routine physical this past week. He was found to be in atrial flutter with a rapid ventricular response and a rate of 122 beats per minute. Patient denies any prior history of arrhythmia. He apparently had a heart catheterization 10-15 years ago that was normal. He states that about 3 weeks ago he noted a couple of twinges in his chest but has really not been aware of any palpitations, dizziness, tachycardia, or shortness of breath. He feels quite well. He has a history of hypertension and hyperlipidemia. He was previously on amlodipine for blood pressure control this was recently switched to diltiazem. He has been taking aspirin daily. He has no known history of TIA or stroke. He has no bleeding disorders.  Current Outpatient Prescriptions on File Prior to Visit  Medication Sig Dispense Refill  . ALPRAZolam (XANAX) 0.5 MG tablet Take 1 tablet (0.5 mg total) by mouth at bedtime as needed.  30 tablet  3  . aspirin 325 MG tablet Take 325 mg by mouth daily.      . benazepril (LOTENSIN) 40 MG tablet Take 40 mg by mouth every evening.       . Cyanocobalamin (VITAMIN B-12 PO) Take 1 tablet by mouth daily.       Marland Kitchen diltiazem (CARTIA XT) 240 MG 24 hr capsule Take 1 capsule (240 mg total) by mouth daily.  30 capsule  0  . fenofibrate 160 MG tablet Take 160 mg by mouth daily.       Marland Kitchen gabapentin (NEURONTIN) 300 MG capsule Take 300 mg by mouth 2 (two) times daily.       . methocarbamol (ROBAXIN) 500 MG tablet Take 500 mg by mouth at bedtime as needed.       . Multiple Vitamins-Minerals (MULTIVITAMIN,TX-MINERALS) tablet Take 1 tablet by mouth daily.       . Testosterone (ANDROGEL PUMP) 1.25 GM/ACT (1%) GEL Place onto the skin.      Marland Kitchen apixaban (ELIQUIS) 5 MG TABS tablet Take 1 tablet  (5 mg total) by mouth 2 (two) times daily.  60 tablet  6  . diltiazem (TIAZAC) 240 MG 24 hr capsule       . DISCONTD: pravastatin (PRAVACHOL) 20 MG tablet Take 1 tablet (20 mg total) by mouth daily.  30 tablet  6    No Known Allergies  Past Medical History  Diagnosis Date  . Headache     migraines x 3 in life  . Cancer     melanoma left forearm 2005- no chemo, prostate  . Hypertension   . Bleeding hemorrhoids   . Hyperlipidemia   . Hearing loss   . Rectal bleeding   . Blood in stool   . Rectal pain   . Atrial flutter     Past Surgical History  Procedure Date  . Melanoma excision   . Cardiac catheterization     15 years ago  . Colonoscopy w/ polypectomy   . Tonsillectomy 1947  . Rotator cuff repair     right  . Arhtroscopy  right knee 2010  . Back surgery 07/2006    lumbar discectomy  . Lumbar laminectomy/decompression microdiscectomy 08/16/2011    Procedure: LUMBAR LAMINECTOMY/DECOMPRESSION MICRODISCECTOMY;  Surgeon: Alvy Beal;  Location: [facility];  Service: Orthopedics;  Laterality: N/A;  Lumbar Decompression and Foraminotomy L4-S1    . Prostatectomy 07/2008    History  Smoking status  . Former Smoker -- 1.0 packs/day for 20 years  . Types: Cigarettes  . Quit date: 08/08/1984  Smokeless tobacco  . Never Used    History  Alcohol Use  . Yes    socially   2 beer or mixed drinks week    History reviewed. No pertinent family history.  Review of Systems: The review of systems is positive for previous back surgery in December. Prior to this he was working out 3 days a week for an hour and a half at the gym. He just hasn't resumed his exercise since his surgery.  All other systems were reviewed and are negative.  Physical Exam: BP 140/78  Pulse 110  Ht 6' (1.829 m)  Wt 211 lb 6.4 oz (95.89 kg)  BMI 28.67 kg/m2 He is a pleasant, overweight white male in no acute distress.The patient is alert and oriented x 3.  I did a pulse of 84 beats per minute.The mood  and affect are normal.  The skin is warm and dry.  Color is normal.  The HEENT exam reveals that the sclera are nonicteric.  The mucous membranes are moist.  The carotids are 2+ without bruits.  There is no thyromegaly.  There is no JVD.  The lungs are clear.  The chest wall is non tender.  The heart exam reveals an irregular rate with a normal S1 and S2.  There are no murmurs, gallops, or rubs.  The PMI is not displaced.   Abdominal exam reveals good bowel sounds.  There is no guarding or rebound.  There is no hepatosplenomegaly or tenderness.  There are no masses.  Exam of the legs reveal no clubbing, cyanosis, or edema.  The legs are without rashes.  The distal pulses are intact.  Cranial nerves II - XII are intact.  Motor and sensory functions are intact.  The gait is normal.  LABORATORY DATA: ECG today again demonstrates atrial flutter with a ventricular response moderate 1 beats per minute. He has nonspecific ST abnormality.  Laboratory data from 04/09/2012 shows a normal chemistry profile and CBC. TSH and magnesium levels were normal.  Assessment / Plan: 1. Atrial flutter. Persistent. Rate control has improved with the addition of diltiazem. Patient is not significantly symptomatic. He has a [place] score of 2 based on his age and history of hypertension. Because of this I have recommended anticoagulation. We have reviewed options of Coumadin versus the newer anticoagulants. After this review we decided to place him on Elquis 5 mg twice a day. I recommended he stop taking aspirin. He should also avoid nonsteroidal anti-inflammatory drugs. We will continue with diltiazem for rate control. After 4 weeks of anticoagulation we will consider options for restoration of sinus rhythm. Since this is his first episode and he is minimally symptomatic however recommend elective cardioversion as an initial approach. If he had recurrent atrial flutter following this then we could decide between rate control only,  antiarrhythmic drug therapy, or ablative therapies. I will see him back in 3-4 weeks to review again. We will also schedule him for an echocardiogram to complete his evaluation.  2. Hypertension.  3. Hyperlipidemia.

## 2012-04-21 ENCOUNTER — Ambulatory Visit (HOSPITAL_COMMUNITY): Payer: Medicare Other | Attending: Cardiology

## 2012-04-21 DIAGNOSIS — I319 Disease of pericardium, unspecified: Secondary | ICD-10-CM | POA: Insufficient documentation

## 2012-04-21 DIAGNOSIS — I10 Essential (primary) hypertension: Secondary | ICD-10-CM

## 2012-04-21 DIAGNOSIS — I4892 Unspecified atrial flutter: Secondary | ICD-10-CM

## 2012-04-21 DIAGNOSIS — Z87891 Personal history of nicotine dependence: Secondary | ICD-10-CM | POA: Insufficient documentation

## 2012-04-21 DIAGNOSIS — I379 Nonrheumatic pulmonary valve disorder, unspecified: Secondary | ICD-10-CM | POA: Insufficient documentation

## 2012-04-21 DIAGNOSIS — I359 Nonrheumatic aortic valve disorder, unspecified: Secondary | ICD-10-CM | POA: Insufficient documentation

## 2012-04-21 DIAGNOSIS — I079 Rheumatic tricuspid valve disease, unspecified: Secondary | ICD-10-CM | POA: Insufficient documentation

## 2012-04-21 DIAGNOSIS — E7800 Pure hypercholesterolemia, unspecified: Secondary | ICD-10-CM

## 2012-04-21 NOTE — Progress Notes (Signed)
Echocardiogram performed.  

## 2012-05-06 ENCOUNTER — Other Ambulatory Visit: Payer: Self-pay | Admitting: Internal Medicine

## 2012-05-12 ENCOUNTER — Ambulatory Visit (INDEPENDENT_AMBULATORY_CARE_PROVIDER_SITE_OTHER): Payer: Medicare Other | Admitting: Cardiology

## 2012-05-12 ENCOUNTER — Ambulatory Visit: Payer: Medicare Other | Admitting: Nurse Practitioner

## 2012-05-12 ENCOUNTER — Other Ambulatory Visit: Payer: Self-pay | Admitting: Cardiology

## 2012-05-12 ENCOUNTER — Ambulatory Visit: Payer: Medicare Other | Admitting: Cardiology

## 2012-05-12 ENCOUNTER — Encounter: Payer: Self-pay | Admitting: Cardiology

## 2012-05-12 VITALS — BP 149/80 | HR 62 | Ht 72.0 in | Wt 209.8 lb

## 2012-05-12 DIAGNOSIS — I4892 Unspecified atrial flutter: Secondary | ICD-10-CM

## 2012-05-12 DIAGNOSIS — I10 Essential (primary) hypertension: Secondary | ICD-10-CM

## 2012-05-12 DIAGNOSIS — E785 Hyperlipidemia, unspecified: Secondary | ICD-10-CM

## 2012-05-12 LAB — CBC WITH DIFFERENTIAL/PLATELET
Basophils Relative: 0.7 % (ref 0.0–3.0)
Eosinophils Absolute: 0.2 10*3/uL (ref 0.0–0.7)
Eosinophils Relative: 3.1 % (ref 0.0–5.0)
Hemoglobin: 16 g/dL (ref 13.0–17.0)
Lymphocytes Relative: 29.5 % (ref 12.0–46.0)
MCHC: 33 g/dL (ref 30.0–36.0)
Monocytes Relative: 8 % (ref 3.0–12.0)
Neutro Abs: 3.7 10*3/uL (ref 1.4–7.7)
Neutrophils Relative %: 58.7 % (ref 43.0–77.0)
RBC: 5.31 Mil/uL (ref 4.22–5.81)
WBC: 6.3 10*3/uL (ref 4.5–10.5)

## 2012-05-12 LAB — BASIC METABOLIC PANEL
CO2: 25 mEq/L (ref 19–32)
Calcium: 9.1 mg/dL (ref 8.4–10.5)
Creatinine, Ser: 1.5 mg/dL (ref 0.4–1.5)
GFR: 49.76 mL/min — ABNORMAL LOW (ref >60.00)
Sodium: 143 mEq/L (ref 135–145)

## 2012-05-12 LAB — PROTIME-INR: INR: 1.2 ratio — ABNORMAL HIGH (ref 0.8–1.0)

## 2012-05-12 MED ORDER — APIXABAN 5 MG PO TABS: 5.0000 mg | ORAL_TABLET | Freq: Two times a day (BID) | ORAL | Status: DC

## 2012-05-12 NOTE — Progress Notes (Signed)
[name] Date of Birth: 06-05-1941 Medical Record #[id number]  History of Present Illness: [name] is seen for followup of atrial flutter. He reports that he has experienced no symptoms of palpitations, shortness of breath, or chest pain. His energy level has been good. He does not monitor his heart rate or blood pressure. He has done well with his anticoagulation with Eliquis.  Current Outpatient Prescriptions on File Prior to Visit  Medication Sig Dispense Refill  . ALPRAZolam (XANAX) 0.5 MG tablet Take 1 tablet (0.5 mg total) by mouth at bedtime as needed.  30 tablet  3  . benazepril (LOTENSIN) 40 MG tablet Take 40 mg by mouth every evening.       . Cyanocobalamin (VITAMIN B-12 PO) Take 1 tablet by mouth daily.       Marland Kitchen diltiazem (TIAZAC) 240 MG 24 hr capsule TAKE 1 CAPSULE (240 MG TOTAL) BY MOUTH DAILY.  30 capsule  3  . fenofibrate 160 MG tablet Take 160 mg by mouth daily.       Marland Kitchen gabapentin (NEURONTIN) 300 MG capsule Take 300 mg by mouth 2 (two) times daily.       . methocarbamol (ROBAXIN) 500 MG tablet Take 500 mg by mouth at bedtime as needed.       . Multiple Vitamins-Minerals (MULTIVITAMIN,TX-MINERALS) tablet Take 1 tablet by mouth daily.       . Testosterone (ANDROGEL PUMP) 1.25 GM/ACT (1%) GEL Place onto the skin.      Marland Kitchen DISCONTD: diltiazem (CARTIA XT) 240 MG 24 hr capsule Take 1 capsule (240 mg total) by mouth daily.  30 capsule  0  . DISCONTD: diltiazem (TIAZAC) 240 MG 24 hr capsule       . DISCONTD: pravastatin (PRAVACHOL) 20 MG tablet Take 1 tablet (20 mg total) by mouth daily.  30 tablet  6  . DISCONTD: zolpidem (AMBIEN) 10 MG tablet Take 1 tablet (10 mg total) by mouth at bedtime as needed for sleep.  30 tablet  1    No Known Allergies  Past Medical History  Diagnosis Date  . Headache     migraines x 3 in life  . Cancer     melanoma left forearm 2005- no chemo, prostate  . Hypertension   . Bleeding hemorrhoids   . Hyperlipidemia   . Hearing loss   .  Rectal bleeding   . Blood in stool   . Rectal pain   . Atrial flutter     Past Surgical History  Procedure Date  . Melanoma excision   . Cardiac catheterization     15 years ago  . Colonoscopy w/ polypectomy   . Tonsillectomy 1947  . Rotator cuff repair     right  . Arhtroscopy  right knee 2010  . Back surgery 07/2006    lumbar discectomy  . Lumbar laminectomy/decompression microdiscectomy 08/16/2011    Procedure: LUMBAR LAMINECTOMY/DECOMPRESSION MICRODISCECTOMY;  Surgeon: [name];  Location: [facility];  Service: Orthopedics;  Laterality: N/A;  Lumbar Decompression and Foraminotomy L4-S1    . Prostatectomy 07/2008    History  Smoking status  . Former Smoker -- 1.0 packs/day for 20 years  . Types: Cigarettes  . Quit date: 08/08/1984  Smokeless tobacco  . Never Used    History  Alcohol Use  . Yes    socially   2 beer or mixed drinks week    History reviewed. No pertinent family history.  Review of Systems: As noted in history of present  illness All other systems were reviewed and are negative.  Physical Exam: BP 149/80  Pulse 62  Ht 6' (1.829 m)  Wt 209 lb 12.8 oz (95.165 kg)  BMI 28.45 kg/m2 He is a pleasant, overweight white male in no acute distress.The patient is alert and oriented x 3. Pulse is 84 by my exam and irregular.The mood and affect are normal.  The skin is warm and dry.  Color is normal.  The HEENT exam reveals that the sclera are nonicteric.  The mucous membranes are moist.  The carotids are 2+ without bruits.  There is no thyromegaly.  There is no JVD.  The lungs are clear.  The chest wall is non tender.  The heart exam reveals an irregular rate with a normal S1 and S2.  There are no murmurs, gallops, or rubs.  The PMI is not displaced.   Abdominal exam reveals good bowel sounds.  There is no guarding or rebound.  There is no hepatosplenomegaly or tenderness.  There are no masses.  Exam of the legs reveal no clubbing, cyanosis, or edema.  The legs  are without rashes.  The distal pulses are intact.  Cranial nerves II - XII are intact.  Motor and sensory functions are intact.  The gait is normal.  LABORATORY DATA: Echocardiogram demonstrates mild LVH with normal ejection fraction of 60-65%. There is mild left atrial enlargement.  Assessment / Plan: 1. Atrial flutter. Persistent. Rate control has improved with the addition of diltiazem. Patient is not significantly symptomatic. He has a [place] score of 2 based on his age and history of hypertension. He has been on Elquis 5 mg twice a day for the past month. We will proceed at this time with elective DC cardioversion. This is scheduled for next Monday. Procedure and risk were explained in detail with the patient and his wife.  2. Hypertension.  3. Hyperlipidemia.

## 2012-05-13 ENCOUNTER — Other Ambulatory Visit: Payer: Self-pay

## 2012-05-13 MED ORDER — APIXABAN 5 MG PO TABS: 5.0000 mg | ORAL_TABLET | Freq: Two times a day (BID) | ORAL | Status: DC

## 2012-05-17 MED ORDER — SODIUM CHLORIDE 0.9 % IV SOLN
INTRAVENOUS | Status: DC
  Administered 2012-05-18: 13:00:00 via INTRAVENOUS

## 2012-05-18 ENCOUNTER — Encounter (HOSPITAL_COMMUNITY): Payer: Self-pay | Admitting: *Deleted

## 2012-05-18 ENCOUNTER — Encounter (HOSPITAL_COMMUNITY): Payer: Self-pay | Admitting: Anesthesiology

## 2012-05-18 ENCOUNTER — Encounter (HOSPITAL_COMMUNITY)
Admission: RE | Disposition: A | Discharge status: Home / Self Care | Payer: Self-pay | Source: Ambulatory Visit | Attending: Cardiology

## 2012-05-18 ENCOUNTER — Encounter: Payer: Self-pay | Admitting: Gastroenterology

## 2012-05-18 ENCOUNTER — Ambulatory Visit (HOSPITAL_COMMUNITY)
Admission: RE | Admit: 2012-05-18 | Discharge: 2012-05-18 | Disposition: A | Discharge status: Home / Self Care | Payer: Medicare Other | Source: Ambulatory Visit | Attending: Cardiology | Admitting: Cardiology

## 2012-05-18 ENCOUNTER — Ambulatory Visit (HOSPITAL_COMMUNITY): Payer: Medicare Other | Admitting: Anesthesiology

## 2012-05-18 DIAGNOSIS — I4892 Unspecified atrial flutter: Secondary | ICD-10-CM | POA: Insufficient documentation

## 2012-05-18 HISTORY — PX: CARDIOVERSION: SHX1299

## 2012-05-18 HISTORY — DX: Other chronic pain: G89.29

## 2012-05-18 HISTORY — DX: Dorsalgia, unspecified: M54.9

## 2012-05-18 SURGERY — CARDIOVERSION
Anesthesia: General

## 2012-05-18 MED ORDER — LACTATED RINGERS IV SOLN
INTRAVENOUS | Status: DC | PRN
  Administered 2012-05-18: 13:00:00 via INTRAVENOUS

## 2012-05-18 MED ORDER — LIDOCAINE HCL (CARDIAC) 20 MG/ML IV SOLN
INTRAVENOUS | Status: DC | PRN
  Administered 2012-05-18: 50 mg via INTRAVENOUS

## 2012-05-18 MED ORDER — PROPOFOL 10 MG/ML IV BOLUS
INTRAVENOUS | Status: DC | PRN
  Administered 2012-05-18: 100 mg via INTRAVENOUS

## 2012-05-18 NOTE — H&P (View-Only) (Signed)
 [name] Date of Birth: 08/31/1940 Medical Record #[id number]  History of Present Illness: [name] is seen for followup of atrial flutter. He reports that he has experienced no symptoms of palpitations, shortness of breath, or chest pain. His energy level has been good. He does not monitor his heart rate or blood pressure. He has done well with his anticoagulation with Eliquis.  Current Outpatient Prescriptions on File Prior to Visit  Medication Sig Dispense Refill  . ALPRAZolam (XANAX) 0.5 MG tablet Take 1 tablet (0.5 mg total) by mouth at bedtime as needed.  30 tablet  3  . benazepril (LOTENSIN) 40 MG tablet Take 40 mg by mouth every evening.       . Cyanocobalamin (VITAMIN B-12 PO) Take 1 tablet by mouth daily.       . diltiazem (TIAZAC) 240 MG 24 hr capsule TAKE 1 CAPSULE (240 MG TOTAL) BY MOUTH DAILY.  30 capsule  3  . fenofibrate 160 MG tablet Take 160 mg by mouth daily.       . gabapentin (NEURONTIN) 300 MG capsule Take 300 mg by mouth 2 (two) times daily.       . methocarbamol (ROBAXIN) 500 MG tablet Take 500 mg by mouth at bedtime as needed.       . Multiple Vitamins-Minerals (MULTIVITAMIN,TX-MINERALS) tablet Take 1 tablet by mouth daily.       . Testosterone (ANDROGEL PUMP) 1.25 GM/ACT (1%) GEL Place onto the skin.      . DISCONTD: diltiazem (CARTIA XT) 240 MG 24 hr capsule Take 1 capsule (240 mg total) by mouth daily.  30 capsule  0  . DISCONTD: diltiazem (TIAZAC) 240 MG 24 hr capsule       . DISCONTD: pravastatin (PRAVACHOL) 20 MG tablet Take 1 tablet (20 mg total) by mouth daily.  30 tablet  6  . DISCONTD: zolpidem (AMBIEN) 10 MG tablet Take 1 tablet (10 mg total) by mouth at bedtime as needed for sleep.  30 tablet  1    No Known Allergies  Past Medical History  Diagnosis Date  . Headache     migraines x 3 in life  . Cancer     melanoma left forearm 2005- no chemo, prostate  . Hypertension   . Bleeding hemorrhoids   . Hyperlipidemia   . Hearing loss   .  Rectal bleeding   . Blood in stool   . Rectal pain   . Atrial flutter     Past Surgical History  Procedure Date  . Melanoma excision   . Cardiac catheterization     15 years ago  . Colonoscopy w/ polypectomy   . Tonsillectomy 1947  . Rotator cuff repair     right  . Arhtroscopy  right knee 2010  . Back surgery 07/2006    lumbar discectomy  . Lumbar laminectomy/decompression microdiscectomy 08/16/2011    Procedure: LUMBAR LAMINECTOMY/DECOMPRESSION MICRODISCECTOMY;  Surgeon: [name];  Location: [facility];  Service: Orthopedics;  Laterality: N/A;  Lumbar Decompression and Foraminotomy L4-S1    . Prostatectomy 07/2008    History  Smoking status  . Former Smoker -- 1.0 packs/day for 20 years  . Types: Cigarettes  . Quit date: 08/08/1984  Smokeless tobacco  . Never Used    History  Alcohol Use  . Yes    socially   2 beer or mixed drinks week    History reviewed. No pertinent family history.  Review of Systems: As noted in history of present   illness All other systems were reviewed and are negative.  Physical Exam: BP 149/80  Pulse 62  Ht 6' (1.829 m)  Wt 209 lb 12.8 oz (95.165 kg)  BMI 28.45 kg/m2 He is a pleasant, overweight white male in no acute distress.The patient is alert and oriented x 3. Pulse is 84 by my exam and irregular.The mood and affect are normal.  The skin is warm and dry.  Color is normal.  The HEENT exam reveals that the sclera are nonicteric.  The mucous membranes are moist.  The carotids are 2+ without bruits.  There is no thyromegaly.  There is no JVD.  The lungs are clear.  The chest wall is non tender.  The heart exam reveals an irregular rate with a normal S1 and S2.  There are no murmurs, gallops, or rubs.  The PMI is not displaced.   Abdominal exam reveals good bowel sounds.  There is no guarding or rebound.  There is no hepatosplenomegaly or tenderness.  There are no masses.  Exam of the legs reveal no clubbing, cyanosis, or edema.  The legs  are without rashes.  The distal pulses are intact.  Cranial nerves II - XII are intact.  Motor and sensory functions are intact.  The gait is normal.  LABORATORY DATA: Echocardiogram demonstrates mild LVH with normal ejection fraction of 60-65%. There is mild left atrial enlargement.  Assessment / Plan: 1. Atrial flutter. Persistent. Rate control has improved with the addition of diltiazem. Patient is not significantly symptomatic. He has a CHAD score of 2 based on his age and history of hypertension. He has been on Elquis 5 mg twice a day for the past month. We will proceed at this time with elective DC cardioversion. This is scheduled for next Monday. Procedure and risk were explained in detail with the patient and his wife.  2. Hypertension.  3. Hyperlipidemia.  

## 2012-05-18 NOTE — Anesthesia Preprocedure Evaluation (Addendum)
Anesthesia Evaluation  Patient identified by MRN, date of birth, ID band Patient awake    Reviewed: Allergy & Precautions, H&P , NPO status , Patient's Chart, lab work & pertinent test results, reviewed documented beta blocker date and time   Airway Mallampati: II TM Distance: >3 FB Neck ROM: full    Dental  (+) Teeth Intact and Dental Advisory Given   Pulmonary  breath sounds clear to auscultation        Cardiovascular hypertension, On Medications and Pt. on medications + dysrhythmias Atrial Fibrillation Rhythm:Irregular Rate:Normal     Neuro/Psych  Headaches,    GI/Hepatic   Endo/Other    Renal/GU      Musculoskeletal   Abdominal   Peds  Hematology   Anesthesia Other Findings   Reproductive/Obstetrics                          Anesthesia Physical Anesthesia Plan  ASA: III  Anesthesia Plan: General   Post-op Pain Management:    Induction: Intravenous  Airway Management Planned: Mask  Additional Equipment:   Intra-op Plan:   Post-operative Plan:   Informed Consent: I have reviewed the patients History and Physical, chart, labs and discussed the procedure including the risks, benefits and alternatives for the proposed anesthesia with the patient or authorized representative who has indicated his/her understanding and acceptance.   Dental Advisory Given and Dental advisory given  Plan Discussed with: CRNA, Anesthesiologist and Surgeon  Anesthesia Plan Comments:        Anesthesia Quick Evaluation

## 2012-05-18 NOTE — Preoperative (Signed)
Beta Blockers   Reason not to administer Beta Blockers:Not Applicable 

## 2012-05-18 NOTE — CV Procedure (Signed)
Electrical Cardioversion Procedure Note ASHAAD MARSELLA [id number] 1940-11-06  Procedure: Electrical Cardioversion Indications:  Atrial Flutter  Procedure Details Consent: Risks of procedure as well as the alternatives and risks of each were explained to the (patient/caregiver).  Consent for procedure obtained. Time Out: Verified patient identification, verified procedure, site/side was marked, verified correct patient position, special equipment/implants available, medications/allergies/relevent history reviewed, required imaging and test results available.  Performed  Patient placed on cardiac monitor, pulse oximetry, supplemental oxygen as necessary.  Sedation given: Short-acting barbiturates Pacer pads placed anterior and posterior chest.  Cardioverted 1 time(s).  Cardioverted at 120J.  Evaluation Findings: Post procedure EKG shows: NSR Complications: None Patient did tolerate procedure well.   Theron Arista [facility] 05/18/2012, 1:18 PM

## 2012-05-18 NOTE — Transfer of Care (Signed)
Immediate Anesthesia Transfer of Care Note  Patient: [name]  Procedure(s) Performed: Procedure(s) (LRB) with comments: CARDIOVERSION (N/A) - on eloquis on-going  Patient Location: PACU, Short Stay and Endoscopy Unit  Anesthesia Type: General  Level of Consciousness: awake, alert  and oriented  Airway & Oxygen Therapy: Patient Spontanous Breathing and Patient connected to nasal cannula oxygen  Post-op Assessment: Report given to PACU RN and Post -op Vital signs reviewed and stable  Post vital signs: Reviewed and stable  Complications: No apparent anesthesia complications

## 2012-05-18 NOTE — Interval H&P Note (Signed)
History and Physical Interval Note:  05/18/2012 1:01 PM  [name]  has presented today for surgery, with the diagnosis of aflutter  The various methods of treatment have been discussed with the patient and family. After consideration of risks, benefits and other options for treatment, the patient has consented to  Procedure(s) (LRB) with comments: CARDIOVERSION (N/A) - on eloquis on-going as a surgical intervention .  The patient's history has been reviewed, patient examined, no change in status, stable for surgery.  I have reviewed the patient's chart and labs.  Questions were answered to the patient's satisfaction.     Theron Arista [facility] 05/18/2012 1:01 PM

## 2012-05-18 NOTE — Anesthesia Postprocedure Evaluation (Signed)
  Anesthesia Post-op Note  Patient: [name]  Procedure(s) Performed: Procedure(s) (LRB) with comments: CARDIOVERSION (N/A) - on eloquis on-going  Patient Location: Endoscopy Unit  Anesthesia Type: General  Level of Consciousness: awake  Airway and Oxygen Therapy: Patient Spontanous Breathing and Patient connected to nasal cannula oxygen  Post-op Pain: none  Post-op Assessment: Post-op Vital signs reviewed  Post-op Vital Signs: Reviewed  Complications: No apparent anesthesia complications

## 2012-05-19 ENCOUNTER — Telehealth: Payer: Self-pay

## 2012-05-19 ENCOUNTER — Other Ambulatory Visit: Payer: Self-pay | Admitting: Internal Medicine

## 2012-05-19 NOTE — Telephone Encounter (Signed)
Patient called post hospital appointment scheduled with Norma Fredrickson NP 06/02/12.

## 2012-05-19 NOTE — Telephone Encounter (Signed)
Refill done.  

## 2012-05-27 ENCOUNTER — Telehealth: Payer: Self-pay

## 2012-05-27 NOTE — Telephone Encounter (Signed)
Pt states he doesn't know if you want him to still take Amlodopine since you put him on Diltiazem. Plz advise.      MW

## 2012-05-27 NOTE — Telephone Encounter (Signed)
He is  not supposed to continue with amlodipine. If he is still taking that, please stop. Recommend to monitor his BP, call me if BP is more than 140/85.

## 2012-05-27 NOTE — Telephone Encounter (Signed)
Discussed with pt

## 2012-05-27 NOTE — Telephone Encounter (Signed)
Prior authorization was approved for Eliquis.[facility] at [address] was called and notified.

## 2012-06-02 ENCOUNTER — Encounter: Payer: Self-pay | Admitting: Nurse Practitioner

## 2012-06-02 ENCOUNTER — Ambulatory Visit (INDEPENDENT_AMBULATORY_CARE_PROVIDER_SITE_OTHER): Payer: Medicare Other | Admitting: Nurse Practitioner

## 2012-06-02 VITALS — BP 142/80 | HR 104 | Ht 72.0 in | Wt 214.1 lb

## 2012-06-02 DIAGNOSIS — I4892 Unspecified atrial flutter: Secondary | ICD-10-CM

## 2012-06-02 NOTE — Progress Notes (Signed)
[name] Date of Birth: Jul 26, 1941 Medical Record #[id number]  History of Present Illness: [name] is seen back today for a post cardioversion visit. He is seen for Dr. Swaziland. He has had atrial flutter, HTN and HLD. He has a CHADs of 2 (age & HTN). No stroke, DM or CHF reported. Maintained on Eliquis. Was basically asymptomatic from the standpoint of his rhythm.   He comes in today. He is here alone. He is doing well. He was cardioverted on the 30th of September. No complications noted. He says he basically feels the same. Not lightheaded or dizzy. Energy is ok. More upset about the cost of Eliquis. Says he is not going to be able to afford. Not really interested in Coumadin. Asking about ablation but wants to know the costs. Not sure if he wants any more medicines. Says his stomach is a little upset. No bleeding reported.   Current Outpatient Prescriptions on File Prior to Visit  Medication Sig Dispense Refill  . ALPRAZolam (XANAX) 0.5 MG tablet Take 1 tablet (0.5 mg total) by mouth at bedtime as needed.  30 tablet  3  . apixaban (ELIQUIS) 5 MG TABS tablet Take 1 tablet (5 mg total) by mouth 2 (two) times daily.  60 tablet  6  . benazepril (LOTENSIN) 40 MG tablet Take 40 mg by mouth daily.       . Cyanocobalamin (VITAMIN B-12 PO) Take 1 tablet by mouth daily.       Marland Kitchen diltiazem (TIAZAC) 240 MG 24 hr capsule TAKE 1 CAPSULE (240 MG TOTAL) BY MOUTH DAILY.  30 capsule  3  . fenofibrate 160 MG tablet Take 160 mg by mouth daily.       Marland Kitchen gabapentin (NEURONTIN) 300 MG capsule Take 300 mg by mouth 2 (two) times daily.       Marland Kitchen HYDROCODONE-ACETAMINOPHEN PO Take by mouth as needed.      . methocarbamol (ROBAXIN) 500 MG tablet Take 500 mg by mouth at bedtime as needed.       . Multiple Vitamins-Minerals (MULTIVITAMIN,TX-MINERALS) tablet Take 1 tablet by mouth daily.       . Testosterone (ANDROGEL PUMP) 1.25 GM/ACT (1%) GEL Place onto the skin.      Marland Kitchen DISCONTD: diltiazem (CARTIA XT) 240 MG 24 hr  capsule Take 1 capsule (240 mg total) by mouth daily.  30 capsule  0  . DISCONTD: fenofibrate 160 MG tablet TAKE 1 TABLET BY MOUTH EVERY DAY  30 tablet  6  . DISCONTD: pravastatin (PRAVACHOL) 20 MG tablet Take 1 tablet (20 mg total) by mouth daily.  30 tablet  6    No Known Allergies  Past Medical History  Diagnosis Date  . Hypertension   . Bleeding hemorrhoids   . Hyperlipidemia   . Hearing loss   . Rectal bleeding   . Blood in stool   . Rectal pain   . Atrial flutter   . Headache     migraines x 3 in life  . Cancer     melanoma left forearm 2005- no chemo, prostate  . Chronic back pain     Past Surgical History  Procedure Date  . Melanoma excision   . Colonoscopy w/ polypectomy   . Tonsillectomy 1947  . Rotator cuff repair     right  . Arhtroscopy  right knee 2010  . Lumbar laminectomy/decompression microdiscectomy 08/16/2011    Procedure: LUMBAR LAMINECTOMY/DECOMPRESSION MICRODISCECTOMY;  Surgeon: Alvy Beal;  Location: [facility];  Service:  Orthopedics;  Laterality: N/A;  Lumbar Decompression and Foraminotomy L4-S1    . Prostatectomy 07/2008  . Back surgery 07/2006    lumbar discectomy  . Cardiac catheterization     15 years ago  . Cardioversion 05/18/2012    Procedure: CARDIOVERSION;  Surgeon: Peter M Swaziland, MD;  Location: [facility] ENDOSCOPY;  Service: Cardiovascular;  Laterality: N/A;  on eloquis on-going    History  Smoking status  . Former Smoker -- 1.0 packs/day for 20 years  . Types: Cigarettes  . Quit date: 08/08/1984  Smokeless tobacco  . Never Used    History  Alcohol Use  . 1.2 oz/week  . 2 Cans of beer per week    socially   2 beer or mixed drinks week    History reviewed. No pertinent family history.  Review of Systems: The review of systems is per the HPI.  All other systems were reviewed and are negative.  Physical Exam: BP 142/80  Pulse 104  Ht 6' (1.829 m)  Wt 214 lb 1.9 oz (97.124 kg)  BMI 29.04 kg/m2 Patient is very pleasant and  in no acute distress. Skin is warm and dry. Color is normal.  HEENT is unremarkable. Normocephalic/atraumatic. PERRL. Sclera are nonicteric. Neck is supple. No masses. No JVD. Lungs are clear. Cardiac exam shows a regular rate and rhythm. Abdomen is soft. Extremities are without edema. Gait and ROM are intact. No gross neurologic deficits noted.  LABORATORY DATA: EKG today shows recurrent atrial flutter. Has variable block. Rate is 104.   Assessment / Plan:  1. Atrial flutter - post cardioversion - remains on Eliquis. He is back in atrial flutter. He is requesting EP consult. I have made that referral. We discussed coumadin but we gave him samples of his Eliquis.   2. HTN - Blood pressure is ok. No change in his therapy.  3. HLD - on statin therapy.   We will refer to EP. Samples of Eliquis.   Patient is agreeable to this plan and will call if any problems develop in the interim.

## 2012-06-02 NOTE — Patient Instructions (Addendum)
Stay on your current medicines.   We will refer you to EP (Dr. Hillis Range or Sharrell Ku)  We will try to get you some samples of Eliquis  Call the [facility] Care office at [phone] if you have any questions, problems or concerns.

## 2012-06-03 ENCOUNTER — Telehealth: Payer: Self-pay | Admitting: Cardiology

## 2012-06-03 NOTE — Telephone Encounter (Signed)
[name] was calling back to see if prior authorization has been obtained.  I do not see any documentation that this has been requested.  Company will refax information for prior authorization.

## 2012-06-03 NOTE — Telephone Encounter (Signed)
[name] with [employer] calling re prior auth

## 2012-06-05 ENCOUNTER — Other Ambulatory Visit: Payer: Self-pay | Admitting: Internal Medicine

## 2012-06-05 NOTE — Telephone Encounter (Signed)
done

## 2012-06-05 NOTE — Telephone Encounter (Signed)
Ok to refill 

## 2012-06-05 NOTE — Telephone Encounter (Signed)
Patient called was told Eliquis was approved.Patient stated too expensive.Stated he will discuss with Dr.Taylor at his appointment 06/11/12.

## 2012-06-08 ENCOUNTER — Telehealth: Payer: Self-pay | Admitting: Cardiology

## 2012-06-08 NOTE — Telephone Encounter (Signed)
New problem:  Calling from [employer]. Fax request on 10/16. Prior authorization.

## 2012-06-11 ENCOUNTER — Ambulatory Visit (INDEPENDENT_AMBULATORY_CARE_PROVIDER_SITE_OTHER): Payer: Medicare Other | Admitting: Internal Medicine

## 2012-06-11 ENCOUNTER — Encounter: Payer: Self-pay | Admitting: Internal Medicine

## 2012-06-11 VITALS — BP 145/91 | HR 76 | Ht 72.0 in | Wt 213.4 lb

## 2012-06-11 DIAGNOSIS — I4892 Unspecified atrial flutter: Secondary | ICD-10-CM

## 2012-06-11 DIAGNOSIS — I4891 Unspecified atrial fibrillation: Secondary | ICD-10-CM

## 2012-06-11 MED ORDER — METOPROLOL TARTRATE 25 MG PO TABS: ORAL_TABLET | ORAL | Status: DC

## 2012-06-11 NOTE — Patient Instructions (Addendum)
Your physician recommends that you schedule a follow-up appointment in: 3 MONTHS WITH DR Ladona Ridgel  START METOPROLOL TART 25 MG TAKE ONE HALF TABLET TWICE DAILY

## 2012-06-18 ENCOUNTER — Encounter: Payer: Self-pay | Admitting: Internal Medicine

## 2012-06-18 NOTE — Assessment & Plan Note (Signed)
The patient has had recurrent atrial flutter since he underwent cardioversion a month ago. He is minimally if at all symptomatic. His ventricular rate is slightly elevated. I've started patient on beta blocker. I plan to see him back in several months. I did not rule out the possibility of his undergoing catheter ablation, though he is not particularly interested in this approach and he is minimally symptomatic making the benefit less impressive. For now, we'll continue a strategy of rate control.

## 2012-06-18 NOTE — Progress Notes (Signed)
HPI [name] is referred today by Dr. Swaziland for relation of atrial flutter. The patient has a history of hypertension, and was found to have atrial flutter several months ago. He has undergone cardioversion but has not been able to maintain sinus rhythm. He has had minimal amounts of symptoms from his atrial flutter. He was not sure that he was out of rhythm until an EKG demonstrated his atrial arrhythmia. The morphology of the atrial flutter suggest either a clockwise flutter or a left atrial flutter. He is never had syncope. He denies chest pain or associated shortness of breath. There is minimal if any palpitation. No Known Allergies   Current Outpatient Prescriptions  Medication Sig Dispense Refill  . ALPRAZolam (XANAX) 0.5 MG tablet TAKE 1 TABLET AT BEDTIME AS NEEDED FOR SLEEP  30 tablet  5  . apixaban (ELIQUIS) 5 MG TABS tablet Take 1 tablet (5 mg total) by mouth 2 (two) times daily.  60 tablet  6  . benazepril (LOTENSIN) 40 MG tablet Take 40 mg by mouth daily.       . Cyanocobalamin (VITAMIN B-12 PO) Take 1 tablet by mouth daily.       Marland Kitchen diltiazem (TIAZAC) 240 MG 24 hr capsule TAKE 1 CAPSULE (240 MG TOTAL) BY MOUTH DAILY.  30 capsule  3  . fenofibrate 160 MG tablet Take 160 mg by mouth daily.       Marland Kitchen gabapentin (NEURONTIN) 300 MG capsule Take 300 mg by mouth 2 (two) times daily.       Marland Kitchen HYDROCODONE-ACETAMINOPHEN PO Take by mouth as needed.      . methocarbamol (ROBAXIN) 500 MG tablet Take 500 mg by mouth at bedtime as needed.       . Multiple Vitamins-Minerals (MULTIVITAMIN,TX-MINERALS) tablet Take 1 tablet by mouth daily.       . Testosterone (ANDROGEL PUMP) 1.25 GM/ACT (1%) GEL Place onto the skin.      . metoprolol tartrate (LOPRESSOR) 25 MG tablet Take one half tablet twice daily  60 tablet  11  . DISCONTD: diltiazem (CARTIA XT) 240 MG 24 hr capsule Take 1 capsule (240 mg total) by mouth daily.  30 capsule  0  . DISCONTD: pravastatin (PRAVACHOL) 20 MG tablet Take 1 tablet (20 mg  total) by mouth daily.  30 tablet  6     Past Medical History  Diagnosis Date  . Hypertension   . Bleeding hemorrhoids   . Hyperlipidemia   . Hearing loss   . Rectal bleeding   . Blood in stool   . Rectal pain   . Atrial flutter   . Headache     migraines x 3 in life  . Cancer     melanoma left forearm 2005- no chemo, prostate  . Chronic back pain     ROS:   All systems reviewed and negative except as noted in the HPI.   Past Surgical History  Procedure Date  . Melanoma excision   . Colonoscopy w/ polypectomy   . Tonsillectomy 1947  . Rotator cuff repair     right  . Arhtroscopy  right knee 2010  . Lumbar laminectomy/decompression microdiscectomy 08/16/2011    Procedure: LUMBAR LAMINECTOMY/DECOMPRESSION MICRODISCECTOMY;  Surgeon: Alvy Beal;  Location: [facility];  Service: Orthopedics;  Laterality: N/A;  Lumbar Decompression and Foraminotomy L4-S1    . Prostatectomy 07/2008  . Back surgery 07/2006    lumbar discectomy  . Cardiac catheterization     15 years ago  . Cardioversion  05/18/2012    Procedure: CARDIOVERSION;  Surgeon: Peter M Swaziland, MD;  Location: [facility] ENDOSCOPY;  Service: Cardiovascular;  Laterality: N/A;  on eloquis on-going     No family history on file.   History   Social History  . Marital Status: Married    Spouse Name: N/A    Number of Children: 2  . Years of Education: N/A   Occupational History  . estimator for contractor    Social History Main Topics  . Smoking status: Former Smoker -- 1.0 packs/day for 20 years    Types: Cigarettes    Quit date: 08/08/1984  . Smokeless tobacco: Never Used  . Alcohol Use: 1.2 oz/week    2 Cans of beer per week     socially   2 beer or mixed drinks week  . Drug Use: No  . Sexually Active: Not on file   Other Topics Concern  . Not on file   Social History Narrative  . No narrative on file     BP 145/91  Pulse 76  Ht 6' (1.829 m)  Wt 213 lb 6.4 oz (96.798 kg)  BMI 28.94  kg/m2  Physical Exam:  Well appearing 71 year old man, NAD HEENT: Unremarkable Neck:  No JVD, no thyromegally Lungs:  Clear with no wheezes, rales, or rhonchi. HEART:  IRegular rate rhythm, no murmurs, no rubs, no clicks Abd:  soft, positive bowel sounds, no organomegally, no rebound, no guarding Ext:  2 plus pulses, no edema, no cyanosis, no clubbing Skin:  No rashes no nodules Neuro:  CN II through XII intact, motor grossly intact  EKG Atrial flutter with a controlled ventricular response  Assess/Plan:

## 2012-06-22 ENCOUNTER — Telehealth: Payer: Self-pay | Admitting: *Deleted

## 2012-06-22 NOTE — Telephone Encounter (Signed)
Called back to [employer] - they need to know if [name] received prior authorization request form.  Informed Lavina Hamman not in the office today but will return tomorrow.  Will forward information to her to reviwew

## 2012-06-22 NOTE — Telephone Encounter (Signed)
Arlys John, from [employer] called to follow up on a prior authorization request form needing to be filled out and sent to [employer].  Please call [employer] back at [phone].  Caralee Ates, CMA

## 2012-06-22 NOTE — Telephone Encounter (Signed)
According to documentation - prior authorization was obtained 10/9.

## 2012-06-23 NOTE — Telephone Encounter (Signed)
Follow-up:    Called in again to see if you received the prior authorization request for the patient's Eliquis.  Please call back.

## 2012-06-23 NOTE — Telephone Encounter (Signed)
Patient called spoke to wife was told Eliquis was approved 05/27/12.Wife stated Dr.Taylor took patient off eliquis and started him on Aspirin 81 mg 2 daily.Stated patient called this morning and will bring un used Eliquis samples back to office.

## 2012-06-25 ENCOUNTER — Ambulatory Visit (INDEPENDENT_AMBULATORY_CARE_PROVIDER_SITE_OTHER): Payer: Medicare Other

## 2012-06-25 DIAGNOSIS — Z23 Encounter for immunization: Secondary | ICD-10-CM

## 2012-07-02 ENCOUNTER — Ambulatory Visit (INDEPENDENT_AMBULATORY_CARE_PROVIDER_SITE_OTHER): Payer: Medicare Other | Admitting: Internal Medicine

## 2012-07-02 ENCOUNTER — Encounter: Payer: Self-pay | Admitting: Internal Medicine

## 2012-07-02 ENCOUNTER — Telehealth: Payer: Self-pay | Admitting: Internal Medicine

## 2012-07-02 VITALS — BP 128/80 | HR 90 | Temp 97.9°F | Wt 212.0 lb

## 2012-07-02 DIAGNOSIS — I10 Essential (primary) hypertension: Secondary | ICD-10-CM

## 2012-07-02 DIAGNOSIS — E785 Hyperlipidemia, unspecified: Secondary | ICD-10-CM

## 2012-07-02 DIAGNOSIS — I4892 Unspecified atrial flutter: Secondary | ICD-10-CM

## 2012-07-02 DIAGNOSIS — R209 Unspecified disturbances of skin sensation: Secondary | ICD-10-CM

## 2012-07-02 LAB — LIPID PANEL
HDL: 41.5 mg/dL (ref >39.00)
LDL Cholesterol: 123 mg/dL — ABNORMAL HIGH (ref 0–99)
Total CHOL/HDL Ratio: 5
VLDL: 26 mg/dL (ref 0.0–40.0)

## 2012-07-02 NOTE — Assessment & Plan Note (Signed)
Good medication compliance, labs 

## 2012-07-02 NOTE — Telephone Encounter (Signed)
lmom for patient to call me in regards to his Eliquis.  I do not see in Dr Lubertha Basque note where this was stopped.

## 2012-07-02 NOTE — Telephone Encounter (Signed)
FYIJackelyn Poling- Dr. Drue Novel  Office called to say the notes have been sent over for patient, if there are any questions give them a call back [phone]

## 2012-07-02 NOTE — Assessment & Plan Note (Addendum)
Status post cardioversion x 1, he continue on a flutter. Currently asymptomatic. Cardiology added metoprolol, good compliance and tolerance, rate 90 today. Patient is taking aspirin 325 mg  2 tablets daily, pt discontinue Eliquis, I will contact   Dr. Ladona Ridgel to be sure that is what he recommended Addendum, see phone note, ok on ASA only

## 2012-07-02 NOTE — Progress Notes (Signed)
  Subjective:    Patient ID: [name], male    DOB: [birth year], 71 y.o.   MRN: [id number]  HPI ROV Since the last time he was here, he saw cardiology, status post cardioversion, he continue w/ a-flutter,  eventually saw EP, notes reviewed, see assessment and plan. High cholesterol, good medication compliance. Hypertension, good medication compliance, BP today 128/80,  similar to ambulatory BPs.    Past Medical History  Diagnosis Date  . Hypertension   . Hyperlipidemia   . Hearing loss   . Atrial flutter 03-2012  . Headache     migraines x 3 in life  . Melanoma     melanoma left forearm 2005- no chemo  . Chronic back pain   . HYPOGONADISM, MALE 10/12/2007    Followup per urology    . PROSTATE CANCER, HX OF 10/06/2008    Status post surgery approximately 2010      Past Surgical History  Procedure Date  . Melanoma excision   . Colonoscopy w/ polypectomy   . Tonsillectomy 1947  . Rotator cuff repair     right  . Arhtroscopy  right knee 2010  . Lumbar laminectomy/decompression microdiscectomy 08/16/2011    Procedure: LUMBAR LAMINECTOMY/DECOMPRESSION MICRODISCECTOMY;  Surgeon: Alvy Beal;  Location: [facility];  Service: Orthopedics;  Laterality: N/A;  Lumbar Decompression and Foraminotomy L4-S1    . Prostatectomy 07/2008  . Back surgery 07/2006    lumbar discectomy  . Cardiac catheterization     15 years ago  . Cardioversion 05/18/2012    Procedure: CARDIOVERSION;  Surgeon: Peter M Swaziland, MD;  Location: [facility] ENDOSCOPY;  Service: Cardiovascular;  Laterality: N/A;  on eloquis on-going    Review of Systems No chest pain or shortness of breath No dizziness or faintness. Denies any rectal bleeding or blood in the urine. Has occasional abdominal pain "as usual" Rarely has lower extremity edema, and that is not a new sx.    Objective:   Physical Exam General -- alert, well-developed. No apparent distress.  Lungs -- normal respiratory effort, no intercostal retractions, no  accessory muscle use, and normal breath sounds.   Heart-- IRR, no murmur  Extremities-- no pretibial edema bilaterally  Neurologic-- alert & oriented X3 and strength normal in all extremities. Psych-- Cognition and judgment appear intact. Alert and cooperative with normal attention span and concentration.  not anxious appearing and not depressed appearing.       Assessment & Plan:

## 2012-07-02 NOTE — Assessment & Plan Note (Signed)
Controlled, recent BMP normal, no change 

## 2012-07-03 NOTE — Telephone Encounter (Signed)
Will discuss with Dr Ladona Ridgel with his [place] score of 1 and CHADS VASC socre of 2 he says Dr Alexander Mt tod him it was okay to stop Eliquis and start ASA.  He just doesn't know how much ASA.  I told him I would check with Dr Ladona Ridgel and call him back.

## 2012-07-06 NOTE — Telephone Encounter (Signed)
Dr Ladona Ridgel did stop Eliquis and will have him continue ASA 325mg  daily.  Patient aware

## 2012-07-07 ENCOUNTER — Encounter: Payer: Self-pay | Admitting: Internal Medicine

## 2012-07-27 ENCOUNTER — Other Ambulatory Visit: Payer: Self-pay | Admitting: Internal Medicine

## 2012-07-27 NOTE — Telephone Encounter (Signed)
Refill done.  

## 2012-08-04 ENCOUNTER — Other Ambulatory Visit: Payer: Self-pay | Admitting: Dermatology

## 2012-08-10 ENCOUNTER — Encounter: Payer: Self-pay | Admitting: Internal Medicine

## 2012-08-10 ENCOUNTER — Ambulatory Visit (INDEPENDENT_AMBULATORY_CARE_PROVIDER_SITE_OTHER): Payer: Medicare Other | Admitting: Internal Medicine

## 2012-08-10 VITALS — BP 130/78 | HR 90 | Temp 97.9°F | Wt 214.0 lb

## 2012-08-10 DIAGNOSIS — R252 Cramp and spasm: Secondary | ICD-10-CM

## 2012-08-10 DIAGNOSIS — R05 Cough: Secondary | ICD-10-CM

## 2012-08-10 MED ORDER — CYCLOBENZAPRINE HCL 10 MG PO TABS: 10.0000 mg | ORAL_TABLET | Freq: Every evening | ORAL | Status: DC | PRN

## 2012-08-10 MED ORDER — AMOXICILLIN 500 MG PO CAPS: 1000.0000 mg | ORAL_CAPSULE | Freq: Two times a day (BID) | ORAL | Status: DC

## 2012-08-10 NOTE — Progress Notes (Signed)
  Subjective:    Patient ID: [name], male    DOB: Aug 28, 1940, 71 y.o.   MRN: [id number]  HPI Acute visit Developed cough, chest congestion about a week ago, slightly worse in the last 2 days. Taking "Tylenol Cold" which helps. Also needs to change methocarbamol because his no covered by his insurance.  Past Medical History  Diagnosis Date  . Hypertension   . Hyperlipidemia   . Hearing loss   . Atrial flutter 03-2012  . Headache     migraines x 3 in life  . Melanoma     melanoma left forearm 2005- no chemo  . Chronic back pain   . HYPOGONADISM, MALE 10/12/2007    Followup per urology    . PROSTATE CANCER, HX OF 10/06/2008    Status post surgery approximately 2010     Past Surgical History  Procedure Date  . Melanoma excision   . Colonoscopy w/ polypectomy   . Tonsillectomy 1947  . Rotator cuff repair     right  . Arhtroscopy  right knee 2010  . Lumbar laminectomy/decompression microdiscectomy 08/16/2011    Procedure: LUMBAR LAMINECTOMY/DECOMPRESSION MICRODISCECTOMY;  Surgeon: Alvy Beal;  Location: [facility];  Service: Orthopedics;  Laterality: N/A;  Lumbar Decompression and Foraminotomy L4-S1    . Prostatectomy 07/2008  . Back surgery 07/2006    lumbar discectomy  . Cardiac catheterization     15 years ago  . Cardioversion 05/18/2012    Procedure: CARDIOVERSION;  Surgeon: Peter M Swaziland, MD;  Location: [facility] ENDOSCOPY;  Service: Cardiovascular;  Laterality: N/A;  on eloquis on-going    Review of Systems No fever or chills No shortness of breath per se except with coughing spells. No chest pain except for when he coughs Some sputum production, yellow in color. No facial pain or congestion, no nasal discharge     Objective:   Physical Exam General -- alert, well-developed .  Neck --no LADs HEENT -- TMs normal, throat w/o redness, face symmetric and not tender to palpation, nose not congested  Lungs -- normal respiratory effort, no intercostal retractions, no  accessory muscle use, and normal breath sounds.   Heart-- IRR  Psych-- Cognition and judgment appear intact. Alert and cooperative with normal attention span and concentration.  not anxious appearing and not depressed appearing.       Assessment & Plan:   Cough, likely bronchitis, see instructions

## 2012-08-10 NOTE — Assessment & Plan Note (Signed)
Leg cramps, symptoms well-controlled with methocarbamol, his insurance like him to change to Zanaflex, it may interact with verapamil. Will change to Flexeril. See prescription.

## 2012-08-10 NOTE — Patient Instructions (Addendum)
Rest, fluids , tylenol For cough, take Mucinex DM twice a day as needed  take the antibiotic as prescribed  (Amoxicillin) if not better in few days  Call if no better in few days Call anytime if the symptoms are severe

## 2012-09-03 ENCOUNTER — Other Ambulatory Visit: Payer: Self-pay | Admitting: Internal Medicine

## 2012-09-03 NOTE — Telephone Encounter (Signed)
Refill done.  

## 2012-09-07 ENCOUNTER — Telehealth: Payer: Self-pay | Admitting: *Deleted

## 2012-09-07 NOTE — Telephone Encounter (Signed)
Pt left VM that Diltiazem is now too expensive since it has moved up in tiers. Pt also request a change to flexeril to a cheaper med. Pharmacy suggest Mobic, or diclofenac.Please advise

## 2012-09-08 ENCOUNTER — Telehealth: Payer: Self-pay | Admitting: Internal Medicine

## 2012-09-08 MED ORDER — METHOCARBAMOL 750 MG PO TABS: 750.0000 mg | ORAL_TABLET | Freq: Every evening | ORAL | Status: DC | PRN

## 2012-09-08 NOTE — Telephone Encounter (Signed)
Dr Swaziland wrote this for him originally but he asked Dr Drue Novel to do this and Dr Drue Novel wanted Cardiology to handle.  I have called and spoke with [employer] [facility] and asked for a Tier exception for the patient they told me it would be at the most 72 hours before I had an answer.  I have called the patient back and let them know that I will make him aware as soon as I get the answer from [facility]

## 2012-09-08 NOTE — Telephone Encounter (Signed)
1. As far as Diltiazem., I suggest to use a generic. If that is not an option, recommend to contact cardiology, he may need to be transitioned to beta blockers. 2. We can switch Flexeril to methocarbamol 750 mg one by mouth qhs prn . If the patient is interested call #[phone] and 3 refills. Mobic or  diclofenac are not  substitutes.

## 2012-09-08 NOTE — Telephone Encounter (Signed)
Discussed with pt.  Sent methocarbamol 750mg  to pharmacy.

## 2012-09-08 NOTE — Telephone Encounter (Signed)
New problem:   The cost is $ 28.00 Diltiazem  240 mg .

## 2012-09-16 ENCOUNTER — Encounter: Payer: Self-pay | Admitting: Internal Medicine

## 2012-09-16 ENCOUNTER — Ambulatory Visit (INDEPENDENT_AMBULATORY_CARE_PROVIDER_SITE_OTHER): Payer: Medicare Other | Admitting: Internal Medicine

## 2012-09-16 ENCOUNTER — Telehealth: Payer: Self-pay | Admitting: Internal Medicine

## 2012-09-16 VITALS — BP 133/92 | HR 103 | Ht 72.0 in | Wt 213.4 lb

## 2012-09-16 DIAGNOSIS — I4892 Unspecified atrial flutter: Secondary | ICD-10-CM

## 2012-09-16 MED ORDER — DILTIAZEM HCL ER BEADS 120 MG PO CP24: 120.0000 mg | ORAL_CAPSULE | Freq: Two times a day (BID) | ORAL | Status: DC

## 2012-09-16 NOTE — Telephone Encounter (Signed)
New Problem   Pt is ready to schedule ablation appt.

## 2012-09-16 NOTE — Patient Instructions (Addendum)
Your physician recommends that you schedule a follow-up appointment in: 3 months with Dr Ladona Ridgel   Your physician has recommended that you have an ablation. Catheter ablation is a medical procedure used to treat some cardiac arrhythmias (irregular heartbeats). During catheter ablation, a long, thin, flexible tube is put into a blood vessel in your groin (upper thigh), or neck. This tube is called an ablation catheter. It is then guided to your heart through the blood vessel. Radio frequency waves destroy small areas of heart tissue where abnormal heartbeats may cause an arrhythmia to start. Please see the instruction sheet given to you today.----flutter ablation with CARTO

## 2012-09-16 NOTE — Assessment & Plan Note (Signed)
On careful questioning and reflection, the patient appears to be more symptomatic with his atrial flutter. I discussed the treatment options with the patient and the risk, goals, benefits, and expectations of catheter ablation have been discussed and he is wishing to proceed with ablation at this time. He'll need 3 weeks of anticoagulation before and after the procedure. Review of the patient's electrocardiogram suggest either a clockwise right atrial flutter or some other non-isthmus dependent flutter. We'll plan to use electro-anatomic mapping to help [place] sort this out.

## 2012-09-16 NOTE — Progress Notes (Signed)
HPI [name] returns today for followup. He is a 71-year-old man with a history of atrial flutter, status post cardioversion. I saw the patient several months ago at that time he denied any symptoms of his atrial flutter. We elected to continue anticoagulation and rate control. The patient discontinued his anticoagulation because of the high cost. He does not have palpitations. He does note that his energy level has been reduced. No syncope or chest pain. He takes his pulse regularly and has found it to be between 60 and 110 beats per minute. Allergies  Allergen Reactions  . Statins     Body aches     Current Outpatient Prescriptions  Medication Sig Dispense Refill  . ALPRAZolam (XANAX) 0.5 MG tablet TAKE 1 TABLET AT BEDTIME AS NEEDED FOR SLEEP  30 tablet  5  . aspirin 325 MG tablet Take 325 mg by mouth daily.      . benazepril (LOTENSIN) 40 MG tablet Take 40 mg by mouth daily.       . Cyanocobalamin (VITAMIN B-12 PO) Take 1 tablet by mouth daily.       . diltiazem (TIAZAC) 120 MG 24 hr capsule Take 1 capsule (120 mg total) by mouth 2 (two) times daily.  60 capsule  11  . fenofibrate 160 MG tablet Take 160 mg by mouth daily.       . gabapentin (NEURONTIN) 300 MG capsule Take 300 mg by mouth 2 (two) times daily.       . methocarbamol (ROBAXIN) 750 MG tablet Take 1 tablet (750 mg total) by mouth at bedtime as needed.  30 tablet  3  . metoprolol tartrate (LOPRESSOR) 25 MG tablet Take one half tablet twice daily  60 tablet  11  . Multiple Vitamins-Minerals (MULTIVITAMIN,TX-MINERALS) tablet Take 1 tablet by mouth daily.       . Testosterone (ANDROGEL PUMP) 1.25 GM/ACT (1%) GEL Place onto the skin.      . [DISCONTINUED] diltiazem (CARTIA XT) 240 MG 24 hr capsule Take 1 capsule (240 mg total) by mouth daily.  30 capsule  0  . [DISCONTINUED] pravastatin (PRAVACHOL) 20 MG tablet Take 1 tablet (20 mg total) by mouth daily.  30 tablet  6     Past Medical History  Diagnosis Date  . Hypertension    . Hyperlipidemia   . Hearing loss   . Atrial flutter 03-2012  . Headache     migraines x 3 in life  . Melanoma     melanoma left forearm 2005- no chemo  . Chronic back pain   . HYPOGONADISM, MALE 10/12/2007    Followup per urology    . PROSTATE CANCER, HX OF 10/06/2008    Status post surgery approximately 2010      ROS:   All systems reviewed and negative except as noted in the HPI.   Past Surgical History  Procedure Date  . Melanoma excision   . Colonoscopy w/ polypectomy   . Tonsillectomy 1947  . Rotator cuff repair     right  . Arhtroscopy  right knee 2010  . Lumbar laminectomy/decompression microdiscectomy 08/16/2011    Procedure: LUMBAR LAMINECTOMY/DECOMPRESSION MICRODISCECTOMY;  Surgeon: [name];  Location: [facility];  Service: Orthopedics;  Laterality: N/A;  Lumbar Decompression and Foraminotomy L4-S1    . Prostatectomy 07/2008  . Back surgery 07/2006    lumbar discectomy  . Cardiac catheterization     15 years ago  . Cardioversion 05/18/2012    Procedure: CARDIOVERSION;  Surgeon: Martin   M Jordan, MD;  Location: [facility];  Service: Cardiovascular;  Laterality: N/A;  on eloquis on-going     No family history on file.   History   Social History  . Marital Status: Married    Spouse Name: N/A    Number of Children: 2  . Years of Education: N/A   Occupational History  . estimator for contractor    Social History Main Topics  . Smoking status: Former Smoker -- 1.0 packs/day for 20 years    Types: Cigarettes    Quit date: 08/08/1984  . Smokeless tobacco: Never Used  . Alcohol Use: 1.2 oz/week    2 Cans of beer per week     Comment: socially   2 beer or mixed drinks week  . Drug Use: No  . Sexually Active: Not on file   Other Topics Concern  . Not on file   Social History Narrative  . No narrative on file     BP 133/92  Pulse 103  Ht 6' (1.829 m)  Wt 213 lb 6.4 oz (96.798 kg)  BMI 28.94 kg/m2  Physical Exam:  Well appearing  71-year-old man, NAD HEENT: Unremarkable Neck:  7 cm JVD, no thyromegally Lungs:  Clear with no wheezes, rales, or rhonchi. HEART:  IRegular rate rhythm, no murmurs, no rubs, no clicks Abd:  soft, positive bowel sounds, no organomegally, no rebound, no guarding Ext:  2 plus pulses, no edema, no cyanosis, no clubbing Skin:  No rashes no nodules Neuro:  CN II through XII intact, motor grossly intact  EKG Atrial flutter with rapid ventricular response Assess/Plan:  

## 2012-09-17 ENCOUNTER — Other Ambulatory Visit: Payer: Self-pay | Admitting: *Deleted

## 2012-09-17 ENCOUNTER — Encounter: Payer: Self-pay | Admitting: *Deleted

## 2012-09-17 MED ORDER — DILTIAZEM HCL ER COATED BEADS 120 MG PO TB24: 120.0000 mg | ORAL_TABLET | Freq: Two times a day (BID) | ORAL | Status: DC

## 2012-09-17 MED ORDER — RIVAROXABAN 20 MG PO TABS: 20.0000 mg | ORAL_TABLET | Freq: Every day | ORAL | Status: DC

## 2012-09-17 NOTE — Telephone Encounter (Signed)
Pt agrees to the 27th I will schedule and call him back with details and mail him instructions, pt understands this. Per Dr Ladona Ridgel we will start Xarelto 20 mg daily for at least 3 weeks before and 3 weeks after his procedure.

## 2012-09-21 NOTE — Progress Notes (Unsigned)
Adendum- Something wrong with EPIC account. Adding telephone note through here   New Problem     Pt wasn't able to get medications for ablation, must be pre-qualified. Pt also states prescription for diltiazem was supposed to be rewritten and says it still hasnt. Would like to speak to nurse.

## 2012-09-22 ENCOUNTER — Telehealth: Payer: Self-pay | Admitting: Internal Medicine

## 2012-09-22 NOTE — Telephone Encounter (Signed)
Called patient back  He must need a PA on his Xarelto, I have lmom for him to return my call

## 2012-09-22 NOTE — Telephone Encounter (Signed)
New problem:   Patient calling back to speak with nurse . Regarding Rx that he has to have before his operation. This has to be pre-approve.

## 2012-09-22 NOTE — Telephone Encounter (Addendum)
Follow Up     Returning [facility] phone call from earlier.

## 2012-09-22 NOTE — Telephone Encounter (Signed)
I have asked patient to come pick up samples of Xarelto 20mg  to take for 3 weeks prior to procedure and 3 weeks after procedure He will pick up today  Insurance will not approve due to dx of atrial flutter and not afib

## 2012-10-06 ENCOUNTER — Encounter (HOSPITAL_COMMUNITY): Payer: Self-pay | Admitting: Pharmacy Technician

## 2012-10-09 ENCOUNTER — Other Ambulatory Visit (INDEPENDENT_AMBULATORY_CARE_PROVIDER_SITE_OTHER): Payer: Medicare Other

## 2012-10-09 DIAGNOSIS — I4892 Unspecified atrial flutter: Secondary | ICD-10-CM

## 2012-10-09 LAB — CBC WITH DIFFERENTIAL/PLATELET
Basophils Absolute: 0 K/uL (ref 0.0–0.1)
Basophils Relative: 0.6 % (ref 0.0–3.0)
Eosinophils Absolute: 0.2 K/uL (ref 0.0–0.7)
Eosinophils Relative: 2.5 % (ref 0.0–5.0)
HCT: 49.6 % (ref 39.0–52.0)
Hemoglobin: 17 g/dL (ref 13.0–17.0)
Lymphocytes Relative: 23.9 % (ref 12.0–46.0)
Lymphs Abs: 1.6 K/uL (ref 0.7–4.0)
MCHC: 34.2 g/dL (ref 30.0–36.0)
MCV: 89.1 fl (ref 78.0–100.0)
Monocytes Absolute: 0.5 K/uL (ref 0.1–1.0)
Monocytes Relative: 7.9 % (ref 3.0–12.0)
Neutro Abs: 4.3 K/uL (ref 1.4–7.7)
Neutrophils Relative %: 65.1 % (ref 43.0–77.0)
Platelets: 212 K/uL (ref 150.0–400.0)
RBC: 5.57 Mil/uL (ref 4.22–5.81)
RDW: 14.1 % (ref 11.5–14.6)
WBC: 6.5 K/uL (ref 4.5–10.5)

## 2012-10-09 LAB — BASIC METABOLIC PANEL WITH GFR
BUN: 25 mg/dL — ABNORMAL HIGH (ref 6–23)
CO2: 27 meq/L (ref 19–32)
Calcium: 9 mg/dL (ref 8.4–10.5)
Chloride: 106 meq/L (ref 96–112)
Creatinine, Ser: 1.4 mg/dL (ref 0.4–1.5)
GFR: 51.72 mL/min — ABNORMAL LOW
Glucose, Bld: 118 mg/dL — ABNORMAL HIGH (ref 70–99)
Potassium: 3.8 meq/L (ref 3.5–5.1)
Sodium: 139 meq/L (ref 135–145)

## 2012-10-12 ENCOUNTER — Telehealth: Payer: Self-pay | Admitting: Internal Medicine

## 2012-10-12 NOTE — Telephone Encounter (Signed)
Called and spoke with patient he was questioning if DrTaylor was going to check for blocked arteries while he was doing the ablation.  I explained to him that is two different procedures.  During an ablation your are dealing with electrical current and that when looking for blockages you are that has to do with the "plumbing of the heart.  He was just wanting a "2 for 1 speacial"

## 2012-10-12 NOTE — Telephone Encounter (Signed)
New problem   Pt having surgery 10/15/12 and have if you will check to see if he has any blockages while during the ablations.

## 2012-10-15 ENCOUNTER — Encounter (HOSPITAL_COMMUNITY)
Admission: RE | Disposition: A | Discharge status: Home / Self Care | Payer: Self-pay | Source: Ambulatory Visit | Attending: Internal Medicine

## 2012-10-15 ENCOUNTER — Encounter (HOSPITAL_COMMUNITY): Payer: Self-pay | Admitting: General Practice

## 2012-10-15 ENCOUNTER — Telehealth: Payer: Self-pay | Admitting: Internal Medicine

## 2012-10-15 ENCOUNTER — Ambulatory Visit (HOSPITAL_COMMUNITY)
Admission: RE | Admit: 2012-10-15 | Discharge: 2012-10-15 | Disposition: A | Discharge status: Home / Self Care | Payer: Medicare Other | Source: Ambulatory Visit | Attending: Internal Medicine | Admitting: Internal Medicine

## 2012-10-15 DIAGNOSIS — I4892 Unspecified atrial flutter: Secondary | ICD-10-CM

## 2012-10-15 DIAGNOSIS — Z8546 Personal history of malignant neoplasm of prostate: Secondary | ICD-10-CM | POA: Insufficient documentation

## 2012-10-15 DIAGNOSIS — I10 Essential (primary) hypertension: Secondary | ICD-10-CM | POA: Insufficient documentation

## 2012-10-15 DIAGNOSIS — E785 Hyperlipidemia, unspecified: Secondary | ICD-10-CM | POA: Insufficient documentation

## 2012-10-15 DIAGNOSIS — I4891 Unspecified atrial fibrillation: Secondary | ICD-10-CM | POA: Diagnosis present

## 2012-10-15 HISTORY — PX: ABLATION OF DYSRHYTHMIC FOCUS: SHX254

## 2012-10-15 HISTORY — PX: ATRIAL FLUTTER ABLATION: SHX5733

## 2012-10-15 SURGERY — ATRIAL FLUTTER ABLATION
Anesthesia: LOCAL

## 2012-10-15 MED ORDER — ONDANSETRON HCL 4 MG/2ML IJ SOLN: 4.0000 mg | Freq: Four times a day (QID) | INTRAMUSCULAR | Status: DC | PRN

## 2012-10-15 MED ORDER — BENAZEPRIL HCL 40 MG PO TABS
40.0000 mg | ORAL_TABLET | Freq: Every morning | ORAL | Status: DC
  Administered 2012-10-15: 40 mg via ORAL
  Filled 2012-10-15: qty 1

## 2012-10-15 MED ORDER — BUPIVACAINE HCL (PF) 0.25 % IJ SOLN
INTRAMUSCULAR | Status: AC
  Filled 2012-10-15: qty 60

## 2012-10-15 MED ORDER — MIDAZOLAM HCL 5 MG/5ML IJ SOLN
INTRAMUSCULAR | Status: AC
  Filled 2012-10-15: qty 5

## 2012-10-15 MED ORDER — SODIUM CHLORIDE 0.9 % IV SOLN: 250.0000 mL | INTRAVENOUS | Status: DC | PRN

## 2012-10-15 MED ORDER — FENTANYL CITRATE 0.05 MG/ML IJ SOLN
INTRAMUSCULAR | Status: AC
  Filled 2012-10-15: qty 2

## 2012-10-15 MED ORDER — SODIUM CHLORIDE 0.9 % IJ SOLN: 3.0000 mL | Freq: Two times a day (BID) | INTRAMUSCULAR | Status: DC

## 2012-10-15 MED ORDER — HEPARIN (PORCINE) IN NACL 2-0.9 UNIT/ML-% IJ SOLN
INTRAMUSCULAR | Status: AC
  Filled 2012-10-15: qty 500

## 2012-10-15 MED ORDER — METOPROLOL TARTRATE 12.5 MG HALF TABLET
12.5000 mg | ORAL_TABLET | Freq: Two times a day (BID) | ORAL | Status: DC
  Administered 2012-10-15: 12.5 mg via ORAL
  Filled 2012-10-15 (×2): qty 1

## 2012-10-15 MED ORDER — RIVAROXABAN 20 MG PO TABS
20.0000 mg | ORAL_TABLET | ORAL | Status: DC
  Filled 2012-10-15: qty 1

## 2012-10-15 MED ORDER — SODIUM CHLORIDE 0.9 % IJ SOLN: 3.0000 mL | INTRAMUSCULAR | Status: DC | PRN

## 2012-10-15 MED ORDER — ACETAMINOPHEN 325 MG PO TABS: 650.0000 mg | ORAL_TABLET | ORAL | Status: DC | PRN

## 2012-10-15 NOTE — Op Note (Signed)
EPS/RFA of atrial flutter without immediate complication. Q#[id number].

## 2012-10-15 NOTE — Interval H&P Note (Signed)
History and Physical Interval Note:  10/15/2012 7:22 AM  [name]  has presented today for surgery, with the diagnosis of Aflutter  The various methods of treatment have been discussed with the patient and family. After consideration of risks, benefits and other options for treatment, the patient has consented to  Procedure(s): ATRIAL FLUTTER ABLATION (N/A) as a surgical intervention .  The patient's history has been reviewed, patient examined, no change in status, stable for surgery.  I have reviewed the patient's chart and labs.  Questions were answered to the patient's satisfaction.     Lewayne Bunting

## 2012-10-15 NOTE — Discharge Summary (Signed)
Patient ID: [name],  MRN: [id number], DOB/AGE: 72-17-1942 71 y.o.  Admit date: 10/15/2012 Discharge date: 10/15/2012  Primary Care Provider: Willow Ora Primary Cardiologist: G. Ladona Ridgel, MD  Discharge Diagnoses Principal Problem:   Atrial flutter Active Problems:   HYPERLIPIDEMIA   HYPERTENSION  Allergies Allergies  Allergen Reactions  . Statins Other (See Comments)    Body aches   Procedures  EP study and radiofrequency ablation for atrial flutter  History of Present Illness  72 y/o male with h/o atrial flutter s/p cardioversion.  He was recently seen in the office by Dr. Ladona Ridgel with complaints of fatigue and was found to be back in atrial flutter.  Decision was made to pursue ablation and he was placed back on oral anticoagulation.  Hospital Course  Pt presented to the [facility] EP lab on 2/27 and underwent EP study followed by successful radiofrequency catheter ablation for atrial flutter.  He tolerated procedure well and post-procedure has been ambulating without recurrent symptoms or limitations.  He will be discharged home today in good condition.  Discharge Vitals Blood pressure 149/101, pulse 110, temperature 97.7 F (36.5 C), temperature source Oral, resp. rate 18, height 6' (1.829 m), weight 205 lb (92.987 kg), SpO2 98.00%.  Filed Weights   10/15/12 0600  Weight: 205 lb (92.987 kg)    Labs  None  Disposition  Pt is being discharged home today in good condition.  Follow-up Plans & Appointments  Follow-up Information   Follow up with Lewayne Bunting, MD On 12/09/2012. (9:15 AM)    Contact information:   [address] [place] [address] [phone]       Follow up with Willow Ora, MD On 12/30/2012. (9:30)    Contact information:   [phone] [place] [address] [phone]      Discharge Medications    Medication List    TAKE these medications       ALPRAZolam 0.5 MG tablet  Commonly known as:  XANAX  Take 0.5 mg  by mouth at bedtime as needed for sleep.     aspirin 325 MG tablet  Take 325 mg by mouth 2 (two) times daily.     benazepril 40 MG tablet  Commonly known as:  LOTENSIN  Take 40 mg by mouth every morning.     diltiazem 240 MG 24 hr capsule  Commonly known as:  DILACOR XR  Take 240 mg by mouth every morning.     fenofibrate 160 MG tablet  Take 160 mg by mouth every morning.     FISH OIL + D3 1200-1000 MG-UNIT Caps  Take 1 capsule by mouth every morning.     gabapentin 300 MG capsule  Commonly known as:  NEURONTIN  Take 300 mg by mouth 2 (two) times daily.     methocarbamol 750 MG tablet  Commonly known as:  ROBAXIN  Take 750 mg by mouth at bedtime.     metoprolol tartrate 25 MG tablet  Commonly known as:  LOPRESSOR  Take 12.5 mg by mouth 2 (two) times daily.     multivitamin with minerals Tabs  Take 1 tablet by mouth daily.     RASPBERRY KETONES PO  Take 1 tablet by mouth every morning.     Testosterone 12.5 MG/ACT (1%) Gel  Place 1 application onto the skin daily.     VITAMIN B-12 PO  Take 1 tablet by mouth daily.     vitamin B-12 1000 MCG tablet  Commonly known  as:  CYANOCOBALAMIN  Take 1,000 mcg by mouth daily.     XARELTO 20 MG Tabs  Generic drug:  Rivaroxaban  Take 20 mg by mouth daily with supper.      Outstanding Labs/Studies  None  Duration of Discharge Encounter   Greater than 30 minutes including physician time.  Signed, Nicolasa Ducking NP 10/15/2012, 5:06 PM

## 2012-10-15 NOTE — Op Note (Signed)
[name], [name] NO.:  [id number]  MEDICAL RECORD NO.:  [id number]  LOCATION:  [id number]                        FACILITY:  [facility]  PHYSICIAN:  Doylene Canning. Ladona Ridgel, MD    DATE OF BIRTH:  12-16-1940  DATE OF PROCEDURE:  10/15/2012 DATE OF DISCHARGE:  10/15/2012                              OPERATIVE REPORT   PROCEDURE PERFORMED:  Electrophysiologic study and radiofrequency catheter ablation of atrial flutter.  INTRODUCTION:  The patient is a 72 year old man with a history of atypical atrial flutter, symptomatic, refractory to medical therapy.  He has been anticoagulated and is now referred for catheter ablation.  PROCEDURE:  After informed consent was obtained, the patient was taken to the diagnostic EP lab in a fasting state.  After usual preparation and draping, intravenous fentanyl and midazolam were given for sedation. A 6-French hexapolar catheter was inserted percutaneously in a right jugular vein and advanced to the coronary sinus.  A 6-French quadripolar catheter was inserted percutaneously in a right femoral vein and advanced to His bundle region.  A 7-French quadripolar 3D electro anatomic mapping catheter was advanced percutaneously in a right femoral vein and advanced to a right atrium.  The baseline rhythm was atrial flutter, cycle length 228 milliseconds.  3D electro anatomic mapping was then carried out.  The patient was found to have clockwise atrial flutter, with the activation sequence traveling in a clockwise direction around the tricuspid valve annulus.  A right atrial shell was then created utilizing 3D electro anatomic mapping.  The ablation catheter was then maneuvered into the atrial flutter isthmus.  Four RF energy applications were subsequently delivered to the atrial flutter isthmus. During the 2nd RF energy application, atrial flutter was terminated and sinus rhythm was restored.  A 3rd RF energy application resulted in atrial flutter  isthmus block.  4th Bonus RF energy application was then delivered and the patient was observed for 30 minutes.  During this time, rapid atrial pacing was carried out from the right ventricle demonstrating VA Wenckebach cycle length of above 420 milliseconds. Programmed ventricular stimulation was carried out from the right ventricle demonstrating a retrograde AV node ERP of 500/420.  During ventricular pacing, the atrial activation was midline and decremental. Programmed atrial stimulation was carried out from the atrium at a base drive cycle length of 161 milliseconds. The pacing was carried out down at 300 milliseconds where AV Wenckebach was observed.  During rapid atrial pacing, the PR interval was less than RR interval.  There was no inducible SVT.  Following programmed, atrial stimulation was carried out at a base drive cycle length of 096 milliseconds stepwise decreased down to 320 milliseconds where the AV node ERP was observed.  During programmed atrial stimulation, there were no AH jumps, no echo beats, and no inducible SVT.  At this point, the catheters were removed. Hemostasis was assured and the patient was returned to his room in satisfactory condition.  COMPLICATIONS:  There were no immediate procedure complications.  RESULTS:  A.  Baseline ECG.  Baseline ECG demonstrates atypical atrial flutter cycle length of 230 milliseconds with a ventricular rate of 110 beats per minute. B.  Baseline intervals.  Atrial flutter cycle length was 220 milliseconds.  The QRS duration 100 milliseconds, the HV interval was 34 milliseconds. C.  Rapid ventricular pacing.  Following ablation, rapid ventricular pacing was carried out from the right ventricle demonstrating a VA Wenckebach cycle length of 420 milliseconds.  During rapid ventricular pacing, the atrial activation was midline and decremental. D.  Programmed ventricular stimulation.  Programmed ventricular stimulation was carried  out the right ventricle at a base drive cycle length of 914 milliseconds.  The S1-S2 interval stepwise decreased down to 420 milliseconds with retrograde AV node ERP was observed.  During programmed ventricular stimulation, the atrial activation was midline and decremental. E.  Programmed atrial stimulation.  Programmed atrial stimulation was carried out from the atrium at a base drive cycle of 782 milliseconds. The S1-S2 interval stepwise decreased down to 320 milliseconds where the AV node ERP was observed.  During programmed atrial stimulation, there were no AH jumps, no echo beats, no inducible SVT. F.  Rapid atrial pacing.  Rapid atrial pacing was carried out from the atrium at a base drive cycle length of 956 milliseconds and stepwise decreased down to 390 milliseconds where AV Wenckebach was observed. During rapid atrial pacing, the PR interval was less than the RR interval and there was no inducible SVT. G.  Arrhythmias observed. 1. Atrial flutter initiation present at the time of EP study.  The     duration was sustained, cycle length was 220 milliseconds.  Method     of termination was catheter ablation.     a.     Mapping.  Mapping of atrial flutter isthmus demonstrated      usual size and orientation.  This patient's atrial flutter was      actually clockwise around the tricuspid valve annulus.     b.     RF energy application.  A total of 4 energy applications      were delivered to the atrial flutter isthmus resulting in      termination of atrial flutter and restoration of sinus rhythm.  CONCLUSION:  This study demonstrates successful electrophysiologic study and RF catheter ablation of clockwise tricuspid annular reentrant atrial flutter.  Catheter ablation resulted in restoration of sinus rhythm.     Doylene Canning. Ladona Ridgel, MD     GWT/MEDQ  D:  10/15/2012  T:  10/15/2012  Job:  [id number]  cc:   Peter M. [place]il[place], M.D.

## 2012-10-15 NOTE — Telephone Encounter (Signed)
New Problem:    Patient's wife called in needing 3 weeks worth of samples of Rivaroxaban (XARELTO) 20 MG TABS for her husband following his procedure.  Please call back.

## 2012-10-15 NOTE — Progress Notes (Signed)
Reviewed discharge instructions with patient and wife, they stated their understanding.  Patient discharged via wheelchair home with wife, after ambulating in the hallway.  Groin level 0 no bruising or hematoma.  Colman Cater

## 2012-10-15 NOTE — H&P (View-Only) (Signed)
HPI [name] returns today for followup. He is a 72 year old man with a history of atrial flutter, status post cardioversion. I saw the patient several months ago at that time he denied any symptoms of his atrial flutter. We elected to continue anticoagulation and rate control. The patient discontinued his anticoagulation because of the high cost. He does not have palpitations. He does note that his energy level has been reduced. No syncope or chest pain. He takes his pulse regularly and has found it to be between 60 and 110 beats per minute. Allergies  Allergen Reactions  . Statins     Body aches     Current Outpatient Prescriptions  Medication Sig Dispense Refill  . ALPRAZolam (XANAX) 0.5 MG tablet TAKE 1 TABLET AT BEDTIME AS NEEDED FOR SLEEP  30 tablet  5  . aspirin 325 MG tablet Take 325 mg by mouth daily.      . benazepril (LOTENSIN) 40 MG tablet Take 40 mg by mouth daily.       . Cyanocobalamin (VITAMIN B-12 PO) Take 1 tablet by mouth daily.       Marland Kitchen diltiazem (TIAZAC) 120 MG 24 hr capsule Take 1 capsule (120 mg total) by mouth 2 (two) times daily.  60 capsule  11  . fenofibrate 160 MG tablet Take 160 mg by mouth daily.       Marland Kitchen gabapentin (NEURONTIN) 300 MG capsule Take 300 mg by mouth 2 (two) times daily.       . methocarbamol (ROBAXIN) 750 MG tablet Take 1 tablet (750 mg total) by mouth at bedtime as needed.  30 tablet  3  . metoprolol tartrate (LOPRESSOR) 25 MG tablet Take one half tablet twice daily  60 tablet  11  . Multiple Vitamins-Minerals (MULTIVITAMIN,TX-MINERALS) tablet Take 1 tablet by mouth daily.       . Testosterone (ANDROGEL PUMP) 1.25 GM/ACT (1%) GEL Place onto the skin.      . [DISCONTINUED] diltiazem (CARTIA XT) 240 MG 24 hr capsule Take 1 capsule (240 mg total) by mouth daily.  30 capsule  0  . [DISCONTINUED] pravastatin (PRAVACHOL) 20 MG tablet Take 1 tablet (20 mg total) by mouth daily.  30 tablet  6     Past Medical History  Diagnosis Date  . Hypertension    . Hyperlipidemia   . Hearing loss   . Atrial flutter 03-2012  . Headache     migraines x 3 in life  . Melanoma     melanoma left forearm 2005- no chemo  . Chronic back pain   . HYPOGONADISM, MALE 10/12/2007    Followup per urology    . PROSTATE CANCER, HX OF 10/06/2008    Status post surgery approximately 2010      ROS:   All systems reviewed and negative except as noted in the HPI.   Past Surgical History  Procedure Date  . Melanoma excision   . Colonoscopy w/ polypectomy   . Tonsillectomy 1947  . Rotator cuff repair     right  . Arhtroscopy  right knee 2010  . Lumbar laminectomy/decompression microdiscectomy 08/16/2011    Procedure: LUMBAR LAMINECTOMY/DECOMPRESSION MICRODISCECTOMY;  Surgeon: Alvy Beal;  Location: [facility];  Service: Orthopedics;  Laterality: N/A;  Lumbar Decompression and Foraminotomy L4-S1    . Prostatectomy 07/2008  . Back surgery 07/2006    lumbar discectomy  . Cardiac catheterization     15 years ago  . Cardioversion 05/18/2012    Procedure: CARDIOVERSION;  Surgeon: Theron Arista  M Swazil[place], MD;  Location: [facility] END[facility];  Service: Cardiovascular;  Laterality: N/A;  on eloquis on-going     No family history on file.   History   Social History  . Marital Status: Married    Spouse Name: N/A    Number of Children: 2  . Years of Education: N/A   Occupational History  . estimator for contractor    Social History Main Topics  . Smoking status: Former Smoker -- 1.0 packs/day for 20 years    Types: Cigarettes    Quit date: 08/08/1984  . Smokeless tobacco: Never Used  . Alcohol Use: 1.2 oz/week    2 Cans of beer per week     Comment: socially   2 beer or mixed drinks week  . Drug Use: No  . Sexually Active: Not on file   Other Topics Concern  . Not on file   Social History Narrative  . No narrative on file     BP 133/92  Pulse 103  Ht 6' (1.829 m)  Wt 213 lb 6.4 oz (96.798 kg)  BMI 28.94 kg/m2  Physical Exam:  Well appearing  73 year old man, NAD HEENT: Unremarkable Neck:  7 cm JVD, no thyromegally Lungs:  Clear with no wheezes, rales, or rhonchi. HEART:  IRegular rate rhythm, no murmurs, no rubs, no clicks Abd:  soft, positive bowel sounds, no organomegally, no rebound, no guarding Ext:  2 plus pulses, no edema, no cyanosis, no clubbing Skin:  No rashes no nodules Neuro:  CN II through XII intact, motor grossly intact  EKG Atrial flutter with rapid ventricular response Assess/Plan:

## 2012-10-16 NOTE — Telephone Encounter (Signed)
Spoke with patient will leave him some samples of Xarelto out front for the next 3 weeks  Let him know to take it easy for the next week and no lifting for 10days over 10 pounds

## 2012-10-16 NOTE — Telephone Encounter (Signed)
New Prob    Pt has questions about Xarelto and directions about what he can/can not do after his ablation procedure. Would like to speak to nurse.

## 2012-11-09 ENCOUNTER — Telehealth: Payer: Self-pay | Admitting: Internal Medicine

## 2012-11-09 NOTE — Telephone Encounter (Signed)
Plz return call to patient to discuss cardiac clearance and dental appointment. He can be reached at [phone]

## 2012-11-09 NOTE — Telephone Encounter (Signed)
Tried to call twice and was fax machine, will forward to [employer]

## 2012-11-10 ENCOUNTER — Encounter: Payer: Self-pay | Admitting: Cardiology

## 2012-11-10 NOTE — Telephone Encounter (Signed)
Spoke with patient and he cancelled his cleaning due to his procedure, told him it is fine to reschedule procedure for cleaning. He will be off his Xarelto in 3 days from now.

## 2012-11-25 ENCOUNTER — Telehealth: Payer: Self-pay | Admitting: *Deleted

## 2012-11-25 MED ORDER — CYCLOBENZAPRINE HCL 10 MG PO TABS: 10.0000 mg | ORAL_TABLET | Freq: Every evening | ORAL | Status: DC | PRN

## 2012-11-25 NOTE — Telephone Encounter (Signed)
Called into pharmacy

## 2012-11-25 NOTE — Telephone Encounter (Signed)
[facility] pharmacy called stating that the pt's insurance will no longer cover methocarbamol 750mg . Alternative meds are celebrex & cyclobenzaprine. Please advise.

## 2012-11-25 NOTE — Telephone Encounter (Signed)
If patient is agreeable, we can change from methocarbamol to Flexeril 10 mg one by mouth qhs prn.   #30 and 3 refills. (It  does not interacts with Xarelto)

## 2012-12-04 ENCOUNTER — Other Ambulatory Visit: Payer: Self-pay | Admitting: Internal Medicine

## 2012-12-04 NOTE — Telephone Encounter (Signed)
Refill done.  

## 2012-12-04 NOTE — Telephone Encounter (Signed)
#   30, NR  

## 2012-12-04 NOTE — Telephone Encounter (Signed)
Ok to refill? Last OV 12.23.13

## 2012-12-09 ENCOUNTER — Other Ambulatory Visit: Payer: Self-pay | Admitting: Internal Medicine

## 2012-12-09 ENCOUNTER — Ambulatory Visit (INDEPENDENT_AMBULATORY_CARE_PROVIDER_SITE_OTHER): Payer: Medicare Other | Admitting: Internal Medicine

## 2012-12-09 ENCOUNTER — Encounter: Payer: Self-pay | Admitting: Internal Medicine

## 2012-12-09 VITALS — BP 148/78 | HR 55 | Ht 72.0 in | Wt 210.8 lb

## 2012-12-09 DIAGNOSIS — I4892 Unspecified atrial flutter: Secondary | ICD-10-CM

## 2012-12-09 DIAGNOSIS — I10 Essential (primary) hypertension: Secondary | ICD-10-CM

## 2012-12-09 NOTE — Assessment & Plan Note (Signed)
His blood pressure is slightly elevated today. We discussed options for blood pressure control, and I've recommended that he followup with his primary physician, and maintain a low-sodium diet.

## 2012-12-09 NOTE — Patient Instructions (Signed)
Your physician wants you to follow-up in: 12 months with Dr. Taylor. You will receive a reminder letter in the mail two months in advance. If you don't receive a letter, please call our office to schedule the follow-up appointment.    

## 2012-12-09 NOTE — Assessment & Plan Note (Signed)
He is maintaining sinus rhythm very nicely. He'll continue his beta blocker therapy. I've asked the patient to reduce his aspirin dose to 1 tablet daily.

## 2012-12-09 NOTE — Progress Notes (Signed)
HPI Mr. Spangler returns today for followup. He is a 72 year old man with atrial flutter, who underwent catheter ablation 2 months ago. 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 In the interim, he has done well. He denies chest pain, shortness of breath, or palpitations. No syncope. Allergies  Allergen Reactions  . Statins Other (See Comments)    Body aches     Current Outpatient Prescriptions  Medication Sig Dispense Refill  . ALPRAZolam (XANAX) 0.5 MG tablet TAKE 1 TABLET BY MOUTH AT BEDTIME AS NEEDED FOR SLEEP  30 tablet  0  . aspirin 325 MG tablet Take 325 mg by mouth 2 (two) times daily.      . benazepril (LOTENSIN) 40 MG tablet Take 40 mg by mouth every morning.      . cyclobenzaprine (FLEXERIL) 10 MG tablet Take 1 tablet (10 mg total) by mouth at bedtime as needed for muscle spasms.  30 tablet  3  . diltiazem (DILACOR XR) 240 MG 24 hr capsule Take 240 mg by mouth every morning.      . fenofibrate 160 MG tablet Take 160 mg by mouth every morning.      . Fish Oil-Cholecalciferol (FISH OIL + D3) 1200-1000 MG-UNIT CAPS Take 1 capsule by mouth every morning.      . gabapentin (NEURONTIN) 300 MG capsule Take 300 mg by mouth 2 (two) times daily.      . metoprolol tartrate (LOPRESSOR) 25 MG tablet Take 12.5 mg by mouth 2 (two) times daily.      . Multiple Vitamin (MULTIVITAMIN WITH MINERALS) TABS Take 1 tablet by mouth daily.      Marland Kitchen RASPBERRY KETONES PO Take 1 tablet by mouth every morning.      . Rivaroxaban (XARELTO) 20 MG TABS Take 20 mg by mouth daily with supper.      . Testosterone 12.5 MG/ACT (1%) GEL Place 1 application onto the skin daily.      . vitamin B-12 (CYANOCOBALAMIN) 1000 MCG tablet Take 1,000 mcg by mouth daily.      . [DISCONTINUED] pravastatin (PRAVACHOL) 20 MG tablet Take 1 tablet (20 mg total) by mouth daily.  30 tablet  6   No current facility-administered medications for this visit.      Past Medical History  Diagnosis Date  . Hypertension   . Hyperlipidemia   . Hearing loss   . Atrial flutter 03-2012  . Headache     migraines x 3 in life  . Melanoma     melanoma left forearm 2005- no chemo  . Chronic back pain   . HYPOGONADISM, MALE 10/12/2007    Followup per urology    . PROSTATE CANCER, HX OF 10/06/2008    Status post surgery approximately 2010      ROS:   All systems reviewed and negative except as noted in the HPI.   Past Surgical History  Procedure Laterality Date  . Melanoma excision    . Colonoscopy w/ polypectomy    . Tonsillectomy  1947  . Rotator cuff repair      right  . Arhtroscopy  right knee  2010  . Lumbar laminectomy/decompression microdiscectomy  08/16/2011    Procedure: LUMBAR LAMINECTOMY/DECOMPRESSION MICRODISCECTOMY;  Surgeon: Alvy Beal;  Location: [facility];  Service: Orthopedics;  Laterality: N/A;  Lumbar Decompression and Foraminotomy L4-S1    . Prostatectomy  07/2008  . Back surgery  07/2006    lumbar discectomy  . Cardiac  catheterization      15 years ago  . Cardioversion  05/18/2012    Procedure: CARDIOVERSION;  Surgeon: Peter M Swaziland, MD;  Location: [facility]COPY;  Service: Cardiovascular;  Laterality: N/A;  on eloquis on-going  . Ablation of dysrhythmic focus  10/15/2012    Dr Ladona Ridgel     History reviewed. No pertinent family history.   History   Social History  . Marital Status: Married    Spouse Name: N/A    Number of Children: 2  . Years of Education: N/A   Occupational History  . estimator for contractor    Social History Main Topics  . Smoking status: Former Smoker -- 1.00 packs/day for 20 years    Types: Cigarettes    Quit date: 08/08/1984  . Smokeless tobacco: Never Used  . Alcohol Use: 1.2 oz/week    2 Cans of beer per week     Comment: socially   2 beer or mixed drinks week  . Drug Use: No  . Sexually Active: Not on file   Other Topics Concern  . Not on file   Social History Narrative   . No narrative on file     BP 148/78  Pulse 55  Ht 6' (1.829 m)  Wt 210 lb 12.8 oz (95.618 kg)  BMI 28.58 kg/m2  Physical Exam:  Well appearing 72 year old man,NAD HEENT: Unremarkable Neck:  7 cm JVD, no thyromegally Back:  No CVA tenderness Lungs:  Clear with no wheezes, rales, or rhonchi. HEART:  Regular rate rhythm, no murmurs, no rubs, no clicks Abd:  soft, positive bowel sounds, no organomegally, no rebound, no guarding Ext:  2 plus pulses, no edema, no cyanosis, no clubbing Skin:  No rashes no nodules Neuro:  CN II through XII intact, motor grossly intact  EKG - normal sinus rhythm   Assess/Plan:

## 2012-12-10 ENCOUNTER — Other Ambulatory Visit: Payer: Self-pay | Admitting: Internal Medicine

## 2012-12-10 NOTE — Telephone Encounter (Signed)
Refill done.  

## 2012-12-10 NOTE — Telephone Encounter (Signed)
Spoke to pharmacy. Pt still has refill left on file. Refill request sent in error.

## 2012-12-29 ENCOUNTER — Encounter: Payer: Self-pay | Admitting: Lab

## 2012-12-30 ENCOUNTER — Ambulatory Visit (INDEPENDENT_AMBULATORY_CARE_PROVIDER_SITE_OTHER): Payer: Medicare Other | Admitting: Internal Medicine

## 2012-12-30 ENCOUNTER — Encounter: Payer: Self-pay | Admitting: Internal Medicine

## 2012-12-30 VITALS — BP 110/78 | HR 64 | Ht 72.0 in | Wt 208.0 lb

## 2012-12-30 DIAGNOSIS — R209 Unspecified disturbances of skin sensation: Secondary | ICD-10-CM

## 2012-12-30 DIAGNOSIS — I10 Essential (primary) hypertension: Secondary | ICD-10-CM

## 2012-12-30 DIAGNOSIS — R7309 Other abnormal glucose: Secondary | ICD-10-CM

## 2012-12-30 DIAGNOSIS — E785 Hyperlipidemia, unspecified: Secondary | ICD-10-CM

## 2012-12-30 DIAGNOSIS — Z0000 Encounter for general adult medical examination without abnormal findings: Secondary | ICD-10-CM

## 2012-12-30 DIAGNOSIS — R739 Hyperglycemia, unspecified: Secondary | ICD-10-CM

## 2012-12-30 DIAGNOSIS — G4700 Insomnia, unspecified: Secondary | ICD-10-CM

## 2012-12-30 DIAGNOSIS — R252 Cramp and spasm: Secondary | ICD-10-CM

## 2012-12-30 LAB — HEMOGLOBIN A1C: Hgb A1c MFr Bld: 6 % (ref 4.6–6.5)

## 2012-12-30 LAB — BASIC METABOLIC PANEL
BUN: 21 mg/dL (ref 6–23)
Calcium: 8.8 mg/dL (ref 8.4–10.5)
Chloride: 108 mEq/L (ref 96–112)
Creatinine, Ser: 1.5 mg/dL (ref 0.4–1.5)

## 2012-12-30 LAB — LIPID PANEL
Total CHOL/HDL Ratio: 5
VLDL: 25 mg/dL (ref 0.0–40.0)

## 2012-12-30 LAB — AST: AST: 27 U/L (ref 0–37)

## 2012-12-30 LAB — LDL CHOLESTEROL, DIRECT: Direct LDL: 137.9 mg/dL

## 2012-12-30 MED ORDER — ALPRAZOLAM 0.5 MG PO TABS: ORAL_TABLET | ORAL | Status: DC

## 2012-12-30 NOTE — Assessment & Plan Note (Signed)
Good compliance with fenofibrate, labs

## 2012-12-30 NOTE — Assessment & Plan Note (Addendum)
Td 2009 pneumonia shot-- at age 72 per patient  shingles shot 10-09-07  Cscope @ age 3 in [place] per patient  (no report available ), repeated Cscope w/  Dr Russella Dar 06-2010 : +polyps, due for a repeat colonoscopy, letter frome GI printed for the patient, encouraged to call them noting he had a catheter ablation 09-2012, may  like to wait few months before cscope  counseled: diet, exercise

## 2012-12-30 NOTE — Assessment & Plan Note (Signed)
Symptoms well-controlled with gabapentin.

## 2012-12-30 NOTE — Assessment & Plan Note (Signed)
Refill Xanax

## 2012-12-30 NOTE — Assessment & Plan Note (Signed)
Uses Flexeril very seldom

## 2012-12-30 NOTE — Patient Instructions (Signed)

## 2012-12-30 NOTE — Progress Notes (Signed)
Subjective:    Patient ID: [name], male    DOB: 08/31/1940, 72 y.o.   MRN: [id number]  HPI Here for Medicare AWV:  1. Risk factors based on Past M, S, F history: yes  2. Physical Activities: active at home and yard, church work as well  3. Depression/mood: (-) screening  4. Hearing: decreased hearing, slt worse ---> s/p eval, was told hearing aid won't help; mild tinnitus x years, not getting worse  5. ADL's: totally independent  6. Fall Risk: no recent fall, see instructions  7. Home Safety: feels safe at home  8. Height, weight, &visual acuity: see VS, vision corrected , sees the eye doctor 9. Counseling: yes  10. Labs ordered based on risk factors: yes  11. Referral Coordination: if needed  12. Care Plan-- see a/p  13. Cognitive Assessment: alertness, memory and motor skills seem appropriate   in addition to above, we also discussed the following History of melanoma, sees dermatology twice a year. History of hypogonadism and prostate cancer, sees urology regularly. Insomnia, well-controlled with Xanax, needs a refill. History of back surgery with residual left foot numbness, on gabapentin. Symptoms okay. Atrial fibrillation, hypertension: Good medication compliance, BP was a slightly high when he visit with cardiology about 3 weeks ago, BP today is great. No ambulatory BPs.   Past Medical History  Diagnosis Date  . Hypertension   . Hyperlipidemia   . Hearing loss   . Atrial flutter 03-2012  . Headache     migraines x 3 in life  . Melanoma     melanoma left forearm 2005- no chemo  . Chronic back pain   . HYPOGONADISM, MALE 10/12/2007    Followup per urology    . PROSTATE CANCER, HX OF 10/06/2008    Status post surgery approximately 2010     Past Surgical History  Procedure Laterality Date  . Melanoma excision    . Colonoscopy w/ polypectomy    . Tonsillectomy  1947  . Rotator cuff repair      right  . Arhtroscopy  right knee  2010  . Lumbar  laminectomy/decompression microdiscectomy  08/16/2011    Procedure: LUMBAR LAMINECTOMY/DECOMPRESSION MICRODISCECTOMY;  Surgeon: Alvy Beal;  Location: [facility];  Service: Orthopedics;  Laterality: N/A;  Lumbar Decompression and Foraminotomy L4-S1    . Prostatectomy  07/2008  . Back surgery  07/2006    lumbar discectomy  . Cardiac catheterization      15 years ago  . Cardioversion  05/18/2012    Procedure: CARDIOVERSION;  Surgeon: Peter M Swaziland, MD;  Location: [facility] ENDOSCOPY;  Service: Cardiovascular;  Laterality: N/A;  on eloquis on-going  . Ablation of dysrhythmic focus  10/15/2012    Dr Ladona Ridgel      Family History: F lived until 72 y/o CAD - F (MI age 30) HTN - F, M DM - M, bro stroke - no colon Ca - no prostate Ca - bro, F  Social History: Married, 2 kids in [place] since '84 Works part time Former Smoker in the 80s Alcohol use-~ 1-2 drinks/wk) diet healthy most of the time per patient    Review of Systems No chest pain, shortness or breath or palpitations. Very mild pretibial edema at the end of the day. No dysuria, gross hematuria. Rarely  has problems with urinary incontinence.     Objective:   Physical Exam BP 110/78  Pulse 64  Ht 6' (1.829 m)  Wt 208 lb (94.348 kg)  BMI 28.2  kg/m2  SpO2 95%  General -- alert, well-developed, NAD.   Neck --no thyromegaly , normal carotid pulse Lungs -- normal respiratory effort, no intercostal retractions, no accessory muscle use, and normal breath sounds.   Heart-- normal rate, regular rhythm, no murmur, and no gallop.   Abdomen--soft, non-tender, no distention, no masses, no HSM, no guarding, and no rigidity.   Extremities-- no pretibial edema bilaterally Neurologic-- alert & oriented X3 and strength normal in all extremities. Psych-- Cognition and judgment appear intact. Alert and cooperative with normal attention span and concentration.  not anxious appearing and not depressed appearing.       Assessment & Plan:

## 2012-12-30 NOTE — Assessment & Plan Note (Addendum)
Under excellent control, check a BMP Also CBGs were slt elevated, check a A1C

## 2013-01-19 ENCOUNTER — Encounter: Payer: Self-pay | Admitting: Internal Medicine

## 2013-01-22 ENCOUNTER — Other Ambulatory Visit: Payer: Self-pay | Admitting: Internal Medicine

## 2013-01-22 NOTE — Telephone Encounter (Signed)
Refill done.  

## 2013-02-18 ENCOUNTER — Other Ambulatory Visit: Payer: Self-pay | Admitting: Internal Medicine

## 2013-02-18 NOTE — Telephone Encounter (Signed)
Refill done.  

## 2013-04-20 ENCOUNTER — Encounter: Payer: Self-pay | Admitting: Gastroenterology

## 2013-04-21 ENCOUNTER — Encounter: Payer: Self-pay | Admitting: Gastroenterology

## 2013-04-21 ENCOUNTER — Other Ambulatory Visit: Payer: Self-pay | Admitting: Internal Medicine

## 2013-04-21 NOTE — Telephone Encounter (Signed)
Med filled.  

## 2013-04-26 ENCOUNTER — Encounter: Payer: Self-pay | Admitting: Gastroenterology

## 2013-04-26 ENCOUNTER — Ambulatory Visit (AMBULATORY_SURGERY_CENTER): Payer: Self-pay

## 2013-04-26 VITALS — Ht 72.0 in | Wt 205.0 lb

## 2013-04-26 DIAGNOSIS — Z860100 Personal history of colon polyps, unspecified: Secondary | ICD-10-CM

## 2013-04-26 MED ORDER — MOVIPREP 100 G PO SOLR: 1.0000 | Freq: Once | ORAL | Status: DC

## 2013-05-06 ENCOUNTER — Encounter: Payer: Self-pay | Admitting: Gastroenterology

## 2013-05-06 ENCOUNTER — Ambulatory Visit (AMBULATORY_SURGERY_CENTER): Payer: Medicare Other | Admitting: Gastroenterology

## 2013-05-06 VITALS — BP 129/80 | HR 56 | Temp 96.4°F | Resp 14 | Ht 72.0 in | Wt 205.0 lb

## 2013-05-06 DIAGNOSIS — D126 Benign neoplasm of colon, unspecified: Secondary | ICD-10-CM

## 2013-05-06 DIAGNOSIS — Z86010 Personal history of colonic polyps: Secondary | ICD-10-CM

## 2013-05-06 MED ORDER — SODIUM CHLORIDE 0.9 % IV SOLN: 500.0000 mL | INTRAVENOUS | Status: DC

## 2013-05-06 NOTE — Progress Notes (Signed)
Patient did not experience any of the following events: a burn prior to discharge; a fall within the facility; wrong site/side/patient/procedure/implant event; or a hospital transfer or hospital admission upon discharge from the facility. (G8907) Patient did not have preoperative order for IV antibiotic SSI prophylaxis. (G8918)  

## 2013-05-06 NOTE — Progress Notes (Signed)
A/ox3 pleased with MAC, report to Jane RN 

## 2013-05-06 NOTE — Progress Notes (Signed)
Called to room to assist during endoscopic procedure.  Patient ID and intended procedure confirmed with present staff. Received instructions for my participation in the procedure from the performing physician.  

## 2013-05-06 NOTE — Patient Instructions (Addendum)

## 2013-05-06 NOTE — Op Note (Signed)
[facility] [address] [place], [address]   COLONOSCOPY PROCEDURE REPORT  PATIENT: [name], [name]  MR#: [id number] BIRTHDATE: 1941/06/18 , 72  yrs. old GENDER: Male ENDOSCOPIST: Meryl Dare, MD, [facility] PROCEDURE DATE:  05/06/2013 PROCEDURE:   Colonoscopy with snare polypectomy First Screening Colonoscopy - Avg.  risk and is 50 yrs.  old or older - No.  Prior Negative Screening - Now for repeat screening. N/A  History of Adenoma - Now for follow-up colonoscopy & has been > or = to 3 yrs.  Yes hx of adenoma.  Has been 3 or more years since last colonoscopy.  Polyps Removed Today? Yes. ASA CLASS:   Class II INDICATIONS:Patient's personal history of adenomatous colon polyps.  MEDICATIONS: MAC sedation, administered by CRNA and propofol (Diprivan) 200mg  IV DESCRIPTION OF PROCEDURE:   After the risks benefits and alternatives of the procedure were thoroughly explained, informed consent was obtained.  A digital rectal exam revealed no abnormalities of the rectum.   The LB [id number]  endoscope was introduced through the anus and advanced to the cecum, which was identified by both the appendix and ileocecal valve. No adverse events experienced.   The quality of the prep was good, using MoviPrep  The instrument was then slowly withdrawn as the colon was fully examined.  COLON FINDINGS: A sessile polyp measuring 7 mm in size was found in the transverse colon.  A polypectomy was performed with a cold snare.  The resection was complete and the polyp tissue was completely retrieved.   Moderate diverticulosis was noted in the sigmoid colon.  The colon was otherwise normal.  There was no diverticulosis, inflammation, polyps or cancers unless previously stated.  Retroflexed views revealed small internal hemorrhoids. The time to cecum=2 minutes 11 seconds.  Withdrawal time=11 minutes 33 seconds.  The scope was withdrawn and the procedure completed. COMPLICATIONS:  There were no complications.  ENDOSCOPIC IMPRESSION: 1.   Sessile polyp measuring 7 mm in the transverse colon; polypectomy performed with a cold snare 2.   Moderate diverticulosis was noted in the sigmoid colon 3.   Small internal hemorrhoids  RECOMMENDATIONS: 1.  Await pathology results 2.  High fiber diet with liberal fluid intake. 3.  Repeat Colonoscopy in 5 years.  eSigned:  Meryl Dare, MD, [facility] 05/06/2013 8:30 AM

## 2013-05-07 ENCOUNTER — Telehealth: Payer: Self-pay | Admitting: *Deleted

## 2013-05-07 NOTE — Telephone Encounter (Signed)
  Follow up Call-  Call back number 05/06/2013  Post procedure Call Back phone  # [phone]  Permission to leave phone message Yes     Patient questions:  Do you have a fever, pain , or abdominal swelling? no Pain Score  0 *  Have you tolerated food without any problems? yes  Have you been able to return to your normal activities? yes  Do you have any questions about your discharge instructions: Diet   no Medications  no Follow up visit  no  Do you have questions or concerns about your Care? no  Actions: * If pain score is 4 or above: No action needed, pain <4.

## 2013-05-11 ENCOUNTER — Encounter: Payer: Self-pay | Admitting: Gastroenterology

## 2013-05-26 ENCOUNTER — Ambulatory Visit (INDEPENDENT_AMBULATORY_CARE_PROVIDER_SITE_OTHER): Payer: Medicare Other

## 2013-05-26 DIAGNOSIS — Z23 Encounter for immunization: Secondary | ICD-10-CM

## 2013-06-30 ENCOUNTER — Ambulatory Visit (INDEPENDENT_AMBULATORY_CARE_PROVIDER_SITE_OTHER): Payer: Medicare Other | Admitting: Internal Medicine

## 2013-06-30 ENCOUNTER — Encounter: Payer: Self-pay | Admitting: Internal Medicine

## 2013-06-30 VITALS — BP 154/92 | HR 73 | Temp 98.2°F | Wt 212.4 lb

## 2013-06-30 DIAGNOSIS — E291 Testicular hypofunction: Secondary | ICD-10-CM

## 2013-06-30 DIAGNOSIS — I10 Essential (primary) hypertension: Secondary | ICD-10-CM

## 2013-06-30 DIAGNOSIS — G4700 Insomnia, unspecified: Secondary | ICD-10-CM

## 2013-06-30 LAB — BASIC METABOLIC PANEL
CO2: 24 mEq/L (ref 19–32)
Chloride: 106 mEq/L (ref 96–112)
Glucose, Bld: 105 mg/dL — ABNORMAL HIGH (ref 70–99)
Potassium: 3.7 mEq/L (ref 3.5–5.1)
Sodium: 138 mEq/L (ref 135–145)

## 2013-06-30 MED ORDER — FENOFIBRATE 160 MG PO TABS: ORAL_TABLET | ORAL | Status: DC

## 2013-06-30 NOTE — Assessment & Plan Note (Signed)
BP today slightly elevated, usually okay, check a BMP, followup by 12-2013 for a physical exam

## 2013-06-30 NOTE — Progress Notes (Signed)
  Subjective:    Patient ID: [name], male    DOB: Jun 23, 1941, 72 y.o.   MRN: [id number]  HPI Routine office visit Hypertension, good medication compliance, BP today slightly elevated but is usually okay; when he takes is actually around 112. Reports some stress, wife just had surgery.  Past Medical History  Diagnosis Date  . Hypertension   . Hyperlipidemia   . Hearing loss   . Atrial flutter 03-2012  . Headache(784.0)     migraines x 3 in life  . Melanoma     melanoma left forearm 2005- no chemo  . Chronic back pain   . HYPOGONADISM, MALE 10/12/2007    Followup per urology    . PROSTATE CANCER, HX OF 10/06/2008    Status post surgery approximately 2010     Past Surgical History  Procedure Laterality Date  . Melanoma excision  12/13/03    [facility]  . Colonoscopy w/ polypectomy    . Tonsillectomy  1947  . Rotator cuff repair  2008    right; Dr Francena Hanly  . Arhtroscopy  right knee  2010  . Lumbar laminectomy/decompression microdiscectomy  08/16/2011    Procedure: LUMBAR LAMINECTOMY/DECOMPRESSION MICRODISCECTOMY;  Surgeon: Alvy Beal;  Location: [facility];  Service: Orthopedics;  Laterality: N/A;  Lumbar Decompression and Foraminotomy L4-S1    . Prostatectomy  07/2008  . Back surgery  07/2006    lumbar discectomy  . Cardiac catheterization      15 years ago  . Cardioversion  05/18/2012    Procedure: CARDIOVERSION;  Surgeon: Peter M Swaziland, MD;  Location: [facility] ENDOSCOPY;  Service: Cardiovascular;  Laterality: N/A;  on eloquis on-going  . Ablation of dysrhythmic focus  10/15/2012    Dr Ladona Ridgel   History   Social History  . Marital Status: Married    Spouse Name: N/A    Number of Children: 2  . Years of Education: N/A   Occupational History  . estimator for contractor    Social History Main Topics  . Smoking status: Former Smoker -- 1.00 packs/day for 20 years    Types: Cigarettes    Quit date: 08/08/1984  . Smokeless tobacco: Never Used  . Alcohol Use:  1.2 oz/week    2 Cans of beer per week     Comment: socially   2 beer or mixed drinks week  . Drug Use: No  . Sexual Activity: Not on file   Other Topics Concern  . Not on file   Social History Narrative  . No narrative on file     Review of Systems Denies chest pain, SOB, no edema. No  nausea, vomiting, diarrhea    Objective:   Physical Exam BP 154/92  Pulse 73  Temp(Src) 98.2 F (36.8 C)  Wt 212 lb 6.4 oz (96.344 kg)  SpO2 100% General -- alert, well-developed, NAD. Lungs -- normal respiratory effort, no intercostal retractions, no accessory muscle use, and normal breath sounds.  Heart-- normal rate, regular rhythm, no murmur.   Extremities-- no pretibial edema bilaterally  Neurologic--  alert & oriented X3.  Psych-- Cognition and judgment appear intact. Cooperative with normal attention span and concentration. No anxious appearing , no depressed appearing.     Assessment & Plan:

## 2013-06-30 NOTE — Progress Notes (Signed)
Pre visit review using our clinic review tool, if applicable. No additional management support is needed unless otherwise documented below in the visit note. 

## 2013-06-30 NOTE — Assessment & Plan Note (Signed)
Followup by urology, recently the patient self decrease slightly the dose of testosterone d/t irritability

## 2013-06-30 NOTE — Patient Instructions (Signed)
Get your blood work before you leave  Next visit by 12-2013  for a physical exam . Fasting Please make an appointment

## 2013-06-30 NOTE — Assessment & Plan Note (Signed)
Xanax as needed, last UDS 12/2012, low risk, refill as needed

## 2013-07-20 ENCOUNTER — Telehealth: Payer: Self-pay | Admitting: *Deleted

## 2013-07-20 ENCOUNTER — Other Ambulatory Visit: Payer: Self-pay | Admitting: Internal Medicine

## 2013-07-20 MED ORDER — ALPRAZOLAM 0.5 MG PO TABS: ORAL_TABLET | ORAL | Status: DC

## 2013-07-20 NOTE — Telephone Encounter (Signed)
done

## 2013-07-20 NOTE — Telephone Encounter (Signed)
rx refill- xanax- 0.5mg  Last ov- 06/30/13 Last refilled- 12/30/12 #30/4rf  Last UDS- 12/30/12 LOW

## 2013-07-20 NOTE — Addendum Note (Signed)
Addended by: Willow Ora E on: 07/20/2013 04:45 PM   Modules accepted: Orders

## 2013-08-02 ENCOUNTER — Other Ambulatory Visit: Payer: Self-pay | Admitting: Dermatology

## 2013-08-22 ENCOUNTER — Other Ambulatory Visit: Payer: Self-pay | Admitting: Internal Medicine

## 2013-10-20 ENCOUNTER — Other Ambulatory Visit: Payer: Self-pay | Admitting: Internal Medicine

## 2013-12-01 ENCOUNTER — Other Ambulatory Visit: Payer: Self-pay | Admitting: Internal Medicine

## 2013-12-01 NOTE — Telephone Encounter (Signed)
Requesting Cyclobenzaprine 10mg -Take 1 tablet by mouth at bedtime as needed for muscle spasm. Last refill:11-25-12;#30,3 Last OV:06-30-13 Please advise.//AB/CMA

## 2013-12-08 ENCOUNTER — Other Ambulatory Visit: Payer: Self-pay | Admitting: *Deleted

## 2013-12-08 MED ORDER — CYCLOBENZAPRINE HCL 10 MG PO TABS: 10.0000 mg | ORAL_TABLET | Freq: Every evening | ORAL | Status: DC | PRN

## 2013-12-10 ENCOUNTER — Other Ambulatory Visit: Payer: Self-pay | Admitting: Internal Medicine

## 2013-12-23 ENCOUNTER — Other Ambulatory Visit: Payer: Self-pay | Admitting: Internal Medicine

## 2013-12-27 ENCOUNTER — Telehealth: Payer: Self-pay | Admitting: *Deleted

## 2013-12-27 MED ORDER — ALPRAZOLAM 0.5 MG PO TABS: ORAL_TABLET | ORAL | Status: DC

## 2013-12-27 NOTE — Telephone Encounter (Signed)
Prescription printed, Advise patient he is due for a fasting physical exam, will do a UDS when he comes back

## 2013-12-27 NOTE — Telephone Encounter (Signed)
Rx printed and faxed to the pharmacy.//AB/CMA 

## 2013-12-27 NOTE — Telephone Encounter (Signed)
Requesting Alprazolam 0.5mg -Take 1 tablet by mouth every day at bedtime as needed for sleep. Last refill:07-20-13;#30,4 Last OV:06-30-13 UDS:12-30-12-Low-Due Please advise.//AB/CMA

## 2013-12-27 NOTE — Telephone Encounter (Signed)
Spoke with the pt and informed him that the rx he requested has been approved and will be faxed to the pharmacy.  Also informed the pt of the note below.  Pt stated that he is scheduled for an appt on Friday (12-31-13).  Pt also requested information on why he needs to do the UDS.  Informed the pt that I will give him the information when he comes in for his appt.  Pt agreed.  Rx faxed to the pharmacy.//AB/CMA

## 2013-12-27 NOTE — Telephone Encounter (Signed)
Waiting approval from Dr. Paz.//AB/CMA 

## 2014-01-03 ENCOUNTER — Ambulatory Visit (INDEPENDENT_AMBULATORY_CARE_PROVIDER_SITE_OTHER): Payer: Medicare Other | Admitting: Internal Medicine

## 2014-01-03 ENCOUNTER — Encounter: Payer: Self-pay | Admitting: Internal Medicine

## 2014-01-03 VITALS — BP 150/69 | HR 63 | Temp 98.3°F | Ht 72.0 in | Wt 213.0 lb

## 2014-01-03 DIAGNOSIS — R7309 Other abnormal glucose: Secondary | ICD-10-CM

## 2014-01-03 DIAGNOSIS — E291 Testicular hypofunction: Secondary | ICD-10-CM

## 2014-01-03 DIAGNOSIS — Z23 Encounter for immunization: Secondary | ICD-10-CM

## 2014-01-03 DIAGNOSIS — I4892 Unspecified atrial flutter: Secondary | ICD-10-CM

## 2014-01-03 DIAGNOSIS — I10 Essential (primary) hypertension: Secondary | ICD-10-CM

## 2014-01-03 DIAGNOSIS — Z0000 Encounter for general adult medical examination without abnormal findings: Secondary | ICD-10-CM

## 2014-01-03 DIAGNOSIS — R7303 Prediabetes: Secondary | ICD-10-CM | POA: Insufficient documentation

## 2014-01-03 DIAGNOSIS — G4700 Insomnia, unspecified: Secondary | ICD-10-CM

## 2014-01-03 DIAGNOSIS — E785 Hyperlipidemia, unspecified: Secondary | ICD-10-CM

## 2014-01-03 DIAGNOSIS — R252 Cramp and spasm: Secondary | ICD-10-CM

## 2014-01-03 LAB — COMPREHENSIVE METABOLIC PANEL
ALK PHOS: 44 U/L (ref 39–117)
ALT: 21 U/L (ref 0–53)
AST: 31 U/L (ref 0–37)
Albumin: 4.2 g/dL (ref 3.5–5.2)
BUN: 26 mg/dL — ABNORMAL HIGH (ref 6–23)
CO2: 28 meq/L (ref 19–32)
Calcium: 9.1 mg/dL (ref 8.4–10.5)
Chloride: 107 mEq/L (ref 96–112)
Creatinine, Ser: 1.6 mg/dL — ABNORMAL HIGH (ref 0.4–1.5)
GFR: 46.61 mL/min — AB (ref >60.00)
GLUCOSE: 97 mg/dL (ref 70–99)
Potassium: 4.3 mEq/L (ref 3.5–5.1)
SODIUM: 142 meq/L (ref 135–145)
TOTAL PROTEIN: 7.3 g/dL (ref 6.0–8.3)
Total Bilirubin: 0.9 mg/dL (ref 0.2–1.2)

## 2014-01-03 LAB — LIPID PANEL
CHOLESTEROL: 240 mg/dL — AB (ref 0–200)
HDL: 49.3 mg/dL (ref >39.00)
LDL Cholesterol: 162 mg/dL — ABNORMAL HIGH (ref 0–99)
Total CHOL/HDL Ratio: 5
Triglycerides: 144 mg/dL (ref 0.0–149.0)
VLDL: 28.8 mg/dL (ref 0.0–40.0)

## 2014-01-03 LAB — TSH: TSH: 1.07 u[IU]/mL (ref 0.35–4.50)

## 2014-01-03 LAB — HEMOGLOBIN A1C: HEMOGLOBIN A1C: 6.2 % (ref 4.6–6.5)

## 2014-01-03 MED ORDER — RIVAROXABAN 20 MG PO TABS: 20.0000 mg | ORAL_TABLET | Freq: Every day | ORAL | Status: DC

## 2014-01-03 NOTE — Progress Notes (Signed)
Pre-visit discussion using our clinic review tool. No additional management support is needed unless otherwise documented below in the visit note.  

## 2014-01-03 NOTE — Progress Notes (Signed)
Subjective:    Patient ID: [name], male    DOB: 03/18/1941, 73 y.o.   MRN: [id number]  DOS:  01/03/2014 Type of  visit: Here for Medicare AWV:   1. Risk factors based on Past M, S, F history: yes   2. Physical Activities: active at home yard, church   3. Depression/mood: (-) screening   4. Hearing: decreased hearing, slt worse?  ---> s/p eval, was told hearing aid won't help; mild tinnitus x years (stable)  5. ADL's: totally independent, drives 6. Fall Risk: no recent fall, see instructions   7. Home Safety: feels safe at home   8. Height, weight, &visual acuity: see VS, vision corrected , sees the eye doctor 9. Counseling: yes   10. Labs ordered based on risk factors: yes   11. Referral Coordination: if needed   12. Care Plan-- see a/p   13. Cognitive Assessment: alertness, memory and motor skills seem appropriate   in addition to above, we also discussed the following Hypertension, BP today slightly elevated but reports that BPs at home. H/o Atrial fibrillation, asymptomatic. Insomnia, well-controlled with Xanax Leg  cramps, on Flexeril as needed Hyperlipidemia, good compliance with medication Hypogonadism, self discontinued HRT, followup by urology   ROS No  CP, SOB No palpitations  Denies  nausea, vomiting diarrhea Denies  blood in the stools (-) cough, sputum production (-) wheezing, chest congestion occ sinus PND No dysuria, gross hematuria, difficulty urinating     Past Medical History  Diagnosis Date  . Hypertension   . Hyperlipidemia   . Hearing loss   . Atrial flutter 03-2012  . Headache(784.0)     migraines x 3 in life  . Melanoma     melanoma left forearm 2005- no chemo  . Chronic back pain   . HYPOGONADISM, MALE 10/12/2007    Followup per urology    . PROSTATE CANCER, HX OF 10/06/2008    Status post surgery approximately 2010      Past Surgical History  Procedure Laterality Date  . Melanoma excision  12/13/03    [facility]  .  Colonoscopy w/ polypectomy    . Tonsillectomy  1947  . Rotator cuff repair  2008    right; Dr Justice Britain  . Arhtroscopy  right knee  2010  . Lumbar laminectomy/decompression microdiscectomy  08/16/2011    Procedure: LUMBAR LAMINECTOMY/DECOMPRESSION MICRODISCECTOMY;  Surgeon: Dahlia Bailiff;  Location: [facility];  Service: Orthopedics;  Laterality: N/A;  Lumbar Decompression and Foraminotomy L4-S1    . Prostatectomy  07/2008  . Back surgery  07/2006    lumbar discectomy  . Cardiac catheterization      15 years ago  . Cardioversion  05/18/2012    Procedure: CARDIOVERSION;  Surgeon: Peter M Martinique, MD;  Location: [facility] ENDOSCOPY;  Service: Cardiovascular;  Laterality: N/A;  on eloquis on-going  . Ablation of dysrhythmic focus  10/15/2012    Dr Lovena Le    History   Social History  . Marital Status: Married    Spouse Name: N/A    Number of Children: 2  . Years of Education: N/A   Occupational History  . estimator for contractor    Social History Main Topics  . Smoking status: Former Smoker -- 1.00 packs/day for 20 years    Types: Cigarettes    Quit date: 08/08/1984  . Smokeless tobacco: Never Used  . Alcohol Use: 1.2 oz/week    2 Cans of beer per week  Comment: socially   2 beer or mixed drinks week  . Drug Use: No  . Sexual Activity: Not on file   Other Topics Concern  . Not on file   Social History Narrative   in [place] since '84        Medication List       This list is accurate as of: 01/03/14 11:59 PM.  Always use your most recent med list.               ALPRAZolam 0.5 MG tablet  Commonly known as:  XANAX  TAKE 1 TABLET BY MOUTH AT BEDTIME AS NEEDED FOR SLEEP     benazepril 40 MG tablet  Commonly known as:  LOTENSIN  TAKE 1 TABLET BY MOUTH EVERY DAY     cyclobenzaprine 10 MG tablet  Commonly known as:  FLEXERIL  TAKE 1 TABLET BY MOUTH AT BEDTIME AS NEEDED FOR MUSCLE SPASM     diltiazem 240 MG 24 hr capsule  Commonly known as:  TIAZAC  TAKE 1 CAPSULE  DAILY.     fenofibrate 160 MG tablet  TAKE 1 TABLET BY MOUTH EVERY DAY     FISH OIL + D3 1200-1000 MG-UNIT Caps  Take 1 capsule by mouth every morning.     gabapentin 300 MG capsule  Commonly known as:  NEURONTIN  Take 300 mg by mouth 2 (two) times daily.     multivitamin with minerals Tabs tablet  Take 1 tablet by mouth daily.     rivaroxaban 20 MG Tabs tablet  Commonly known as:  XARELTO  Take 1 tablet (20 mg total) by mouth daily with supper.     vitamin B-12 1000 MCG tablet  Commonly known as:  CYANOCOBALAMIN  Take 1,000 mcg by mouth daily.           Objective:   Physical Exam BP 150/69  Pulse 63  Temp(Src) 98.3 F (36.8 C) (Oral)  Ht 6' (1.829 m)  Wt 213 lb (96.616 kg)  BMI 28.88 kg/m2  SpO2 93% General -- alert, well-developed, NAD.  Neck --no thyromegaly , normal carotid pulse HEENT-- Not pale.   Lungs -- normal respiratory effort, no intercostal retractions, no accessory muscle use, and normal breath sounds.  Heart-- regular?,  no murmur.  Abdomen-- Not distended, good bowel sounds,soft, non-tender. Extremities-- no pretibial edema bilaterally  Neurologic--  alert & oriented X3. Speech normal, gait normal, strength normal in all extremities.  Psych-- Cognition and judgment appear intact. Cooperative with normal attention span and concentration. No anxious or depressed appearing.     Assessment & Plan:

## 2014-01-03 NOTE — Assessment & Plan Note (Signed)
Well-controlled with Xanax as needed, getting a UDS today

## 2014-01-03 NOTE — Patient Instructions (Signed)
Get your blood work before you leave   Check the  blood pressure 2 or 3 times a month be sure it is between 110/60 and 140/85. Ideal blood pressure is 120/80. If it is consistently higher or lower, let me know    Next visit is for routine check up regards your blood pressure  in 6 months  No need to come back fasting Please make an appointment     Fall Prevention and [place] cause injuries and can affect all age groups. It is possible to use preventive measures to significantly decrease the likelihood of falls. There are many simple measures which can make your home safer and prevent falls. OUTDOORS  Repair cracks and edges of walkways and driveways.  Remove high doorway thresholds.  Trim shrubbery on the main path into your home.  Have good outside lighting.  Clear walkways of tools, rocks, debris, and clutter.  Check that handrails are not broken and are securely fastened. Both sides of steps should have handrails.  Have leaves, snow, and ice cleared regularly.  Use sand or salt on walkways during winter months.  In the garage, clean up grease or oil spills. BATHROOM  Install night lights.  Install grab bars by the toilet and in the tub and shower.  Use non-skid mats or decals in the tub or shower.  Place a plastic non-slip stool in the shower to sit on, if needed.  Keep floors dry and clean up all water on the floor immediately.  Remove soap buildup in the tub or shower on a regular basis.  Secure bath mats with non-slip, double-sided rug tape.  Remove throw rugs and tripping hazards from the floors. BEDROOMS  Install night lights.  Make sure a bedside light is easy to reach.  Do not use oversized bedding.  Keep a telephone by your bedside.  Have a firm chair with side arms to use for getting dressed.  Remove throw rugs and tripping hazards from the floor. KITCHEN  Keep handles on pots and pans turned toward the center of the stove. Use back  burners when possible.  Clean up spills quickly and allow time for drying.  Avoid walking on wet floors.  Avoid hot utensils and knives.  Position shelves so they are not too high or low.  Place commonly used objects within easy reach.  If necessary, use a sturdy step stool with a grab bar when reaching.  Keep electrical cables out of the way.  Do not use floor polish or wax that makes floors slippery. If you must use wax, use non-skid floor wax.  Remove throw rugs and tripping hazards from the floor. STAIRWAYS  Never leave objects on stairs.  Place handrails on both sides of stairways and use them. Fix any loose handrails. Make sure handrails on both sides of the stairways are as long as the stairs.  Check carpeting to make sure it is firmly attached along stairs. Make repairs to worn or loose carpet promptly.  Avoid placing throw rugs at the top or bottom of stairways, or properly secure the rug with carpet tape to prevent slippage. Get rid of throw rugs, if possible.  Have an electrician put in a light switch at the top and bottom of the stairs. OTHER FALL PREVENTION TIPS  Wear low-heel or rubber-soled shoes that are supportive and fit well. Wear closed toe shoes.  When using a stepladder, make sure it is fully opened and both spreaders are firmly locked. Do not  climb a closed stepladder.  Add color or contrast paint or tape to grab bars and handrails in your home. Place contrasting color strips on first and last steps.  Learn and use mobility aids as needed. Install an electrical emergency response system.  Turn on lights to avoid dark areas. Replace light bulbs that burn out immediately. Get light switches that glow.  Arrange furniture to create clear pathways. Keep furniture in the same place.  Firmly attach carpet with non-skid or double-sided tape.  Eliminate uneven floor surfaces.  Select a carpet pattern that does not visually hide the edge of steps.  Be  aware of all pets. OTHER HOME SAFETY TIPS  Set the water temperature for 120 F (48.8 C).  Keep emergency numbers on or near the telephone.  Keep smoke detectors on every level of the home and near sleeping areas. Document Released: 07/26/2002 Document Revised: 02/04/2012 Document Reviewed: 10/25/2011 [facility] Patient Information 2014 [place].

## 2014-01-03 NOTE — Assessment & Plan Note (Signed)
Good medication compliance, check labs. 

## 2014-01-03 NOTE — Assessment & Plan Note (Signed)
Patient is in a flutter, EKG discuss with cardiology. Plan: Switch from aspirin to xarelto 20 mg q d, samples a nd a rx provided  Refer back to Dr. Lovena Le

## 2014-01-03 NOTE — Assessment & Plan Note (Addendum)
Td 2009 pneumonia shot-- at age 73 per patient  prevnar today shingles shot 10-09-07  Cscope @ age 72 in [place] per patient  (no report available ), repeated Cscope w/  Dr Fuller Plan 06-2010 : +polyps, Cscope again 04-2013, next per GI  counseled: diet, exercise

## 2014-01-03 NOTE — Assessment & Plan Note (Signed)
On Flexeril when necessary

## 2014-01-03 NOTE — Assessment & Plan Note (Signed)
bP today slightly elevated, reports readings at home are better. Recommend to check his BP regularly, follow up 6 months

## 2014-01-03 NOTE — Assessment & Plan Note (Signed)
Diagnoses discussed with the patient, recheck A1c

## 2014-01-03 NOTE — Assessment & Plan Note (Signed)
followup by urology, self DC HRT, he noticed himself having slightly more aggressive behavior

## 2014-01-04 ENCOUNTER — Telehealth: Payer: Self-pay | Admitting: Internal Medicine

## 2014-01-04 ENCOUNTER — Encounter: Payer: Self-pay | Admitting: *Deleted

## 2014-01-04 ENCOUNTER — Encounter: Payer: Self-pay | Admitting: Internal Medicine

## 2014-01-04 ENCOUNTER — Ambulatory Visit (INDEPENDENT_AMBULATORY_CARE_PROVIDER_SITE_OTHER): Payer: Medicare Other | Admitting: Internal Medicine

## 2014-01-04 VITALS — BP 132/87 | HR 98 | Ht 72.0 in | Wt 214.4 lb

## 2014-01-04 DIAGNOSIS — I10 Essential (primary) hypertension: Secondary | ICD-10-CM

## 2014-01-04 DIAGNOSIS — I4892 Unspecified atrial flutter: Secondary | ICD-10-CM

## 2014-01-04 MED ORDER — APIXABAN 5 MG PO TABS: 5.0000 mg | ORAL_TABLET | Freq: Two times a day (BID) | ORAL | Status: DC

## 2014-01-04 NOTE — Assessment & Plan Note (Signed)
He has had a recurrence. I have discussed the treatment options with the patient and he wishes to proceed with repeat catheter ablation. Will plan to use an irrigated catheter.

## 2014-01-04 NOTE — Telephone Encounter (Signed)
Relevant patient education assigned to patient using Emmi. ° °

## 2014-01-04 NOTE — Telephone Encounter (Signed)
New Message:  Pt wants to know if he should discontinue his aspirin since he started eloquis today

## 2014-01-04 NOTE — Telephone Encounter (Signed)
No stop aspirin

## 2014-01-04 NOTE — Progress Notes (Signed)
HPI [name] returns today for followup. He is a 73-year-old man with atrial flutter, who underwent catheter ablation over a year ago. 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 In the interim, he has done well. He denies chest pain, shortness of breath, or palpitations. No syncope. He was found on routine physical exam to have recurrent atrial flutter and returns for additional evaluation. He has been under increased stress but otherwise ok.   Allergies  Allergen Reactions  . Statins Other (See Comments)    Body aches     Current Outpatient Prescriptions  Medication Sig Dispense Refill  . ALPRAZolam (XANAX) 0.5 MG tablet TAKE 1 TABLET BY MOUTH AT BEDTIME AS NEEDED FOR SLEEP  30 tablet  1  . benazepril (LOTENSIN) 40 MG tablet TAKE 1 TABLET BY MOUTH EVERY DAY  30 tablet  6  . cyclobenzaprine (FLEXERIL) 10 MG tablet TAKE 1 TABLET BY MOUTH AT BEDTIME AS NEEDED FOR MUSCLE SPASM  30 tablet  2  . diltiazem (TIAZAC) 240 MG 24 hr capsule TAKE 1 CAPSULE DAILY.  30 capsule  6  . fenofibrate 160 MG tablet TAKE 1 TABLET BY MOUTH EVERY DAY  30 tablet  12  . Fish Oil-Cholecalciferol (FISH OIL + D3) 1200-1000 MG-UNIT CAPS Take 1 capsule by mouth every morning.      . gabapentin (NEURONTIN) 300 MG capsule Take 300 mg by mouth 2 (two) times daily.      . Multiple Vitamin (MULTIVITAMIN WITH MINERALS) TABS Take 1 tablet by mouth daily.      . vitamin B-12 (CYANOCOBALAMIN) 1000 MCG tablet Take 1,000 mcg by mouth daily.      . rivaroxaban (XARELTO) 20 MG TABS tablet Take 1 tablet (20 mg total) by mouth daily with supper.  30 tablet  4  . [DISCONTINUED] pravastatin (PRAVACHOL) 20 MG tablet Take 1 tablet (20 mg total) by mouth daily.  30 tablet  6   No current facility-administered medications for this visit.     Past Medical History  Diagnosis Date  . Hypertension   . Hyperlipidemia   . Hearing loss   . Atrial flutter 03-2012   . Headache(784.0)     migraines x 3 in life  . Melanoma     melanoma left forearm 2005- no chemo  . Chronic back pain   . HYPOGONADISM, MALE 10/12/2007    Followup per urology    . PROSTATE CANCER, HX OF 10/06/2008    Status post surgery approximately 2010      ROS:   All systems reviewed and negative except as noted in the HPI.   Past Surgical History  Procedure Laterality Date  . Melanoma excision  12/13/03    [place] [facility]  . Colonoscopy w/ polypectomy    . Tonsillectomy  1947  . Rotator cuff repair  2008    right; Dr Kevin Supple  . Arhtroscopy  right knee  2010  . Lumbar laminectomy/decompression microdiscectomy  08/16/2011    Procedure: LUMBAR LAMINECTOMY/DECOMPRESSION MICRODISCECTOMY;  Surgeon: Dahari D Brooks;  Location: [facility];  Service: Orthopedics;  Laterality: N/A;  Lumbar Decompression and Foraminotomy L4-S1    . Prostatectomy  07/2008  . Back surgery  07/2006    lumbar discectomy  . Cardiac catheterization      15 years ago  . Cardioversion  05/18/2012    Procedure: CARDIOVERSION;  Surgeon: Peter M Jordan, MD;  Location: [facility];  Service: Cardiovascular;  Laterality: N/A;    on eloquis on-going  . Ablation of dysrhythmic focus  10/15/2012    Dr Azile Minardi     Family History  Problem Relation Age of Onset  . Pancreatic cancer Mother   . Esophageal cancer Neg Hx   . Stomach cancer Neg Hx   . Rectal cancer Neg Hx   . Colon cancer Brother      History   Social History  . Marital Status: Married    Spouse Name: N/A    Number of Children: 2  . Years of Education: N/A   Occupational History  . estimator for contractor    Social History Main Topics  . Smoking status: Former Smoker -- 1.00 packs/day for 20 years    Types: Cigarettes    Quit date: 08/08/1984  . Smokeless tobacco: Never Used  . Alcohol Use: 1.2 oz/week    2 Cans of beer per week     Comment: socially   2 beer or mixed drinks week  . Drug Use: No  . Sexual Activity: Not on file    Other Topics Concern  . Not on file   Social History Narrative   in [place] since '84     BP 132/87  Pulse 98  Ht 6' (1.829 m)  Wt 214 lb 6.4 oz (97.251 kg)  BMI 29.07 kg/m2  Physical Exam:  Well appearing 72-year-old man,NAD HEENT: Unremarkable Neck:  7 cm JVD, no thyromegally Back:  No CVA tenderness Lungs:  Clear with no wheezes, rales, or rhonchi. HEART:  Regular rate rhythm, no murmurs, no rubs, no clicks Abd:  soft, positive bowel sounds, no organomegally, no rebound, no guarding Ext:  2 plus pulses, no edema, no cyanosis, no clubbing Skin:  No rashes no nodules Neuro:  CN II through XII intact, motor grossly intact  EKG - atrial flutter with a controlled VR   Assess/Plan: 

## 2014-01-04 NOTE — Patient Instructions (Signed)
Your physician has recommended that you have an ablation. Catheter ablation is a medical procedure used to treat some cardiac arrhythmias (irregular heartbeats). During catheter ablation, a long, thin, flexible tube is put into a blood vessel in your groin (upper thigh), or neck. This tube is called an ablation catheter. It is then guided to your heart through the blood vessel. Radio frequency waves destroy small areas of heart tissue where abnormal heartbeats may cause an arrhythmia to start. Please see the instruction sheet given to you today.  See instruction sheet for procedure  Your physician has recommended you make the following change in your medication:  1) Start Eliquis 5mg  twice daily

## 2014-01-04 NOTE — Assessment & Plan Note (Signed)
His blood pressure is well controlled. Will plan to continue his current meds.

## 2014-01-05 ENCOUNTER — Encounter: Payer: Self-pay | Admitting: Internal Medicine

## 2014-01-07 ENCOUNTER — Other Ambulatory Visit: Payer: Self-pay | Admitting: Internal Medicine

## 2014-01-13 ENCOUNTER — Telehealth: Payer: Self-pay | Admitting: *Deleted

## 2014-01-13 NOTE — Telephone Encounter (Signed)
UDS MODERATE 01/03/14

## 2014-01-14 ENCOUNTER — Encounter (HOSPITAL_COMMUNITY): Payer: Self-pay

## 2014-01-17 ENCOUNTER — Other Ambulatory Visit (INDEPENDENT_AMBULATORY_CARE_PROVIDER_SITE_OTHER): Payer: Medicare Other

## 2014-01-17 DIAGNOSIS — I10 Essential (primary) hypertension: Secondary | ICD-10-CM

## 2014-01-17 DIAGNOSIS — I4892 Unspecified atrial flutter: Secondary | ICD-10-CM

## 2014-01-17 LAB — CBC WITH DIFFERENTIAL/PLATELET
BASOS ABS: 0 10*3/uL (ref 0.0–0.1)
Basophils Relative: 0.6 % (ref 0.0–3.0)
EOS PCT: 3 % (ref 0.0–5.0)
Eosinophils Absolute: 0.2 10*3/uL (ref 0.0–0.7)
HCT: 44.4 % (ref 39.0–52.0)
Hemoglobin: 15.4 g/dL (ref 13.0–17.0)
Lymphocytes Relative: 30.8 % (ref 12.0–46.0)
Lymphs Abs: 2 10*3/uL (ref 0.7–4.0)
MCHC: 34.8 g/dL (ref 30.0–36.0)
MCV: 90.4 fl (ref 78.0–100.0)
MONOS PCT: 7.9 % (ref 3.0–12.0)
Monocytes Absolute: 0.5 10*3/uL (ref 0.1–1.0)
NEUTROS PCT: 57.7 % (ref 43.0–77.0)
Neutro Abs: 3.7 10*3/uL (ref 1.4–7.7)
PLATELETS: 175 10*3/uL (ref 150.0–400.0)
RBC: 4.91 Mil/uL (ref 4.22–5.81)
RDW: 13.4 % (ref 11.5–15.5)
WBC: 6.4 10*3/uL (ref 4.0–10.5)

## 2014-01-17 LAB — BASIC METABOLIC PANEL
BUN: 15 mg/dL (ref 6–23)
CALCIUM: 9.2 mg/dL (ref 8.4–10.5)
CO2: 24 mEq/L (ref 19–32)
CREATININE: 1.3 mg/dL (ref 0.4–1.5)
Chloride: 108 mEq/L (ref 96–112)
GFR: 59.09 mL/min — AB (ref >60.00)
GLUCOSE: 92 mg/dL (ref 70–99)
Potassium: 3.9 mEq/L (ref 3.5–5.1)
Sodium: 139 mEq/L (ref 135–145)

## 2014-01-24 ENCOUNTER — Ambulatory Visit (HOSPITAL_COMMUNITY)
Admission: RE | Admit: 2014-01-24 | Discharge: 2014-01-25 | Disposition: A | Discharge status: Home / Self Care | Payer: Medicare Other | Source: Ambulatory Visit | Attending: Internal Medicine | Admitting: Internal Medicine

## 2014-01-24 ENCOUNTER — Encounter: Payer: Self-pay | Admitting: Internal Medicine

## 2014-01-24 ENCOUNTER — Encounter (HOSPITAL_COMMUNITY)
Admission: RE | Disposition: A | Discharge status: Home / Self Care | Payer: Self-pay | Source: Ambulatory Visit | Attending: Internal Medicine

## 2014-01-24 DIAGNOSIS — I4892 Unspecified atrial flutter: Secondary | ICD-10-CM | POA: Insufficient documentation

## 2014-01-24 DIAGNOSIS — M549 Dorsalgia, unspecified: Secondary | ICD-10-CM | POA: Insufficient documentation

## 2014-01-24 DIAGNOSIS — E785 Hyperlipidemia, unspecified: Secondary | ICD-10-CM | POA: Insufficient documentation

## 2014-01-24 DIAGNOSIS — Z85820 Personal history of malignant melanoma of skin: Secondary | ICD-10-CM | POA: Insufficient documentation

## 2014-01-24 DIAGNOSIS — Z7901 Long term (current) use of anticoagulants: Secondary | ICD-10-CM | POA: Insufficient documentation

## 2014-01-24 DIAGNOSIS — I10 Essential (primary) hypertension: Secondary | ICD-10-CM | POA: Insufficient documentation

## 2014-01-24 DIAGNOSIS — G8929 Other chronic pain: Secondary | ICD-10-CM | POA: Insufficient documentation

## 2014-01-24 DIAGNOSIS — I4891 Unspecified atrial fibrillation: Secondary | ICD-10-CM | POA: Diagnosis present

## 2014-01-24 DIAGNOSIS — Z8546 Personal history of malignant neoplasm of prostate: Secondary | ICD-10-CM | POA: Insufficient documentation

## 2014-01-24 HISTORY — PX: ATRIAL FLUTTER ABLATION: SHX5733

## 2014-01-24 HISTORY — PX: ABLATION: SHX5711

## 2014-01-24 SURGERY — ATRIAL FLUTTER ABLATION
Anesthesia: LOCAL

## 2014-01-24 MED ORDER — BUPIVACAINE HCL (PF) 0.25 % IJ SOLN
INTRAMUSCULAR | Status: AC
  Filled 2014-01-24: qty 60

## 2014-01-24 MED ORDER — BENAZEPRIL HCL 40 MG PO TABS
40.0000 mg | ORAL_TABLET | Freq: Every day | ORAL | Status: DC
  Filled 2014-01-24: qty 1

## 2014-01-24 MED ORDER — CYCLOBENZAPRINE HCL 10 MG PO TABS
10.0000 mg | ORAL_TABLET | Freq: Every day | ORAL | Status: DC | PRN
  Filled 2014-01-24: qty 1

## 2014-01-24 MED ORDER — SODIUM CHLORIDE 0.9 % IJ SOLN
3.0000 mL | Freq: Two times a day (BID) | INTRAMUSCULAR | Status: DC
  Administered 2014-01-24 (×2): 3 mL via INTRAVENOUS

## 2014-01-24 MED ORDER — GABAPENTIN 300 MG PO CAPS
300.0000 mg | ORAL_CAPSULE | Freq: Two times a day (BID) | ORAL | Status: DC
  Administered 2014-01-24: 300 mg via ORAL
  Filled 2014-01-24 (×3): qty 1

## 2014-01-24 MED ORDER — FENOFIBRATE 160 MG PO TABS
160.0000 mg | ORAL_TABLET | Freq: Every day | ORAL | Status: DC
  Filled 2014-01-24: qty 1

## 2014-01-24 MED ORDER — MIDAZOLAM HCL 5 MG/5ML IJ SOLN
INTRAMUSCULAR | Status: AC
  Filled 2014-01-24: qty 5

## 2014-01-24 MED ORDER — ALPRAZOLAM 0.5 MG PO TABS: 0.5000 mg | ORAL_TABLET | Freq: Every evening | ORAL | Status: DC | PRN

## 2014-01-24 MED ORDER — VITAMIN B-12 1000 MCG PO TABS
1000.0000 ug | ORAL_TABLET | Freq: Every day | ORAL | Status: DC
  Filled 2014-01-24 (×3): qty 1

## 2014-01-24 MED ORDER — APIXABAN 5 MG PO TABS
5.0000 mg | ORAL_TABLET | Freq: Two times a day (BID) | ORAL | Status: DC
  Administered 2014-01-24: 5 mg via ORAL
  Filled 2014-01-24 (×3): qty 1

## 2014-01-24 MED ORDER — ADULT MULTIVITAMIN W/MINERALS CH
1.0000 | ORAL_TABLET | Freq: Every day | ORAL | Status: DC
  Administered 2014-01-24: 1 via ORAL
  Filled 2014-01-24 (×2): qty 1

## 2014-01-24 MED ORDER — SODIUM CHLORIDE 0.9 % IV SOLN: 250.0000 mL | INTRAVENOUS | Status: DC | PRN

## 2014-01-24 MED ORDER — HEPARIN (PORCINE) IN NACL 2-0.9 UNIT/ML-% IJ SOLN
INTRAMUSCULAR | Status: AC
  Filled 2014-01-24: qty 500

## 2014-01-24 MED ORDER — SODIUM CHLORIDE 0.9 % IJ SOLN: 3.0000 mL | INTRAMUSCULAR | Status: DC | PRN

## 2014-01-24 MED ORDER — FENTANYL CITRATE 0.05 MG/ML IJ SOLN
INTRAMUSCULAR | Status: AC
  Filled 2014-01-24: qty 2

## 2014-01-24 MED ORDER — DILTIAZEM HCL ER BEADS 240 MG PO CP24
240.0000 mg | ORAL_CAPSULE | Freq: Every day | ORAL | Status: DC
  Filled 2014-01-24: qty 1

## 2014-01-24 MED ORDER — HEPARIN SODIUM (PORCINE) 1000 UNIT/ML IJ SOLN
INTRAMUSCULAR | Status: AC
  Filled 2014-01-24: qty 1

## 2014-01-24 NOTE — H&P (View-Only) (Signed)
HPI [name] returns today for followup. He is a 73 year old man with atrial flutter, who underwent catheter ablation over a year ago. 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 In the interim, he has done well. He denies chest pain, shortness of breath, or palpitations. No syncope. He was found on routine physical exam to have recurrent atrial flutter and returns for additional evaluation. He has been under increased stress but otherwise ok.   Allergies  Allergen Reactions  . Statins Other (See Comments)    Body aches     Current Outpatient Prescriptions  Medication Sig Dispense Refill  . ALPRAZolam (XANAX) 0.5 MG tablet TAKE 1 TABLET BY MOUTH AT BEDTIME AS NEEDED FOR SLEEP  30 tablet  1  . benazepril (LOTENSIN) 40 MG tablet TAKE 1 TABLET BY MOUTH EVERY DAY  30 tablet  6  . cyclobenzaprine (FLEXERIL) 10 MG tablet TAKE 1 TABLET BY MOUTH AT BEDTIME AS NEEDED FOR MUSCLE SPASM  30 tablet  2  . diltiazem (TIAZAC) 240 MG 24 hr capsule TAKE 1 CAPSULE DAILY.  30 capsule  6  . fenofibrate 160 MG tablet TAKE 1 TABLET BY MOUTH EVERY DAY  30 tablet  12  . Fish Oil-Cholecalciferol (FISH OIL + D3) 1200-1000 MG-UNIT CAPS Take 1 capsule by mouth every morning.      . gabapentin (NEURONTIN) 300 MG capsule Take 300 mg by mouth 2 (two) times daily.      . Multiple Vitamin (MULTIVITAMIN WITH MINERALS) TABS Take 1 tablet by mouth daily.      . vitamin B-12 (CYANOCOBALAMIN) 1000 MCG tablet Take 1,000 mcg by mouth daily.      . rivaroxaban (XARELTO) 20 MG TABS tablet Take 1 tablet (20 mg total) by mouth daily with supper.  30 tablet  4  . [DISCONTINUED] pravastatin (PRAVACHOL) 20 MG tablet Take 1 tablet (20 mg total) by mouth daily.  30 tablet  6   No current facility-administered medications for this visit.     Past Medical History  Diagnosis Date  . Hypertension   . Hyperlipidemia   . Hearing loss   . Atrial flutter 03-2012   . Headache(784.0)     migraines x 3 in life  . Melanoma     melanoma left forearm 2005- no chemo  . Chronic back pain   . HYPOGONADISM, MALE 10/12/2007    Followup per urology    . PROSTATE CANCER, HX OF 10/06/2008    Status post surgery approximately 2010      ROS:   All systems reviewed and negative except as noted in the HPI.   Past Surgical History  Procedure Laterality Date  . Melanoma excision  12/13/03    [place] [facility]  . Colonoscopy w/ polypectomy    . Tonsillectomy  1947  . Rotator cuff repair  2008    right; Dr [name]  . Arhtroscopy  right knee  2010  . Lumbar laminectomy/decompression microdiscectomy  08/16/2011    Procedure: LUMBAR LAMINECTOMY/DECOMPRESSION MICRODISCECTOMY;  Surgeon: [name];  Location: [facility];  Service: Orthopedics;  Laterality: N/A;  Lumbar Decompression and Foraminotomy L4-S1    . Prostatectomy  07/2008  . Back surgery  07/2006    lumbar discectomy  . Cardiac catheterization      15 years ago  . Cardioversion  05/18/2012    Procedure: CARDIOVERSION;  Surgeon: Martin M Martinique, MD;  Location: [facility];  Service: Cardiovascular;  Laterality: N/A;  on eloquis on-going  . Ablation of dysrhythmic focus  10/15/2012    Dr [name]     Family History  Problem Relation Age of Onset  . Pancreatic cancer Mother   . Esophageal cancer Neg Hx   . Stomach cancer Neg Hx   . Rectal cancer Neg Hx   . Colon cancer Brother      History   Social History  . Marital Status: Married    Spouse Name: N/A    Number of Children: 2  . Years of Education: N/A   Occupational History  . estimator for contractor    Social History Main Topics  . Smoking status: Former Smoker -- 1.00 packs/day for 20 years    Types: Cigarettes    Quit date: 08/08/1984  . Smokeless tobacco: Never Used  . Alcohol Use: 1.2 oz/week    2 Cans of beer per week     Comment: socially   2 beer or mixed drinks week  . Drug Use: No  . Sexual Activity: Not on file    Other Topics Concern  . Not on file   Social History Narrative   in [place] since '84     BP 132/87  Pulse 98  Ht 6' (1.829 m)  Wt 214 lb 6.4 oz (97.251 kg)  BMI 29.07 kg/m2  Physical Exam:  Well appearing 73 year old man,NAD HEENT: Unremarkable Neck:  7 cm JVD, no thyromegally Back:  No CVA tenderness Lungs:  Clear with no wheezes, rales, or rhonchi. HEART:  Regular rate rhythm, no murmurs, no rubs, no clicks Abd:  soft, positive bowel sounds, no organomegally, no rebound, no guarding Ext:  2 plus pulses, no edema, no cyanosis, no clubbing Skin:  No rashes no nodules Neuro:  CN II through XII intact, motor grossly intact  EKG - atrial flutter with a controlled VR   Assess/Plan:

## 2014-01-24 NOTE — Interval H&P Note (Signed)
History and Physical Interval Note:  01/24/2014 7:21 AM  [name]  has presented today for surgery, with the diagnosis of afib  The various methods of treatment have been discussed with the patient and family. After consideration of risks, benefits and other options for treatment, the patient has consented to  Procedure(s): ATRIAL FLUTTER ABLATION (N/A) as a surgical intervention .  The patient's history has been reviewed, patient examined, no change in status, stable for surgery.  I have reviewed the patient's chart and labs.  Questions were answered to the patient's satisfaction.     Norlene Duel.D.

## 2014-01-24 NOTE — Progress Notes (Signed)
Right groin with a moderate amount of bleeding noted pressure dressing applied. VSS. Asymptomatic. Will notify Dr. Lovena Le.

## 2014-01-24 NOTE — Progress Notes (Signed)
UR Completed Steward Sames Graves-Bigelow, RN,BSN [phone]  

## 2014-01-25 ENCOUNTER — Encounter (HOSPITAL_COMMUNITY): Payer: Self-pay | Admitting: *Deleted

## 2014-01-25 ENCOUNTER — Telehealth: Payer: Self-pay

## 2014-01-25 DIAGNOSIS — I4892 Unspecified atrial flutter: Secondary | ICD-10-CM

## 2014-01-25 NOTE — Progress Notes (Signed)
Client was released this am.  Transported by wife.  Verbalized understanding of discharge instructions.  Dressing site to right groin remains a level 0 and wife instructed on care and how to watch for any bleeding.

## 2014-01-25 NOTE — Discharge Summary (Signed)
ELECTROPHYSIOLOGY PROCEDURE DISCHARGE SUMMARY    Patient ID: [name],  MRN: [id number], DOB/AGE: [birth year] 73 y.o.  Admit date: 01/24/2014 Discharge date: 01/25/2014  Primary Care Physician: Martin November, MD Electrophysiologist: [name]  Primary Discharge Diagnosis:  Atrial flutter status post ablation this admission  Secondary Discharge Diagnosis:  1.  Hypertension 2.  Hyperlipidemia 3.  Chronic back pain 4.  Hx of prostate cancer  Allergies  Allergen Reactions  . Statins Other (See Comments)    Body aches, memory loss     Procedures This Admission: 1.  Electrophysiology study and radiofrequency catheter ablation of atrial flutter on 01-24-2014 by Dr [name].  See his op note for full details.  There were no early apparent complications.   Brief HPI: [name] is a 73 y.o. male with a past medical history as outlined above.  He underwent flutter ablation in 09/2012 and did well post procedure.  He was found to have recurrent atrial flutter on routine follow up.  He was referred back for further evaluation. Risks, benefits, and alternatives to ablation were reviewed with the patient who wished to proceed.  Hospital Course:  The patient was admitted and underwent ablation of atrial flutter as outlined in Dr [name] op note.  He was monitored on telemetry overnight which demonstrated sinus rhythm with sinus arrhythmia.  Groin and neck incisions were without complication.  He was examined by Dr [name] who considered him stable for discharge to home.  Wound care and activity restrictions were reviewed with the patient.   Discharge Vitals: Blood pressure 127/84, pulse 85, temperature 98.6 F (37 C), temperature source Oral, resp. rate 20, height 6' (1.829 m), weight 215 lb 14.4 oz (97.932 kg), SpO2 96.00%.   Labs:   Lab Results  Component Value Date   WBC 6.4 01/17/2014   HGB 15.4 01/17/2014   HCT 44.4 01/17/2014   MCV 90.4 01/17/2014   PLT 175.0 01/17/2014   No results found  for this basename: NA, K, CL, CO2, BUN, CREATININE, CALCIUM, LABALBU, PROT, BILITOT, ALKPHOS, ALT, AST, GLUCOSE,  in the last 168 hours  Discharge Medications:    Medication List    ASK your doctor about these medications       ALPRAZolam 0.5 MG tablet  Commonly known as:  XANAX  Take 0.5 mg by mouth at bedtime as needed for sleep.     apixaban 5 MG Tabs tablet  Commonly known as:  ELIQUIS  Take 5 mg by mouth 2 (two) times daily.     benazepril 40 MG tablet  Commonly known as:  LOTENSIN  Take 40 mg by mouth daily.     cyclobenzaprine 10 MG tablet  Commonly known as:  FLEXERIL  Take 10 mg by mouth daily as needed for muscle spasms.     diltiazem 240 MG 24 hr capsule  Commonly known as:  TIAZAC  Take 240 mg by mouth daily.     fenofibrate 160 MG tablet  Take 160 mg by mouth daily.     FISH OIL + D3 1200-1000 MG-UNIT Caps  Take 1 capsule by mouth every morning.     gabapentin 300 MG capsule  Commonly known as:  NEURONTIN  Take 300 mg by mouth 2 (two) times daily.     multivitamin with minerals Tabs tablet  Take 1 tablet by mouth daily.     vitamin B-12 1000 MCG tablet  Commonly known as:  CYANOCOBALAMIN  Take 1,000 mcg by mouth daily.  Disposition:     Duration of Discharge Encounter: less than 30 minutes including physician time.  Signed, [name].D.

## 2014-01-25 NOTE — CV Procedure (Signed)
Electrophysiology procedure note  Procedure: Electrophysiologic study and catheter ablation of atrial flutter  Indication: Sustained, symptomatic atrial flutter  Description of the procedure: After informed consent was obtained, the patient was taken to the diagnostic electrophysiology laboratory in the fasting state. After the usual preparation and draping, intravenous Versed and fentanyl were used for sedation. A 6 French hexapolar catheter was inserted percutaneously into the right jugular vein and advanced to the coronary sinus. A 6 French quadripolar catheter was inserted percutaneously into the right femoral vein, and advanced to the His bundle region. A 6 French quadripolar catheter was inserted percutaneously into the right femoral vein, and advanced to the right ventricle. Mapping of atrial flutter was then carried out. This demonstrated typical counterclockwise tricuspid annular reentry atrial flutter. A 7 French quadripolar ablation catheter was inserted percutaneously into the right femoral vein, and advanced to the right atrium. Mapping of the atrial flutter isthmus was carried out. For radiofrequency energy applications were delivered to the atrial flutter isthmus. During the first radiofrequency energy application, atrial flutter was terminated. During the third radiofrequency energy application, atrial flutter isthmus block was demonstrated. The local stimulus to atrial activation was 180 ms. A single bonus radiofrequency energy application was then delivered. At this point rapid ventricular pacing was carried out from the right ventricle demonstrating VA block at 500 ms. During rapid ventricular pacing, the atrial activation was midline. Next programmed ventricular stimulation was carried out from the ventricle. The basic drive cycle length was 600 ms. S1-S2 interval was stepwise decreased from 540 ms down to 420 ms with the retrograde AV node ERP was demonstrated. Next programmed atrial  stimulation was carried out from the atrium at a pacing cycle length of 600 ms. The S1-S2 interval was stepwise decreased from 540 ms down to 420 ms where the AV node ERP was demonstrated. Finally, rapid atrial pacing was carried out at 600 ms and stepwise decreased down to 510 ms AV block was demonstrated. The patient was observed for approximately 20 minutes. During this time there is no evidence of recurrent conduction in the atrial flutter isthmus. The catheters were then removed, and the patient was returned to the recovery area for removal of the sheaths.  Complications: There were no immediate procedural complications  Results. 1. Baseline ECG. The baseline ECG demonstrated atrial flutter with variable AV conduction. 2. Baseline intervals. The HV interval was 55 ms. The atrial flutter cycle length was 270 ms. The QRS duration 110 ms. Following the ablation, status post cycle length was 820 ms. The AH interval was 130 ms. 3. Rapid ventricular pacing. Following ablation, rapid ventricular pacing demonstrated the VA block at 500 ms. During rapid ventricular pacing, the atrial activation sequence was midline and decremental. 4. Programmed ventricular stimulation. Programmed ventricular stimulation was carried out from the right ventricle at a pacing cycle length of 600 ms. The S1-S2 interval was stepwise decreased from 540 ms down to 420 ms where the retrograde AV node ERP was demonstrated. During programmed ventricular stimulation, the atrial activation sequence was midline and decremental. 5. Programmed atrial stimulation. Following ablation, programmed atrial stimulation was carried out from the atrium at a pacing cycle length of 600 ms. The S1 and S2 interval was stepwise decreased down to 420 ms with the AV node ERP was demonstrated. During programmed atrial stimulation, there was no inducible SVT. 6. Rapid atrial pacing. Rapid atrial pacing was carried out from the atrium at a pacing cycle length  of 600 ms. The interval was stepwise  decreased down to 510 ms where AV Wenckebach was demonstrated. During rapid atrial pacing, the PR interval was less than the RR interval and there is no inducible SVT. 7. Arrhythmias observed. Atrial flutter, cycle length 270 ms. Method of termination was with catheter ablation. 8. Mapping. Mapping the patient's atrial flutter demonstrated typical counterclockwise tricuspid annular reentry. 9. Radiofrequency energy application. For radiofrequency energy applications were delivered to the atrial flutter isthmus, resulting in termination of atrial flutter, and restoration of sinus rhythm. In addition, atrial flutter isthmus block was demonstrated during the third radiofrequency energy application. A bonus radiofrequency energy application was then delivered.  Conclusion. Successful EP study and catheter ablation of typical atrial flutter with 4 radiofrequency energy applications, resulting in termination of atrial flutter, restoration of sinus rhythm, and creation of bidirectional block in the atrial flutter isthmus.  Cristopher Peru, M.D.

## 2014-01-25 NOTE — Discharge Instructions (Signed)
No driving for 3 days. No lifting over 5 lbs for 1 week. No sexual activity for 1 week. Keep procedure site clean & dry. If you notice increased pain, swelling, bleeding or pus, call/return! You may shower, but no soaking baths/hot tubs/pools for 1 week. ° °

## 2014-01-25 NOTE — Telephone Encounter (Signed)
Pt walked in.pt would like to know if he should continue Eliquis after his ablation done by Dr.Taylor on 6/815.pt adv to continue taking Eliquis as prescribed until instructed to d/c by Dr.Taylor.pt has appt sch on 02/22/14 pt made aware.

## 2014-02-22 ENCOUNTER — Ambulatory Visit (INDEPENDENT_AMBULATORY_CARE_PROVIDER_SITE_OTHER): Payer: Medicare Other | Admitting: Internal Medicine

## 2014-02-22 ENCOUNTER — Encounter: Payer: Self-pay | Admitting: Internal Medicine

## 2014-02-22 VITALS — BP 112/64 | HR 61 | Ht 72.0 in | Wt 209.0 lb

## 2014-02-22 DIAGNOSIS — I4892 Unspecified atrial flutter: Secondary | ICD-10-CM

## 2014-02-22 NOTE — Assessment & Plan Note (Signed)
He appears to be maintaining NSR several weeks after ablation. He may stop his Eliquis. I will see him back as needed.

## 2014-02-22 NOTE — Progress Notes (Signed)
HPI Mr. Eliberto Ivory returns today for followup. He is a pleasant 73 yo man with atrial flutter s/p ablation who developed recurrent flutter over a year out from his initial ablation and underwent repeat several weeks ago. 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 He has not had palpitations and denies chest pain or sob. Allergies  Allergen Reactions  . Statins Other (See Comments)    Body aches, memory loss     Current Outpatient Prescriptions  Medication Sig Dispense Refill  . ALPRAZolam (XANAX) 0.5 MG tablet Take 0.5 mg by mouth at bedtime as needed for sleep.      Marland Kitchen apixaban (ELIQUIS) 5 MG TABS tablet Take 5 mg by mouth 2 (two) times daily.      . benazepril (LOTENSIN) 40 MG tablet Take 40 mg by mouth daily.      . cyclobenzaprine (FLEXERIL) 10 MG tablet Take 10 mg by mouth daily as needed for muscle spasms.      Marland Kitchen diltiazem (TIAZAC) 240 MG 24 hr capsule Take 240 mg by mouth daily.      . fenofibrate 160 MG tablet Take 160 mg by mouth daily.      . Fish Oil-Cholecalciferol (FISH OIL + D3) 1200-1000 MG-UNIT CAPS Take 1 capsule by mouth every morning.      . gabapentin (NEURONTIN) 300 MG capsule Take 300 mg by mouth 2 (two) times daily.      . Multiple Vitamin (MULTIVITAMIN WITH MINERALS) TABS Take 1 tablet by mouth daily.      . vitamin B-12 (CYANOCOBALAMIN) 1000 MCG tablet Take 1,000 mcg by mouth daily.      . [DISCONTINUED] pravastatin (PRAVACHOL) 20 MG tablet Take 1 tablet (20 mg total) by mouth daily.  30 tablet  6   No current facility-administered medications for this visit.     Past Medical History  Diagnosis Date  . Hypertension   . Hyperlipidemia   . Hearing loss   . Atrial flutter 03-2012  . Headache(784.0)     migraines x 3 in life  . Melanoma     melanoma left forearm 2005- no chemo  . Chronic back pain   . HYPOGONADISM, MALE 10/12/2007    Followup per urology    . PROSTATE CANCER, HX OF 10/06/2008    Status  post surgery approximately 2010      ROS:   All systems reviewed and negative except as noted in the HPI.   Past Surgical History  Procedure Laterality Date  . Melanoma excision  12/13/03    [facility]  . Colonoscopy w/ polypectomy    . Tonsillectomy  1947  . Rotator cuff repair  2008    right; Dr Justice Britain  . Arhtroscopy  right knee  2010  . Lumbar laminectomy/decompression microdiscectomy  08/16/2011    Procedure: LUMBAR LAMINECTOMY/DECOMPRESSION MICRODISCECTOMY;  Surgeon: Dahlia Bailiff;  Location: [facility];  Service: Orthopedics;  Laterality: N/A;  Lumbar Decompression and Foraminotomy L4-S1    . Prostatectomy  07/2008  . Back surgery  07/2006    lumbar discectomy  . Cardiac catheterization      15 years ago  . Cardioversion  05/18/2012    Procedure: CARDIOVERSION;  Surgeon: Peter M Martinique, MD;  Location: [facility] ENDOSCOPY;  Service: Cardiovascular;  Laterality: N/A;  on eloquis on-going  . Ablation of dysrhythmic focus  10/15/2012    CTI ablation by Dr Lovena Le for atrial flutter  .  Ablation  01-24-2014    repeat CTI ablation by Dr Lovena Le     Family History  Problem Relation Age of Onset  . Pancreatic cancer Mother   . Esophageal cancer Neg Hx   . Stomach cancer Neg Hx   . Rectal cancer Neg Hx   . Colon cancer Brother      History   Social History  . Marital Status: Married    Spouse Name: N/A    Number of Children: 2  . Years of Education: N/A   Occupational History  . estimator for contractor    Social History Main Topics  . Smoking status: Former Smoker -- 1.00 packs/day for 20 years    Types: Cigarettes    Quit date: 08/08/1984  . Smokeless tobacco: Never Used  . Alcohol Use: 1.2 oz/week    2 Cans of beer per week     Comment: socially   2 beer or mixed drinks week  . Drug Use: No  . Sexual Activity: Not on file   Other Topics Concern  . Not on file   Social History Narrative   in [place] since '84     BP 112/64  Pulse 61  Ht 6' (1.829 m)   Wt 209 lb (94.802 kg)  BMI 28.34 kg/m2  Physical Exam:  Well appearing 73 yo woman, NAD HEENT: Unremarkable Neck:  No JVD, no thyromegally Back:  No CVA tenderness Lungs:  Clear with no wheezes HEART:  Regular rate rhythm, no murmurs, no rubs, no clicks Abd:  soft, positive bowel sounds, no organomegally, no rebound, no guarding Ext:  2 plus pulses, no edema, no cyanosis, no clubbing Skin:  No rashes no nodules Neuro:  CN II through XII intact, motor grossly intact  EKG - nsr  Assess/Plan:

## 2014-02-22 NOTE — Patient Instructions (Addendum)
Your physician recommends that you schedule a follow-up appointment as needed with Dr. Lovena Le  Your physician has recommended you make the following change in your medication:  Stop Eliquis

## 2014-02-25 ENCOUNTER — Other Ambulatory Visit: Payer: Self-pay | Admitting: Dermatology

## 2014-03-11 ENCOUNTER — Other Ambulatory Visit: Payer: Self-pay | Admitting: Internal Medicine

## 2014-03-14 NOTE — Telephone Encounter (Signed)
Will grant refill as Dr. Larose Kells is out of office.  Rx printed, signed and given to Williams for faxing. Further refills to come from Dr. Larose Kells.

## 2014-03-14 NOTE — Telephone Encounter (Signed)
Rx request faxed to pharmacy/[facility]  

## 2014-04-02 ENCOUNTER — Other Ambulatory Visit: Payer: Self-pay | Admitting: Internal Medicine

## 2014-04-26 ENCOUNTER — Other Ambulatory Visit: Payer: Self-pay | Admitting: Dermatology

## 2014-05-24 ENCOUNTER — Ambulatory Visit (INDEPENDENT_AMBULATORY_CARE_PROVIDER_SITE_OTHER): Payer: Medicare Other

## 2014-05-24 ENCOUNTER — Ambulatory Visit: Payer: Medicare Other

## 2014-05-24 DIAGNOSIS — Z23 Encounter for immunization: Secondary | ICD-10-CM

## 2014-05-25 ENCOUNTER — Other Ambulatory Visit: Payer: Self-pay | Admitting: Physician Assistant

## 2014-05-25 ENCOUNTER — Telehealth: Payer: Self-pay

## 2014-05-25 MED ORDER — ALPRAZOLAM 0.5 MG PO TABS: ORAL_TABLET | ORAL | Status: DC

## 2014-05-25 NOTE — Telephone Encounter (Signed)
eScribe request from [facility] for [facility] on Alprazolam Last filled - 07.27.15, #30x1 Last AEX - 05.08.15 Next AEX - 6 Mths. Please Advise on refills/SLS

## 2014-05-25 NOTE — Telephone Encounter (Signed)
Pt is requesting refill on Alprazolam.  Last OV: 01/03/2014: NP appt with Tabori on 07/06/2014 Last Fill: 03/14/2014 # 30 1 RF UDS: 01/03/2014 Moderate Risk   Please advise.

## 2014-05-25 NOTE — Telephone Encounter (Signed)
Ok RF 

## 2014-05-25 NOTE — Telephone Encounter (Signed)
Medication faxed to [facility].

## 2014-06-02 ENCOUNTER — Other Ambulatory Visit: Payer: Self-pay

## 2014-06-02 MED ORDER — BENAZEPRIL HCL 40 MG PO TABS: 40.0000 mg | ORAL_TABLET | Freq: Every day | ORAL | Status: DC

## 2014-06-27 ENCOUNTER — Other Ambulatory Visit: Payer: Self-pay | Admitting: Dermatology

## 2014-06-29 ENCOUNTER — Telehealth: Payer: Self-pay | Admitting: Physician Assistant

## 2014-06-30 NOTE — Telephone Encounter (Signed)
Pt is requesting refill on Alprazolam  Last OV: 01/03/2014, new Pt appt with Tabori on 07/06/2014 Last Fill: 05/25/2014 # 30 0RF UDS: 01/03/2014 Moderate risk  Please advise.

## 2014-06-30 NOTE — Telephone Encounter (Signed)
Faxed to [facility].

## 2014-06-30 NOTE — Telephone Encounter (Signed)
rx printed

## 2014-07-05 ENCOUNTER — Other Ambulatory Visit: Payer: Self-pay

## 2014-07-05 ENCOUNTER — Other Ambulatory Visit: Payer: Self-pay | Admitting: Internal Medicine

## 2014-07-06 ENCOUNTER — Encounter: Payer: Self-pay | Admitting: Family Medicine

## 2014-07-06 ENCOUNTER — Ambulatory Visit: Payer: Medicare Other | Admitting: Internal Medicine

## 2014-07-06 ENCOUNTER — Ambulatory Visit (INDEPENDENT_AMBULATORY_CARE_PROVIDER_SITE_OTHER): Payer: Medicare Other | Admitting: Family Medicine

## 2014-07-06 VITALS — BP 140/72 | HR 57 | Temp 98.1°F | Resp 16 | Ht 72.5 in | Wt 212.5 lb

## 2014-07-06 DIAGNOSIS — I4892 Unspecified atrial flutter: Secondary | ICD-10-CM

## 2014-07-06 DIAGNOSIS — R7303 Prediabetes: Secondary | ICD-10-CM

## 2014-07-06 DIAGNOSIS — I10 Essential (primary) hypertension: Secondary | ICD-10-CM

## 2014-07-06 DIAGNOSIS — R7309 Other abnormal glucose: Secondary | ICD-10-CM

## 2014-07-06 DIAGNOSIS — M5416 Radiculopathy, lumbar region: Secondary | ICD-10-CM

## 2014-07-06 DIAGNOSIS — E785 Hyperlipidemia, unspecified: Secondary | ICD-10-CM

## 2014-07-06 LAB — CBC WITH DIFFERENTIAL/PLATELET
Basophils Absolute: 0 10*3/uL (ref 0.0–0.1)
Basophils Relative: 0.6 % (ref 0.0–3.0)
EOS PCT: 3 % (ref 0.0–5.0)
Eosinophils Absolute: 0.2 10*3/uL (ref 0.0–0.7)
HCT: 47.8 % (ref 39.0–52.0)
HEMOGLOBIN: 15.9 g/dL (ref 13.0–17.0)
Lymphocytes Relative: 25.4 % (ref 12.0–46.0)
Lymphs Abs: 1.6 10*3/uL (ref 0.7–4.0)
MCHC: 33.4 g/dL (ref 30.0–36.0)
MCV: 91.1 fl (ref 78.0–100.0)
MONOS PCT: 7.5 % (ref 3.0–12.0)
Monocytes Absolute: 0.5 10*3/uL (ref 0.1–1.0)
Neutro Abs: 4.1 10*3/uL (ref 1.4–7.7)
Neutrophils Relative %: 63.5 % (ref 43.0–77.0)
PLATELETS: 177 10*3/uL (ref 150.0–400.0)
RBC: 5.24 Mil/uL (ref 4.22–5.81)
RDW: 13.5 % (ref 11.5–15.5)
WBC: 6.5 10*3/uL (ref 4.0–10.5)

## 2014-07-06 LAB — BASIC METABOLIC PANEL
BUN: 21 mg/dL (ref 6–23)
CALCIUM: 8.8 mg/dL (ref 8.4–10.5)
CO2: 24 mEq/L (ref 19–32)
CREATININE: 1.4 mg/dL (ref 0.4–1.5)
Chloride: 108 mEq/L (ref 96–112)
GFR: 53.62 mL/min — AB (ref >60.00)
Glucose, Bld: 107 mg/dL — ABNORMAL HIGH (ref 70–99)
Potassium: 4.1 mEq/L (ref 3.5–5.1)
Sodium: 140 mEq/L (ref 135–145)

## 2014-07-06 LAB — HEPATIC FUNCTION PANEL
ALT: 21 U/L (ref 0–53)
AST: 24 U/L (ref 0–37)
Albumin: 4.2 g/dL (ref 3.5–5.2)
Alkaline Phosphatase: 45 U/L (ref 39–117)
Bilirubin, Direct: 0 mg/dL (ref 0.0–0.3)
Total Bilirubin: 0.7 mg/dL (ref 0.2–1.2)
Total Protein: 7 g/dL (ref 6.0–8.3)

## 2014-07-06 LAB — LIPID PANEL
CHOL/HDL RATIO: 5
Cholesterol: 231 mg/dL — ABNORMAL HIGH (ref 0–200)
HDL: 45.6 mg/dL (ref >39.00)
LDL CALC: 146 mg/dL — AB (ref 0–99)
NonHDL: 185.4
TRIGLYCERIDES: 196 mg/dL — AB (ref 0.0–149.0)
VLDL: 39.2 mg/dL (ref 0.0–40.0)

## 2014-07-06 LAB — HEMOGLOBIN A1C: Hgb A1c MFr Bld: 6 % (ref 4.6–6.5)

## 2014-07-06 LAB — TSH: TSH: 1.27 u[IU]/mL (ref 0.35–4.50)

## 2014-07-06 MED ORDER — GABAPENTIN 300 MG PO CAPS: 300.0000 mg | ORAL_CAPSULE | Freq: Three times a day (TID) | ORAL | Status: DC

## 2014-07-06 NOTE — Progress Notes (Signed)
   Subjective:    Patient ID: [name], male    DOB: 07-16-1941, 73 y.o.   MRN: [id number]  HPI New to establish.  Previous MD- Paz  HTN- chronic problem, on Benazepril, Dilt.  Home BPs range from 120-140.  No CP, SOB, HAs, visual changes, edema  Hyperlipidemia- chronic problem, on Fenofibrate, fish oil.  Intolerant to statins.  Aflutter- chronic problem, s/p 2 ablations.  Following w/ Dr Lovena Le.  On Dilt.  Currently on 325mg  ASA BID.  No abnormal bleeding or bruising.  Hx of prostate cancer- following w/ Dr Risa Grill, s/p surgical resection.  Neuropathy- pt has hx of back surgery which resulted in Neuropathy.  On gabapentin.  L hip is still painful.  Had 1st surgery w/ Dr Vertell Limber and 2nd surgery w/ Dr Rolena Infante.   Review of Systems For ROS see HPI     Objective:   Physical Exam  Constitutional: He is oriented to person, place, and time. He appears well-developed and well-nourished. No distress.  HENT:  Head: Normocephalic and atraumatic.  Eyes: Conjunctivae and EOM are normal. Pupils are equal, round, and reactive to light.  Neck: Normal range of motion. Neck supple. No thyromegaly present.  Cardiovascular: Normal rate, regular rhythm, normal heart sounds and intact distal pulses.   No murmur heard. Pulmonary/Chest: Effort normal and breath sounds normal. No respiratory distress.  Abdominal: Soft. Bowel sounds are normal. He exhibits no distension.  Musculoskeletal: He exhibits no edema.  Lymphadenopathy:    He has no cervical adenopathy.  Neurological: He is alert and oriented to person, place, and time. No cranial nerve deficit.  Skin: Skin is warm and dry.  Psychiatric: He has a normal mood and affect. His behavior is normal.  Vitals reviewed.         Assessment & Plan:

## 2014-07-06 NOTE — Progress Notes (Signed)
Pre visit review using our clinic review tool, if applicable. No additional management support is needed unless otherwise documented below in the visit note. 

## 2014-07-06 NOTE — Patient Instructions (Signed)
Schedule your complete physical in 6 months We'll notify you of your lab results and make any changes if needed Increase the gabapentin to 3x/day We'll call you with your ortho appt Call with any questions or concerns Happy Holidays!!!

## 2014-07-10 NOTE — Assessment & Plan Note (Signed)
Chronic problem.  Continues to have moderate to severe sxs.  Increase gabapentin to TID and refer back to Ortho (pt preference).  Will follow.

## 2014-07-10 NOTE — Assessment & Plan Note (Signed)
Chronic problem.  Following w/ Cards.  S/p ablation.  Currently asymptomatic.  Will follow.

## 2014-07-10 NOTE — Assessment & Plan Note (Signed)
Chronic problem.  Adequate control.  Asymptomatic.  Check labs.  No anticipated med changes 

## 2014-07-10 NOTE — Assessment & Plan Note (Signed)
Hx of this.  Check labs to determine if A1C has progressed to DM.

## 2014-07-10 NOTE — Assessment & Plan Note (Signed)
Chronic problem.  Hx of statin intolerance.  On Fenofibrate.  Check labs.  Continue to encourage healthy diet and regular exercise.

## 2014-07-25 ENCOUNTER — Other Ambulatory Visit: Payer: Self-pay | Admitting: Internal Medicine

## 2014-07-25 NOTE — Telephone Encounter (Signed)
Med filled.  

## 2014-07-28 ENCOUNTER — Encounter (HOSPITAL_COMMUNITY): Payer: Self-pay | Admitting: Internal Medicine

## 2014-08-02 ENCOUNTER — Other Ambulatory Visit: Payer: Self-pay | Admitting: Family Medicine

## 2014-08-02 NOTE — Telephone Encounter (Signed)
Last OV 07-06-14 Alprazolam last filled 06-30-14 #30 with 0

## 2014-08-02 NOTE — Telephone Encounter (Signed)
Med filled and faxed.  

## 2014-08-06 ENCOUNTER — Other Ambulatory Visit: Payer: Self-pay | Admitting: Internal Medicine

## 2014-08-08 NOTE — Telephone Encounter (Signed)
Med filled.  

## 2014-08-28 ENCOUNTER — Other Ambulatory Visit: Payer: Self-pay | Admitting: Internal Medicine

## 2014-08-29 NOTE — Telephone Encounter (Signed)
Med filled.  

## 2014-08-30 ENCOUNTER — Other Ambulatory Visit: Payer: Self-pay | Admitting: General Practice

## 2014-08-30 ENCOUNTER — Other Ambulatory Visit: Payer: Self-pay | Admitting: Dermatology

## 2014-08-30 DIAGNOSIS — L570 Actinic keratosis: Secondary | ICD-10-CM | POA: Diagnosis not present

## 2014-08-30 DIAGNOSIS — L820 Inflamed seborrheic keratosis: Secondary | ICD-10-CM | POA: Diagnosis not present

## 2014-08-30 DIAGNOSIS — Z85828 Personal history of other malignant neoplasm of skin: Secondary | ICD-10-CM | POA: Diagnosis not present

## 2014-08-30 DIAGNOSIS — Z85820 Personal history of malignant melanoma of skin: Secondary | ICD-10-CM | POA: Diagnosis not present

## 2014-08-30 DIAGNOSIS — L821 Other seborrheic keratosis: Secondary | ICD-10-CM | POA: Diagnosis not present

## 2014-08-30 MED ORDER — GABAPENTIN 300 MG PO CAPS: 300.0000 mg | ORAL_CAPSULE | Freq: Three times a day (TID) | ORAL | Status: DC

## 2014-08-30 MED ORDER — BENAZEPRIL HCL 40 MG PO TABS: ORAL_TABLET | ORAL | Status: DC

## 2014-08-30 MED ORDER — CYCLOBENZAPRINE HCL 10 MG PO TABS: 10.0000 mg | ORAL_TABLET | Freq: Every day | ORAL | Status: DC | PRN

## 2014-08-30 MED ORDER — ALPRAZOLAM 0.5 MG PO TABS: ORAL_TABLET | ORAL | Status: DC

## 2014-08-30 MED ORDER — FENOFIBRATE 160 MG PO TABS: 160.0000 mg | ORAL_TABLET | Freq: Every day | ORAL | Status: DC

## 2014-08-30 NOTE — Telephone Encounter (Signed)
Pt wants medications filled to [facility]. Please advise on the :  flexeril 12/10/13 #30 with 3 And  Alprazolam last filled 08/02/14 #30 with 3  Pt was last seen in the office on 07/06/14

## 2014-09-01 ENCOUNTER — Other Ambulatory Visit: Payer: Self-pay | Admitting: General Practice

## 2014-09-01 MED ORDER — ALPRAZOLAM 0.5 MG PO TABS: ORAL_TABLET | ORAL | Status: DC

## 2014-10-11 ENCOUNTER — Other Ambulatory Visit: Payer: Self-pay | Admitting: Family Medicine

## 2014-10-11 ENCOUNTER — Other Ambulatory Visit: Payer: Self-pay | Admitting: General Practice

## 2014-10-11 ENCOUNTER — Encounter: Payer: Self-pay | Admitting: Family Medicine

## 2014-10-11 MED ORDER — ALPRAZOLAM 0.5 MG PO TABS: ORAL_TABLET | ORAL | Status: DC

## 2014-10-11 MED ORDER — BENAZEPRIL HCL 40 MG PO TABS: ORAL_TABLET | ORAL | Status: DC

## 2014-10-11 MED ORDER — TIZANIDINE HCL 4 MG PO TABS: 4.0000 mg | ORAL_TABLET | Freq: Four times a day (QID) | ORAL | Status: DC | PRN

## 2014-10-11 MED ORDER — GABAPENTIN 300 MG PO CAPS: 300.0000 mg | ORAL_CAPSULE | Freq: Three times a day (TID) | ORAL | Status: DC

## 2014-10-11 MED ORDER — FENOFIBRATE 160 MG PO TABS: 160.0000 mg | ORAL_TABLET | Freq: Every day | ORAL | Status: DC

## 2014-10-11 NOTE — Telephone Encounter (Signed)
meds filled

## 2014-10-12 ENCOUNTER — Encounter: Payer: Self-pay | Admitting: Family Medicine

## 2014-10-12 NOTE — Telephone Encounter (Signed)
Pt's insurance is no longer covering his Dilt.  They are recommending either Metoprolol or Verapamil.  Verapamil can interact w/ his Zanaflex but this is not likely a contraindication.  Will forward this onto his Cardiologist for input and assistance.

## 2014-10-14 ENCOUNTER — Other Ambulatory Visit: Payer: Self-pay | Admitting: *Deleted

## 2014-10-14 NOTE — Telephone Encounter (Signed)
Medications previously filled- no new Rx sent.

## 2014-10-18 MED ORDER — VERAPAMIL HCL ER 240 MG PO CP24: 240.0000 mg | ORAL_CAPSULE | Freq: Every day | ORAL | Status: DC

## 2014-10-19 ENCOUNTER — Telehealth: Payer: Self-pay | Admitting: Family Medicine

## 2014-10-19 MED ORDER — VERAPAMIL HCL ER 240 MG PO CP24: 240.0000 mg | ORAL_CAPSULE | Freq: Every day | ORAL | Status: DC

## 2014-10-19 NOTE — Telephone Encounter (Signed)
Caller name: Erron Relation to pt: self Call back number: [phone] Pharmacy:  Reason for call:   Patient states [facility]

## 2014-10-19 NOTE — Telephone Encounter (Signed)
Med filled.  

## 2014-10-25 ENCOUNTER — Telehealth: Payer: Self-pay | Admitting: General Practice

## 2014-10-25 MED ORDER — VERAPAMIL HCL ER 200 MG PO CP24: 1.0000 | ORAL_CAPSULE | Freq: Every day | ORAL | Status: DC

## 2014-10-25 NOTE — Telephone Encounter (Signed)
New rx sent to pharmacy

## 2014-10-25 NOTE — Telephone Encounter (Signed)
200 mg daily

## 2014-10-25 NOTE — Telephone Encounter (Signed)
Received a fax from [facility] stating that they need clarification for pt's Verelan PM. Per our records pt is at 240mg . [facility] states that they only have 100mg , 200mg , and 300mg  available. Please advise. Papers placed in your red folder.

## 2014-10-27 ENCOUNTER — Telehealth: Payer: Self-pay | Admitting: Family Medicine

## 2014-10-27 MED ORDER — VERAPAMIL HCL ER 240 MG PO TBCR
240.0000 mg | EXTENDED_RELEASE_TABLET | Freq: Every day | ORAL | Status: DC
Start: 2014-10-27 — End: 2015-04-25

## 2014-10-27 NOTE — Telephone Encounter (Signed)
Med filled.  

## 2014-10-27 NOTE — Telephone Encounter (Signed)
Caller name: Sally Relation to pt: self Call back number: [phone] Pharmacy[facility]  Reason for call:   Changed to veramapil. Patient states that this needs to be 240mg  tab CR because insurance will cover at 100%. Patient states that 200mg  capsule was called in and cost him $ 120.11

## 2014-11-03 ENCOUNTER — Other Ambulatory Visit: Payer: Self-pay | Admitting: Dermatology

## 2014-11-03 DIAGNOSIS — L570 Actinic keratosis: Secondary | ICD-10-CM | POA: Diagnosis not present

## 2014-11-03 DIAGNOSIS — C44212 Basal cell carcinoma of skin of right ear and external auricular canal: Secondary | ICD-10-CM | POA: Diagnosis not present

## 2014-11-03 DIAGNOSIS — L821 Other seborrheic keratosis: Secondary | ICD-10-CM | POA: Diagnosis not present

## 2014-11-24 DIAGNOSIS — C44212 Basal cell carcinoma of skin of right ear and external auricular canal: Secondary | ICD-10-CM | POA: Diagnosis not present

## 2014-12-15 ENCOUNTER — Telehealth: Payer: Self-pay | Admitting: Family Medicine

## 2014-12-15 NOTE — Telephone Encounter (Signed)
Pre visit letter for annual physical mailed

## 2015-01-04 ENCOUNTER — Telehealth: Payer: Self-pay | Admitting: *Deleted

## 2015-01-04 NOTE — Telephone Encounter (Signed)
Unable to reach patient at time of Pre-Visit Call.  Left message for patient to return call when available.    

## 2015-01-05 ENCOUNTER — Ambulatory Visit (INDEPENDENT_AMBULATORY_CARE_PROVIDER_SITE_OTHER): Payer: Medicare Other | Admitting: Family Medicine

## 2015-01-05 ENCOUNTER — Encounter: Payer: Self-pay | Admitting: Family Medicine

## 2015-01-05 ENCOUNTER — Telehealth: Payer: Self-pay | Admitting: Family Medicine

## 2015-01-05 VITALS — BP 126/80 | HR 60 | Temp 97.9°F | Resp 16 | Ht 71.0 in | Wt 215.0 lb

## 2015-01-05 DIAGNOSIS — Z0000 Encounter for general adult medical examination without abnormal findings: Secondary | ICD-10-CM

## 2015-01-05 DIAGNOSIS — I10 Essential (primary) hypertension: Secondary | ICD-10-CM

## 2015-01-05 DIAGNOSIS — E785 Hyperlipidemia, unspecified: Secondary | ICD-10-CM

## 2015-01-05 DIAGNOSIS — Z8546 Personal history of malignant neoplasm of prostate: Secondary | ICD-10-CM | POA: Diagnosis not present

## 2015-01-05 DIAGNOSIS — R7303 Prediabetes: Secondary | ICD-10-CM

## 2015-01-05 DIAGNOSIS — R7309 Other abnormal glucose: Secondary | ICD-10-CM | POA: Diagnosis not present

## 2015-01-05 LAB — HEPATIC FUNCTION PANEL
ALT: 17 U/L (ref 0–53)
AST: 21 U/L (ref 0–37)
Albumin: 3.8 g/dL (ref 3.5–5.2)
Alkaline Phosphatase: 45 U/L (ref 39–117)
BILIRUBIN DIRECT: 0 mg/dL (ref 0.0–0.3)
BILIRUBIN TOTAL: 0.6 mg/dL (ref 0.2–1.2)
Total Protein: 6.7 g/dL (ref 6.0–8.3)

## 2015-01-05 LAB — BASIC METABOLIC PANEL
BUN: 22 mg/dL (ref 6–23)
CHLORIDE: 105 meq/L (ref 96–112)
CO2: 29 mEq/L (ref 19–32)
Calcium: 9 mg/dL (ref 8.4–10.5)
Creatinine, Ser: 1.36 mg/dL (ref 0.40–1.50)
GFR: 54.46 mL/min — AB (ref >60.00)
GLUCOSE: 109 mg/dL — AB (ref 70–99)
Potassium: 4 mEq/L (ref 3.5–5.1)
Sodium: 139 mEq/L (ref 135–145)

## 2015-01-05 LAB — LIPID PANEL
CHOL/HDL RATIO: 4
Cholesterol: 185 mg/dL (ref 0–200)
HDL: 44.7 mg/dL (ref >39.00)
LDL Cholesterol: 118 mg/dL — ABNORMAL HIGH (ref 0–99)
NonHDL: 140.3
Triglycerides: 113 mg/dL (ref 0.0–149.0)
VLDL: 22.6 mg/dL (ref 0.0–40.0)

## 2015-01-05 LAB — CBC WITH DIFFERENTIAL/PLATELET
BASOS PCT: 0.7 % (ref 0.0–3.0)
Basophils Absolute: 0 10*3/uL (ref 0.0–0.1)
EOS PCT: 2.7 % (ref 0.0–5.0)
Eosinophils Absolute: 0.2 10*3/uL (ref 0.0–0.7)
HCT: 43.7 % (ref 39.0–52.0)
Hemoglobin: 14.9 g/dL (ref 13.0–17.0)
Lymphocytes Relative: 24.9 % (ref 12.0–46.0)
Lymphs Abs: 1.5 10*3/uL (ref 0.7–4.0)
MCHC: 34.1 g/dL (ref 30.0–36.0)
MCV: 88.6 fl (ref 78.0–100.0)
MONO ABS: 0.5 10*3/uL (ref 0.1–1.0)
Monocytes Relative: 8.8 % (ref 3.0–12.0)
NEUTROS ABS: 3.8 10*3/uL (ref 1.4–7.7)
NEUTROS PCT: 62.9 % (ref 43.0–77.0)
Platelets: 193 10*3/uL (ref 150.0–400.0)
RBC: 4.94 Mil/uL (ref 4.22–5.81)
RDW: 13.6 % (ref 11.5–15.5)
WBC: 6 10*3/uL (ref 4.0–10.5)

## 2015-01-05 LAB — TSH: TSH: 1.64 u[IU]/mL (ref 0.35–4.50)

## 2015-01-05 LAB — HEMOGLOBIN A1C: HEMOGLOBIN A1C: 5.9 % (ref 4.6–6.5)

## 2015-01-05 MED ORDER — ALPRAZOLAM 0.5 MG PO TABS: ORAL_TABLET | ORAL | Status: DC

## 2015-01-05 NOTE — Progress Notes (Signed)
   Subjective:    Patient ID: [name], male    DOB: Aug 10, 1941, 74 y.o.   MRN: [id number]  HPI Here today for CPE.  Risk Factors: HTN- chronic problem, on Verapamil, Benazepril.  Denies CP, SOB, HAs, visual changes, edema. Hyperlipidemia- chronic problem, on Fenofibrate.  Is statin intolerant.  Denies abd pain, N/V, myalgias Pre-diabetes- attempting to control w/ healthy diet but no regular exercise.  Physical Activity: not exercising regularly Fall Risk: low Depression: denies current sxs Hearing: decreased to conversational tones and whispered voice ADL's: independent Cognitive: normal linear thought process, memory and attention intact Home Safety: safe at home, lives w/ wife Height, Weight, BMI, Visual Acuity: see vitals, vision corrected to 20/20 w/ glasses Counseling: UTD on colonoscopy 2014 Dr Fuller Plan (due 2019).  UTD on urology- Dr Leonard Schwartz Ordered: See A&P Care Plan: See A&P    Review of Systems Patient reports no vision/hearing changes, anorexia, fever ,adenopathy, persistant/recurrent hoarseness, swallowing issues, chest pain, palpitations, edema, persistant/recurrent cough, hemoptysis, dyspnea (rest,exertional, paroxysmal nocturnal), gastrointestinal  bleeding (melena, rectal bleeding), abdominal pain, excessive heart burn, GU symptoms (dysuria, hematuria, voiding/incontinence issues) syncope, focal weakness, memory loss, numbness & tingling, skin/hair/nail changes, depression, anxiety, abnormal bruising/bleeding, musculoskeletal symptoms/signs.   + constipation    Objective:   Physical Exam General Appearance:    Alert, cooperative, no distress, appears stated age  Head:    Normocephalic, without obvious abnormality, atraumatic  Eyes:    PERRL, conjunctiva/corneas clear, EOM's intact, fundi    benign, both eyes       Ears:    Normal TM's and external ear canals, both ears  Nose:   Nares normal, septum midline, mucosa normal, no drainage   or sinus tenderness    Throat:   Lips, mucosa, and tongue normal; teeth and gums normal  Neck:   Supple, symmetrical, trachea midline, no adenopathy;       thyroid:  No enlargement/tenderness/nodules  Back:     Symmetric, no curvature, ROM normal, no CVA tenderness  Lungs:     Clear to auscultation bilaterally, respirations unlabored  Chest wall:    No tenderness or deformity  Heart:    Regular rate and rhythm, S1 and S2 normal, no murmur, rub   or gallop  Abdomen:     Soft, non-tender, bowel sounds active all four quadrants,    no masses, no organomegaly  Genitalia:    Normal male without lesion, masses,discharge or tenderness  Rectal:    Deferred due to young age  Extremities:   Extremities normal, atraumatic, no cyanosis or edema  Pulses:   2+ and symmetric all extremities  Skin:   Skin color, texture, turgor normal, no rashes or lesions  Lymph nodes:   Cervical, supraclavicular, and axillary nodes normal  Neurologic:   CNII-XII intact. Normal strength, sensation and reflexes      throughout          Assessment & Plan:

## 2015-01-05 NOTE — Patient Instructions (Signed)
Follow up in 6 months to recheck cholesterol and BP We'll notify you of your lab results and make any changes if needed Keep up the good work on healthy diet, try and get regular activity Please have Dr Risa Grill send me copies of his notes You are up to date on all immunizations and colonoscopy is not due until 2019 Call with any questions or concerns Have a great summer! Happy Early Rudene Anda!!!

## 2015-01-05 NOTE — Telephone Encounter (Signed)
Updated Vitamin C, Removed Tizanadine and Fish oil. Aspirin was written correctly (updated to reflect on in the am and 1 in the pm). Called pt and LMOVM to return call.

## 2015-01-05 NOTE — Progress Notes (Signed)
Pre visit review using our clinic review tool, if applicable. No additional management support is needed unless otherwise documented below in the visit note. 

## 2015-01-05 NOTE — Telephone Encounter (Signed)
Relation to pt: self  Call back number: [phone]   Reason for call:   Pt when checking out wanted to inform MD about med list stating that:  Aspirin take by mouth 2 (two) times daily should be 1 (two) times daily one in the morning and one in the evening.   Fish Oil would like to discuss with medical assistant   Tizanidine 4MG  tablet does not know why he is taking this medication   Vitamin C 500 MG tablet should state take 2 chewables in the morning  Pt would like to discuss, please advise

## 2015-01-11 DIAGNOSIS — C4441 Basal cell carcinoma of skin of scalp and neck: Secondary | ICD-10-CM | POA: Diagnosis not present

## 2015-01-12 DIAGNOSIS — H2513 Age-related nuclear cataract, bilateral: Secondary | ICD-10-CM | POA: Diagnosis not present

## 2015-01-12 DIAGNOSIS — H524 Presbyopia: Secondary | ICD-10-CM | POA: Diagnosis not present

## 2015-01-13 ENCOUNTER — Ambulatory Visit (INDEPENDENT_AMBULATORY_CARE_PROVIDER_SITE_OTHER): Payer: Medicare Other | Admitting: Family Medicine

## 2015-01-13 ENCOUNTER — Encounter: Payer: Self-pay | Admitting: Family Medicine

## 2015-01-13 VITALS — BP 138/78 | HR 64 | Temp 98.6°F | Resp 16 | Wt 218.4 lb

## 2015-01-13 DIAGNOSIS — M79642 Pain in left hand: Secondary | ICD-10-CM

## 2015-01-13 DIAGNOSIS — M79641 Pain in right hand: Secondary | ICD-10-CM | POA: Diagnosis not present

## 2015-01-13 MED ORDER — CELECOXIB 100 MG PO CAPS: 100.0000 mg | ORAL_CAPSULE | Freq: Two times a day (BID) | ORAL | Status: DC

## 2015-01-13 NOTE — Progress Notes (Signed)
   Subjective:    Patient ID: [name], male    DOB: 10/17/1940, 74 y.o.   MRN: [id number]  HPI Joint pain- bilateral hand pain and swelling.  Pt reports swelling for the last few weeks.  Pain and swelling are worse in the AM and it improves as the day goes on.  Pt noted pain in R palm when he shook hands w/ someone at church.  Pt's mother had RA.   Review of Systems For ROS see HPI     Objective:   Physical Exam  Constitutional: He is oriented to person, place, and time. He appears well-developed and well-nourished. No distress.  HENT:  Head: Normocephalic and atraumatic.  Cardiovascular: Intact distal pulses.   Musculoskeletal: He exhibits edema (over PIP joints bilaterally w/o obvious deformity) and tenderness (over PIP and MCP joints bilaterally).  Neurological: He is alert and oriented to person, place, and time. He has normal reflexes. Coordination normal.  Skin: Skin is warm and dry. No erythema.  Psychiatric: He has a normal mood and affect. His behavior is normal. Thought content normal.  Vitals reviewed.         Assessment & Plan:

## 2015-01-13 NOTE — Progress Notes (Signed)
Pre visit review using our clinic review tool, if applicable. No additional management support is needed unless otherwise documented below in the visit note. 

## 2015-01-13 NOTE — Patient Instructions (Signed)
Follow up as needed We'll notify you of your lab results and make any changes if needed Start the Celebrex once daily- take w/ food.  If pain is not well controlled, you can take a 2nd dose in the evening If no improvement in pain in the next 2 weeks, call or MyChart so we can send you for additional workup Call with any questions or concerns [place]!!!

## 2015-01-14 LAB — SEDIMENTATION RATE: Sed Rate: 1 mm/hr (ref 0–20)

## 2015-01-14 LAB — RHEUMATOID FACTOR: Rhuematoid fact SerPl-aCnc: <10 IU/mL (ref <=14)

## 2015-01-16 NOTE — Assessment & Plan Note (Signed)
New.  Given family hx of RA and bilateral sxs w/ pain worse in the AM and improving as day goes on, will get labs to r/o rheumatologic process although hands don't show obvious deformity.  Start Celebrex BID.  Reviewed supportive care and red flags that should prompt return.  Pt expressed understanding and is in agreement w/ plan.

## 2015-01-17 LAB — ANA: ANA: NEGATIVE

## 2015-01-17 NOTE — Assessment & Plan Note (Signed)
Chronic problem.  Adequate control.  Asymptomatic.  Check labs.  No anticipated med changes 

## 2015-01-17 NOTE — Assessment & Plan Note (Signed)
Following w/ Dr Risa Grill, UTD on appts.

## 2015-01-17 NOTE — Assessment & Plan Note (Signed)
Ongoing issue for pt.  Attempting to control w/ healthy diet, encouraged increased and regular exercise.  Check labs.  Adjust tx plan prn.

## 2015-01-17 NOTE — Assessment & Plan Note (Signed)
Pt's PE WNL.  UTD on colonoscopy and Urology.  Written screening schedule updated and given to pt.  UTD on immunizations.  Check labs.  Anticipatory guidance provided.

## 2015-01-17 NOTE — Assessment & Plan Note (Signed)
Chronic problem.  Pt intolerant to statin.  Attempting to control w/ healthy diet.  Encouraged regular exercise.  Tolerating fenofibrate w/o difficulty.  Check labs.  Adjust meds prn

## 2015-01-27 DIAGNOSIS — H2511 Age-related nuclear cataract, right eye: Secondary | ICD-10-CM | POA: Diagnosis not present

## 2015-03-06 DIAGNOSIS — H25811 Combined forms of age-related cataract, right eye: Secondary | ICD-10-CM | POA: Diagnosis not present

## 2015-03-06 DIAGNOSIS — H2511 Age-related nuclear cataract, right eye: Secondary | ICD-10-CM | POA: Diagnosis not present

## 2015-03-08 ENCOUNTER — Encounter: Payer: Self-pay | Admitting: Family Medicine

## 2015-03-08 ENCOUNTER — Ambulatory Visit (INDEPENDENT_AMBULATORY_CARE_PROVIDER_SITE_OTHER): Payer: Medicare Other | Admitting: Family Medicine

## 2015-03-08 VITALS — BP 130/80 | HR 65 | Temp 98.0°F | Resp 16 | Wt 215.4 lb

## 2015-03-08 DIAGNOSIS — M25511 Pain in right shoulder: Secondary | ICD-10-CM

## 2015-03-08 DIAGNOSIS — M25512 Pain in left shoulder: Secondary | ICD-10-CM

## 2015-03-08 MED ORDER — PREDNISONE 10 MG PO TABS: ORAL_TABLET | ORAL | Status: DC

## 2015-03-08 NOTE — Progress Notes (Signed)
Pre visit review using our clinic review tool, if applicable. No additional management support is needed unless otherwise documented below in the visit note. 

## 2015-03-08 NOTE — Patient Instructions (Signed)
Follow up as scheduled Start the Prednisone as directed- take w/ food Ice the shoulder for pain relief We'll call you with your orthopedic appt Call with any questions or concerns Enjoy the rest of your summer!!!

## 2015-03-08 NOTE — Progress Notes (Signed)
   Subjective:    Patient ID: [name], male    DOB: 04/06/1941, 74 y.o.   MRN: [id number]  HPI Bilateral shoulder pain- L>R, getting worse.  Pain w/ overhead motion.  In the AM, pt has to use R arm to lift L arm.  + weakness w/ initiation of movement.   Review of Systems For ROS see HPI     Objective:   Physical Exam  Constitutional: He is oriented to person, place, and time. He appears well-developed and well-nourished. No distress.  HENT:  Head: Normocephalic and atraumatic.  Cardiovascular: Intact distal pulses.   Musculoskeletal: He exhibits no edema or tenderness.  Pain w/ forward flexion and abduction of L shoulder Pain w/ overhead motion on L>R  Neurological: He is alert and oriented to person, place, and time. He has normal reflexes.  Skin: Skin is warm and dry.  Vitals reviewed.         Assessment & Plan:

## 2015-03-11 ENCOUNTER — Encounter: Payer: Self-pay | Admitting: Family Medicine

## 2015-03-12 NOTE — Assessment & Plan Note (Addendum)
New.  L>R.  Suspect rotator cuff pathology due to inability to initiate forward flexion in the AM and pain w/ overhead motion.  Start prednisone for pain/inflammation.  Refer to ortho for complete evaluation and tx.

## 2015-03-13 ENCOUNTER — Other Ambulatory Visit: Payer: Self-pay

## 2015-03-16 ENCOUNTER — Telehealth: Payer: Self-pay | Admitting: Family Medicine

## 2015-03-16 NOTE — Telephone Encounter (Signed)
pt call in, tetanus & tdap, sched with nurse 03/22/15, please enter orders

## 2015-03-20 ENCOUNTER — Telehealth: Payer: Self-pay | Admitting: Family Medicine

## 2015-03-20 MED ORDER — TRAMADOL HCL 50 MG PO TABS: ORAL_TABLET | ORAL | Status: DC

## 2015-03-20 NOTE — Telephone Encounter (Signed)
Please advise, pt was seen on 03/08/15 and was placed on pred taper. Ok to refill?

## 2015-03-20 NOTE — Telephone Encounter (Signed)
Relation to KC:MKLK  Call back number: [phone]  Pharmacy: [id number] - , [address]  Reason for call:  Patient requesting a refill of prednisone (DELTASONE) 10 MG tablet due to shoulder pain.  Patient orthopedic appointment 03/29/15.

## 2015-03-20 NOTE — Telephone Encounter (Signed)
Should not be on another taper this soon-- needs ortho appointment

## 2015-03-20 NOTE — Telephone Encounter (Signed)
Called and informed pt, advised he has an ortho appt on 03/29/15. Can we prescribe anything to help with the shoulder pain until he can be evaluated?

## 2015-03-20 NOTE — Telephone Encounter (Signed)
Ultram 50 mg 1-2 po q6  h prn pain  #30  Call if it does not work

## 2015-03-22 ENCOUNTER — Ambulatory Visit (INDEPENDENT_AMBULATORY_CARE_PROVIDER_SITE_OTHER): Payer: Medicare Other | Admitting: *Deleted

## 2015-03-22 DIAGNOSIS — Z23 Encounter for immunization: Secondary | ICD-10-CM

## 2015-03-22 MED ORDER — PREDNISONE 10 MG PO TABS: ORAL_TABLET | ORAL | Status: DC

## 2015-03-22 NOTE — Progress Notes (Signed)
Pre visit review using our clinic review tool, if applicable. No additional management support is needed unless otherwise documented below in the visit note.  Patient tolerated injection well.  Patient requested refill for Prednisone. Ok per Dr. Birdie Riddle.

## 2015-03-28 DIAGNOSIS — H2512 Age-related nuclear cataract, left eye: Secondary | ICD-10-CM | POA: Diagnosis not present

## 2015-03-29 DIAGNOSIS — M7541 Impingement syndrome of right shoulder: Secondary | ICD-10-CM | POA: Diagnosis not present

## 2015-03-29 DIAGNOSIS — M7542 Impingement syndrome of left shoulder: Secondary | ICD-10-CM | POA: Diagnosis not present

## 2015-04-25 ENCOUNTER — Encounter: Payer: Self-pay | Admitting: Family Medicine

## 2015-04-25 MED ORDER — FENOFIBRATE 160 MG PO TABS: 160.0000 mg | ORAL_TABLET | Freq: Every day | ORAL | Status: DC

## 2015-04-25 MED ORDER — VERAPAMIL HCL ER 240 MG PO TBCR: 240.0000 mg | EXTENDED_RELEASE_TABLET | Freq: Every day | ORAL | Status: DC

## 2015-04-25 MED ORDER — ALPRAZOLAM 0.5 MG PO TABS: ORAL_TABLET | ORAL | Status: DC

## 2015-04-25 MED ORDER — BENAZEPRIL HCL 40 MG PO TABS: ORAL_TABLET | ORAL | Status: DC

## 2015-04-25 NOTE — Telephone Encounter (Signed)
Medication filled to pharmacy as requested.   

## 2015-04-26 DIAGNOSIS — H2512 Age-related nuclear cataract, left eye: Secondary | ICD-10-CM | POA: Diagnosis not present

## 2015-04-26 DIAGNOSIS — H25812 Combined forms of age-related cataract, left eye: Secondary | ICD-10-CM | POA: Diagnosis not present

## 2015-05-04 ENCOUNTER — Other Ambulatory Visit: Payer: Self-pay | Admitting: General Practice

## 2015-05-04 MED ORDER — ALPRAZOLAM 0.5 MG PO TABS: ORAL_TABLET | ORAL | Status: DC

## 2015-05-11 DIAGNOSIS — L570 Actinic keratosis: Secondary | ICD-10-CM | POA: Diagnosis not present

## 2015-05-11 DIAGNOSIS — L821 Other seborrheic keratosis: Secondary | ICD-10-CM | POA: Diagnosis not present

## 2015-05-11 DIAGNOSIS — L812 Freckles: Secondary | ICD-10-CM | POA: Diagnosis not present

## 2015-05-11 DIAGNOSIS — Z85828 Personal history of other malignant neoplasm of skin: Secondary | ICD-10-CM | POA: Diagnosis not present

## 2015-05-11 DIAGNOSIS — Z85820 Personal history of malignant melanoma of skin: Secondary | ICD-10-CM | POA: Diagnosis not present

## 2015-05-11 DIAGNOSIS — L814 Other melanin hyperpigmentation: Secondary | ICD-10-CM | POA: Diagnosis not present

## 2015-05-18 ENCOUNTER — Ambulatory Visit (HOSPITAL_BASED_OUTPATIENT_CLINIC_OR_DEPARTMENT_OTHER)
Admission: RE | Admit: 2015-05-18 | Discharge: 2015-05-18 | Disposition: A | Discharge status: Home / Self Care | Payer: Medicare Other | Source: Ambulatory Visit | Attending: Family Medicine | Admitting: Family Medicine

## 2015-05-18 ENCOUNTER — Ambulatory Visit (INDEPENDENT_AMBULATORY_CARE_PROVIDER_SITE_OTHER): Payer: Medicare Other | Admitting: Family Medicine

## 2015-05-18 ENCOUNTER — Encounter: Payer: Self-pay | Admitting: Family Medicine

## 2015-05-18 VITALS — BP 126/80 | HR 80 | Temp 98.1°F | Ht 71.0 in | Wt 215.0 lb

## 2015-05-18 DIAGNOSIS — R8299 Other abnormal findings in urine: Secondary | ICD-10-CM | POA: Diagnosis not present

## 2015-05-18 DIAGNOSIS — M545 Low back pain: Secondary | ICD-10-CM | POA: Diagnosis not present

## 2015-05-18 DIAGNOSIS — M898X8 Other specified disorders of bone, other site: Secondary | ICD-10-CM | POA: Insufficient documentation

## 2015-05-18 DIAGNOSIS — I10 Essential (primary) hypertension: Secondary | ICD-10-CM | POA: Diagnosis not present

## 2015-05-18 DIAGNOSIS — Z23 Encounter for immunization: Secondary | ICD-10-CM

## 2015-05-18 DIAGNOSIS — R82998 Other abnormal findings in urine: Secondary | ICD-10-CM

## 2015-05-18 DIAGNOSIS — M47816 Spondylosis without myelopathy or radiculopathy, lumbar region: Secondary | ICD-10-CM | POA: Diagnosis not present

## 2015-05-18 LAB — POCT URINALYSIS DIPSTICK
BILIRUBIN UA: NEGATIVE
Glucose, UA: NEGATIVE
Ketones, UA: POSITIVE
Leukocytes, UA: NEGATIVE
NITRITE UA: NEGATIVE
Protein, UA: POSITIVE
RBC UA: NEGATIVE
UROBILINOGEN UA: NEGATIVE
pH, UA: 5.5

## 2015-05-18 NOTE — Progress Notes (Signed)
Patient ID: [name], male   DOB: 10-27-1940, 74 y.o.   MRN: [id number]   Subjective:    Patient ID: [name], male    DOB: 1941-04-28, 74 y.o.   MRN: [id number]  Chief Complaint  Patient presents with  . Back Pain    dark urine,     HPI Patient is in today for c/o low back pain, no known injury but pt has hx previous back surgery.    Past Medical History  Diagnosis Date  . Hypertension   . Hyperlipidemia   . Hearing loss   . Atrial flutter 03-2012  . Headache(784.0)     migraines x 3 in life  . Melanoma     melanoma left forearm 2005- no chemo  . Chronic back pain   . HYPOGONADISM, MALE 10/12/2007    Followup per urology    . PROSTATE CANCER, HX OF 10/06/2008    Status post surgery approximately 2010      Past Surgical History  Procedure Laterality Date  . Melanoma excision  12/13/03    [place] [facility]  . Colonoscopy w/ polypectomy    . Tonsillectomy  1947  . Rotator cuff repair  2008    right; Dr Justice Britain  . Arhtroscopy  right knee  2010  . Lumbar laminectomy/decompression microdiscectomy  08/16/2011    Procedure: LUMBAR LAMINECTOMY/DECOMPRESSION MICRODISCECTOMY;  Surgeon: Dahlia Bailiff;  Location: [facility];  Service: Orthopedics;  Laterality: N/A;  Lumbar Decompression and Foraminotomy L4-S1    . Prostatectomy  07/2008  . Back surgery  07/2006    lumbar discectomy  . Cardiac catheterization      15 years ago  . Cardioversion  05/18/2012    Procedure: CARDIOVERSION;  Surgeon: Peter M Martinique, MD;  Location: [facility] ENDOSCOPY;  Service: Cardiovascular;  Laterality: N/A;  on eloquis on-going  . Ablation of dysrhythmic focus  10/15/2012    CTI ablation by Dr Lovena Le for atrial flutter  . Ablation  01-24-2014    repeat CTI ablation by Dr Lovena Le  . Atrial flutter ablation N/A 10/15/2012    Procedure: ATRIAL FLUTTER ABLATION;  Surgeon: Evans Lance, MD;  Location: [facility] CATH LAB;  Service: Cardiovascular;  Laterality: N/A;  . Atrial flutter ablation N/A 01/24/2014   Procedure: ATRIAL FLUTTER ABLATION;  Surgeon: Evans Lance, MD;  Location: [facility] CATH LAB;  Service: Cardiovascular;  Laterality: N/A;    Family History  Problem Relation Age of Onset  . Pancreatic cancer Mother   . Esophageal cancer Neg Hx   . Stomach cancer Neg Hx   . Rectal cancer Neg Hx   . Colon cancer Brother     Social History   Social History  . Marital Status: Married    Spouse Name: N/A  . Number of Children: 2  . Years of Education: N/A   Occupational History  . estimator for contractor    Social History Main Topics  . Smoking status: Former Smoker -- 1.00 packs/day for 20 years    Types: Cigarettes    Quit date: 08/08/1984  . Smokeless tobacco: Never Used  . Alcohol Use: 1.2 oz/week    2 Cans of beer per week     Comment: socially   2 beer or mixed drinks week  . Drug Use: No  . Sexual Activity: Not on file   Other Topics Concern  . Not on file   Social History Narrative   in [place] since '84    Outpatient Prescriptions Prior  to Visit  Medication Sig Dispense Refill  . ALPRAZolam (XANAX) 0.5 MG tablet TAKE 1 TABLET BY MOUTH EVERY DAY AT BEDTIME AS NEEDED FOR SLEEP 90 tablet 0  . Ascorbic Acid (VITAMIN C) 500 MG CHEW Pt takes 2 chewables daily.    . ASPIRIN EC PO Take by mouth 2 (two) times daily.    . benazepril (LOTENSIN) 40 MG tablet TAKE 1 TABLET (40 MG TOTAL) BY MOUTH DAILY. 90 tablet 1  . Cholecalciferol (VITAMIN D3) 2000 UNITS TABS Take by mouth.    . COLLAGEN PO Take by mouth. Collagen also has vitamin C    . Cyanocobalamin (VITAMIN B-12) 5000 MCG SUBL Place under the tongue.    . fenofibrate 160 MG tablet Take 1 tablet (160 mg total) by mouth daily. 90 tablet 1  . gabapentin (NEURONTIN) 300 MG capsule Take 1 capsule (300 mg total) by mouth 3 (three) times daily. 270 capsule 1  . Multiple Vitamin (MULTIVITAMIN WITH MINERALS) TABS Take 1 tablet by mouth daily.    . verapamil (CALAN-SR) 240 MG CR tablet Take 1 tablet (240 mg total) by mouth at  bedtime. 90 tablet 1  . predniSONE (DELTASONE) 10 MG tablet 3 tabs x3 days and then 2 tabs x3 days and then 1 tab x3 days.  Take w/ food. (Patient not taking: Reported on 05/18/2015) 18 tablet 0  . PROLENSA 0.07 % SOLN 1 DROP IN RIGHT EYE DAILY START THIS DROP1 DAY BEFORE SURGERY AND CONTINUE UNTIL GONE  0  . traMADol (ULTRAM) 50 MG tablet Take 1-2 tablets by mouth every 6 hours as needed for pain. (Patient not taking: Reported on 05/18/2015) 30 tablet 0   No facility-administered medications prior to visit.    Allergies  Allergen Reactions  . Statins Other (See Comments)    Body aches, memory loss    Review of Systems  Constitutional: Negative for fever and malaise/fatigue.  HENT: Negative for congestion.   Eyes: Negative for discharge.  Respiratory: Negative for shortness of breath.   Cardiovascular: Negative for chest pain, palpitations and leg swelling.  Gastrointestinal: Negative for nausea and abdominal pain.  Genitourinary: Negative for dysuria.  Musculoskeletal: Positive for back pain. Negative for falls.  Skin: Negative for rash.  Neurological: Negative for loss of consciousness and headaches.  Endo/Heme/Allergies: Negative for environmental allergies.  Psychiatric/Behavioral: Negative for depression. The patient is not nervous/anxious.        Objective:    Physical Exam  Constitutional: He is oriented to person, place, and time. Vital signs are normal. He appears well-developed and well-nourished. He is sleeping.  HENT:  Head: Normocephalic and atraumatic.  Mouth/Throat: Oropharynx is clear and moist.  Eyes: EOM are normal. Pupils are equal, round, and reactive to light.  Neck: Normal range of motion. Neck supple. No thyromegaly present.  Cardiovascular: Normal rate and regular rhythm.   No murmur heard. Pulmonary/Chest: Effort normal and breath sounds normal. No respiratory distress. He has no wheezes. He has no rales. He exhibits no tenderness.  Musculoskeletal: He  exhibits tenderness. He exhibits no edema.  Neurological: He is alert and oriented to person, place, and time. He has normal strength and normal reflexes. He displays normal reflexes. No cranial nerve deficit. Coordination normal.  No pain with bending but + pain with getting back up and extension  Skin: Skin is warm and dry.  Psychiatric: He has a normal mood and affect. His behavior is normal. Judgment and thought content normal.    BP 126/80 mmHg  Pulse 80  Temp(Src) 98.1 F (36.7 C) (Oral)  Ht 5\' 11"  (1.803 m)  Wt 215 lb (97.523 kg)  BMI 30.00 kg/m2  SpO2 98% Wt Readings from Last 3 Encounters:  05/18/15 215 lb (97.523 kg)  03/08/15 215 lb 6 oz (97.693 kg)  01/13/15 218 lb 6 oz (99.054 kg)     Lab Results  Component Value Date   WBC 6.0 01/05/2015   HGB 14.9 01/05/2015   HCT 43.7 01/05/2015   PLT 193.0 01/05/2015   GLUCOSE 109* 01/05/2015   CHOL 185 01/05/2015   TRIG 113.0 01/05/2015   HDL 44.70 01/05/2015   LDLDIRECT 137.9 12/30/2012   LDLCALC 118* 01/05/2015   ALT 17 01/05/2015   AST 21 01/05/2015   NA 139 01/05/2015   K 4.0 01/05/2015   CL 105 01/05/2015   CREATININE 1.36 01/05/2015   BUN 22 01/05/2015   CO2 29 01/05/2015   TSH 1.64 01/05/2015   PSA 8.82* 06/06/2008   INR 1.2* 05/12/2012   HGBA1C 5.9 01/05/2015    Lab Results  Component Value Date   TSH 1.64 01/05/2015   Lab Results  Component Value Date   WBC 6.0 01/05/2015   HGB 14.9 01/05/2015   HCT 43.7 01/05/2015   MCV 88.6 01/05/2015   PLT 193.0 01/05/2015   Lab Results  Component Value Date   NA 139 01/05/2015   K 4.0 01/05/2015   CO2 29 01/05/2015   GLUCOSE 109* 01/05/2015   BUN 22 01/05/2015   CREATININE 1.36 01/05/2015   BILITOT 0.6 01/05/2015   ALKPHOS 45 01/05/2015   AST 21 01/05/2015   ALT 17 01/05/2015   PROT 6.7 01/05/2015   ALBUMIN 3.8 01/05/2015   CALCIUM 9.0 01/05/2015   GFR 54.46* 01/05/2015   Lab Results  Component Value Date   CHOL 185 01/05/2015   Lab  Results  Component Value Date   HDL 44.70 01/05/2015   Lab Results  Component Value Date   LDLCALC 118* 01/05/2015   Lab Results  Component Value Date   TRIG 113.0 01/05/2015   Lab Results  Component Value Date   CHOLHDL 4 01/05/2015   Lab Results  Component Value Date   HGBA1C 5.9 01/05/2015   ua-- + ketones and protein    Assessment & Plan:   Problem List Items Addressed This Visit    Essential hypertension   Relevant Orders   Comprehensive metabolic panel    Other Visit Diagnoses    Dark urine    -  Primary    Relevant Orders    POCT urinalysis dipstick (Completed)    Urine Culture    Midline low back pain, with sciatica presence unspecified        Relevant Orders    DG Lumbar Spine Complete (Completed)    Urine Culture    Need for prophylactic vaccination and inoculation against influenza        Relevant Orders    Flu vaccine HIGH DOSE PF (Fluzone High dose) (Completed)     pt did not want any pain meds  I have discontinued Mr. Cuadra PROLENSA, traMADol, and predniSONE. I am also having him maintain his multivitamin with minerals, ASPIRIN EC PO, gabapentin, Vitamin B-12, Vitamin D3, COLLAGEN PO, Vitamin C, benazepril, verapamil, fenofibrate, and ALPRAZolam.  No orders of the defined types were placed in this encounter.     Garnet Koyanagi, DO

## 2015-05-18 NOTE — Progress Notes (Signed)
Pre visit review using our clinic review tool, if applicable. No additional management support is needed unless otherwise documented below in the visit note. 

## 2015-05-18 NOTE — Patient Instructions (Signed)
Back Pain, Adult Low back pain is very common. About 1 in 5 people have back pain.The cause of low back pain is rarely dangerous. The pain often gets better over time.About half of people with a sudden onset of back pain feel better in just 2 weeks. About 8 in 10 people feel better by 6 weeks.  CAUSES Some common causes of back pain include:  Strain of the muscles or ligaments supporting the spine.  Wear and tear (degeneration) of the spinal discs.  Arthritis.  Direct injury to the back. DIAGNOSIS Most of the time, the direct cause of low back pain is not known.However, back pain can be treated effectively even when the exact cause of the pain is unknown.Answering your caregiver's questions about your overall health and symptoms is one of the most accurate ways to make sure the cause of your pain is not dangerous. If your caregiver needs more information, he or she may order lab work or imaging tests (X-rays or MRIs).However, even if imaging tests show changes in your back, this usually does not require surgery. HOME CARE INSTRUCTIONS For many people, back pain returns.Since low back pain is rarely dangerous, it is often a condition that people can learn to manageon their own.   Remain active. It is stressful on the back to sit or stand in one place. Do not sit, drive, or stand in one place for more than 30 minutes at a time. Take short walks on level surfaces as soon as pain allows.Try to increase the length of time you walk each day.  Do not stay in bed.Resting more than 1 or 2 days can delay your recovery.  Do not avoid exercise or work.Your body is made to move.It is not dangerous to be active, even though your back may hurt.Your back will likely heal faster if you return to being active before your pain is gone.  Pay attention to your body when you bend and lift. Many people have less discomfortwhen lifting if they bend their knees, keep the load close to their bodies,and  avoid twisting. Often, the most comfortable positions are those that put less stress on your recovering back.  Find a comfortable position to sleep. Use a firm mattress and lie on your side with your knees slightly bent. If you lie on your back, put a pillow under your knees.  Only take over-the-counter or prescription medicines as directed by your caregiver. Over-the-counter medicines to reduce pain and inflammation are often the most helpful.Your caregiver may prescribe muscle relaxant drugs.These medicines help dull your pain so you can more quickly return to your normal activities and healthy exercise.  Put ice on the injured area.  Put ice in a plastic bag.  Place a towel between your skin and the bag.  Leave the ice on for 15-20 minutes, 03-04 times a day for the first 2 to 3 days. After that, ice and heat may be alternated to reduce pain and spasms.  Ask your caregiver about trying back exercises and gentle massage. This may be of some benefit.  Avoid feeling anxious or stressed.Stress increases muscle tension and can worsen back pain.It is important to recognize when you are anxious or stressed and learn ways to manage it.Exercise is a great option. SEEK MEDICAL CARE IF:  You have pain that is not relieved with rest or medicine.  You have pain that does not improve in 1 week.  You have new symptoms.  You are generally not feeling well. SEEK   IMMEDIATE MEDICAL CARE IF:   You have pain that radiates from your back into your legs.  You develop new bowel or bladder control problems.  You have unusual weakness or numbness in your arms or legs.  You develop nausea or vomiting.  You develop abdominal pain.  You feel faint. Document Released: 08/05/2005 Document Revised: 02/04/2012 Document Reviewed: 12/07/2013 ExitCare Patient Information 2015 [employer]. This information is not intended to replace advice given to you by your health care provider. Make sure you  discuss any questions you have with your health care provider.  

## 2015-05-19 LAB — URINE CULTURE
COLONY COUNT: NO GROWTH
ORGANISM ID, BACTERIA: NO GROWTH

## 2015-06-15 ENCOUNTER — Encounter (INDEPENDENT_AMBULATORY_CARE_PROVIDER_SITE_OTHER): Payer: Medicare Other | Admitting: Ophthalmology

## 2015-06-15 DIAGNOSIS — I10 Essential (primary) hypertension: Secondary | ICD-10-CM

## 2015-06-15 DIAGNOSIS — H35033 Hypertensive retinopathy, bilateral: Secondary | ICD-10-CM | POA: Diagnosis not present

## 2015-06-15 DIAGNOSIS — H35373 Puckering of macula, bilateral: Secondary | ICD-10-CM

## 2015-06-15 DIAGNOSIS — H26493 Other secondary cataract, bilateral: Secondary | ICD-10-CM

## 2015-06-15 DIAGNOSIS — H43813 Vitreous degeneration, bilateral: Secondary | ICD-10-CM

## 2015-06-29 ENCOUNTER — Ambulatory Visit (INDEPENDENT_AMBULATORY_CARE_PROVIDER_SITE_OTHER): Payer: Medicare Other | Admitting: Ophthalmology

## 2015-06-29 DIAGNOSIS — H2701 Aphakia, right eye: Secondary | ICD-10-CM

## 2015-07-04 DIAGNOSIS — E291 Testicular hypofunction: Secondary | ICD-10-CM | POA: Diagnosis not present

## 2015-07-04 DIAGNOSIS — Z8546 Personal history of malignant neoplasm of prostate: Secondary | ICD-10-CM | POA: Diagnosis not present

## 2015-07-06 ENCOUNTER — Ambulatory Visit: Payer: Medicare Other | Admitting: Family Medicine

## 2015-07-10 ENCOUNTER — Encounter: Payer: Self-pay | Admitting: Family Medicine

## 2015-07-10 ENCOUNTER — Ambulatory Visit (INDEPENDENT_AMBULATORY_CARE_PROVIDER_SITE_OTHER): Payer: Medicare Other | Admitting: Family Medicine

## 2015-07-10 ENCOUNTER — Ambulatory Visit: Payer: Medicare Other | Admitting: Family Medicine

## 2015-07-10 VITALS — BP 112/66 | HR 60 | Temp 97.5°F | Ht 71.5 in | Wt 206.8 lb

## 2015-07-10 DIAGNOSIS — I10 Essential (primary) hypertension: Secondary | ICD-10-CM

## 2015-07-10 DIAGNOSIS — I4892 Unspecified atrial flutter: Secondary | ICD-10-CM

## 2015-07-10 DIAGNOSIS — M25512 Pain in left shoulder: Secondary | ICD-10-CM

## 2015-07-10 DIAGNOSIS — E785 Hyperlipidemia, unspecified: Secondary | ICD-10-CM | POA: Diagnosis not present

## 2015-07-10 DIAGNOSIS — R7303 Prediabetes: Secondary | ICD-10-CM

## 2015-07-10 DIAGNOSIS — M25511 Pain in right shoulder: Secondary | ICD-10-CM

## 2015-07-10 NOTE — Assessment & Plan Note (Signed)
Followed by cards- Dr. Lovena Le. S/p ablation x 2. Asymptomatic.

## 2015-07-10 NOTE — Progress Notes (Signed)
Subjective:   Patient ID: [name], male    DOB: [birth year], 74 y.o.   MRN: [id number]  [name] is a pleasant 74 y.o. year old male who presents to clinic today with [place] and Shoulder Pain  on 07/10/2015  HPI:  Establishing care from Dr. Birdie Riddle.  Shoulder pain- has been an ongoing issue. Saw Dr. Birdie Riddle for this on 03/08/15- note reviewed. Given prednisone- she suspected rotator cuff pathology. Pain persisted and prednisone then refilled in 03/2015.   Went to ortho- had shoulder injections in 03/2015.  HLD- taking pravachol 20 mg daily and fenofibrate 160 mg daily.  Lab Results  Component Value Date   CHOL 185 01/05/2015   HDL 44.70 01/05/2015   LDLCALC 118* 01/05/2015   LDLDIRECT 137.9 12/30/2012   TRIG 113.0 01/05/2015   CHOLHDL 4 01/05/2015   Lab Results  Component Value Date   ALT 17 01/05/2015   AST 21 01/05/2015   ALKPHOS 45 01/05/2015   BILITOT 0.6 01/05/2015   HTN- has been well controlled on Benazapril 40 mg daily and Verapamil daily. Denies HA, blurred vision, CP or SOB. No LE edema.  A flutter- s/ p ablation.  Taking ASA- two 81 mg tablets daily.  Neuropathy- traumatic s/p back surgery.  Taking Gabapentin 300 mg twice daily.  Lab Results  Component Value Date   CREATININE 1.36 01/05/2015   Current Outpatient Prescriptions on File Prior to Visit  Medication Sig Dispense Refill  . ALPRAZolam (XANAX) 0.5 MG tablet TAKE 1 TABLET BY MOUTH EVERY DAY AT BEDTIME AS NEEDED FOR SLEEP 90 tablet 0  . Ascorbic Acid (VITAMIN C) 500 MG CHEW Pt takes 2 chewables daily.    . ASPIRIN EC PO Take by mouth 2 (two) times daily.    . benazepril (LOTENSIN) 40 MG tablet TAKE 1 TABLET (40 MG TOTAL) BY MOUTH DAILY. 90 tablet 1  . Cholecalciferol (VITAMIN D3) 2000 UNITS TABS Take by mouth.    . COLLAGEN PO Take by mouth. Collagen also has vitamin C    . Cyanocobalamin (VITAMIN B-12) 5000 MCG SUBL Place under the tongue.    . fenofibrate 160 MG tablet Take 1  tablet (160 mg total) by mouth daily. 90 tablet 1  . gabapentin (NEURONTIN) 300 MG capsule Take 1 capsule (300 mg total) by mouth 3 (three) times daily. (Patient taking differently: Take 300 mg by mouth 2 (two) times daily. ) 270 capsule 1  . Multiple Vitamin (MULTIVITAMIN WITH MINERALS) TABS Take 1 tablet by mouth daily.    . verapamil (CALAN-SR) 240 MG CR tablet Take 1 tablet (240 mg total) by mouth at bedtime. 90 tablet 1  . [DISCONTINUED] pravastatin (PRAVACHOL) 20 MG tablet Take 1 tablet (20 mg total) by mouth daily. 30 tablet 6   No current facility-administered medications on file prior to visit.    Allergies  Allergen Reactions  . Statins Other (See Comments)    Body aches, memory loss    Past Medical History  Diagnosis Date  . Hypertension   . Hyperlipidemia   . Hearing loss   . Atrial flutter ([place]) 03-2012  . Headache(784.0)     migraines x 3 in life  . Melanoma ([place])     melanoma left forearm 2005- no chemo  . Chronic back pain   . HYPOGONADISM, MALE 10/12/2007    Followup per urology    . PROSTATE CANCER, HX OF 10/06/2008    Status post surgery approximately 2010  Past Surgical History  Procedure Laterality Date  . Melanoma excision  12/13/03    [place] [facility]  . Colonoscopy w/ polypectomy    . Tonsillectomy  1947  . Rotator cuff repair  2008    right; Dr Justice Britain  . Arhtroscopy  right knee  2010  . Lumbar laminectomy/decompression microdiscectomy  08/16/2011    Procedure: LUMBAR LAMINECTOMY/DECOMPRESSION MICRODISCECTOMY;  Surgeon: Dahlia Bailiff;  Location: [facility];  Service: Orthopedics;  Laterality: N/A;  Lumbar Decompression and Foraminotomy L4-S1    . Prostatectomy  07/2008  . Back surgery  07/2006    lumbar discectomy  . Cardiac catheterization      15 years ago  . Cardioversion  05/18/2012    Procedure: CARDIOVERSION;  Surgeon: Peter M Martinique, MD;  Location: [facility] ENDOSCOPY;  Service: Cardiovascular;  Laterality: N/A;  on eloquis on-going  .  Ablation of dysrhythmic focus  10/15/2012    CTI ablation by Dr Lovena Le for atrial flutter  . Ablation  01-24-2014    repeat CTI ablation by Dr Lovena Le  . Atrial flutter ablation N/A 10/15/2012    Procedure: ATRIAL FLUTTER ABLATION;  Surgeon: Evans Lance, MD;  Location[facility] CATH LAB;  Service: Cardiovascular;  Laterality: N/A;  . Atrial flutter ablation N/A 01/24/2014    Procedure: ATRIAL FLUTTER ABLATION;  Surgeon: Evans Lance, MD;  Location: [facility] CATH LAB;  Service: Cardiovascular;  Laterality: N/A;    Family History  Problem Relation Age of Onset  . Pancreatic cancer Mother   . Esophageal cancer Neg Hx   . Stomach cancer Neg Hx   . Rectal cancer Neg Hx   . Colon cancer Brother     Social History   Social History  . Marital Status: Married    Spouse Name: N/A  . Number of Children: 2  . Years of Education: N/A   Occupational History  . estimator for contractor    Social History Main Topics  . Smoking status: Former Smoker -- 1.00 packs/day for 20 years    Types: Cigarettes    Quit date: 08/08/1984  . Smokeless tobacco: Never Used  . Alcohol Use: 1.2 oz/week    2 Cans of beer per week     Comment: socially   2 beer or mixed drinks week  . Drug Use: No  . Sexual Activity: No   Other Topics Concern  . Not on file   Social History Narrative   in [place] since '84   The PMH, PSH, Social History, Family History, Medications, and allergies have been reviewed in [facility], and have been updated if relevant.    Review of Systems  Constitutional: Negative.   HENT: Negative.   Eyes: Negative.   Respiratory: Negative.   Cardiovascular: Negative.   Gastrointestinal: Negative.   Endocrine: Negative.   Genitourinary: Negative.   Musculoskeletal: Positive for arthralgias.  Neurological: Positive for numbness.  Hematological: Negative.   Psychiatric/Behavioral: Negative.   All other systems reviewed and are negative.      Objective:    BP 112/66 mmHg  Pulse 60  Temp(Src)  97.5 F (36.4 C) (Oral)  Ht 5' 11.5" (1.816 m)  Wt 206 lb 12 oz (93.781 kg)  BMI 28.44 kg/m2  SpO2 95%   Physical Exam  Constitutional: He is oriented to person, place, and time. He appears well-developed and well-nourished. No distress.  HENT:  Head: Normocephalic and atraumatic.  Eyes: Conjunctivae are normal.  Cardiovascular: Normal rate.   Pulmonary/Chest: Effort normal.  Musculoskeletal: Normal  range of motion.  Neurological: He is alert and oriented to person, place, and time. No cranial nerve deficit.  Skin: Skin is warm and dry.  Psychiatric: He has a normal mood and affect. His behavior is normal. Judgment and thought content normal.  Nursing note and vitals reviewed.         Assessment & Plan:   Essential hypertension  Prediabetes  Atrial flutter, unspecified type ([place])  Hyperlipidemia No Follow-up on file.

## 2015-07-10 NOTE — Assessment & Plan Note (Signed)
Improved s/p subacromial injections- ortho.

## 2015-07-10 NOTE — Assessment & Plan Note (Signed)
Due for labs in May 2016.

## 2015-07-10 NOTE — Patient Instructions (Signed)
It was great to meet you. Please come back to see me in May for your yearly physical.

## 2015-07-10 NOTE — Assessment & Plan Note (Signed)
Well controlled on current rxs. No changes made today. 

## 2015-07-10 NOTE — Progress Notes (Signed)
Pre visit review using our clinic review tool, if applicable. No additional management support is needed unless otherwise documented below in the visit note. 

## 2015-07-11 DIAGNOSIS — Z8546 Personal history of malignant neoplasm of prostate: Secondary | ICD-10-CM | POA: Diagnosis not present

## 2015-07-11 DIAGNOSIS — E291 Testicular hypofunction: Secondary | ICD-10-CM | POA: Diagnosis not present

## 2015-07-17 ENCOUNTER — Other Ambulatory Visit: Payer: Self-pay | Admitting: Family Medicine

## 2015-07-18 NOTE — Telephone Encounter (Signed)
Medication filled to pharmacy as requested.   

## 2015-07-24 ENCOUNTER — Other Ambulatory Visit: Payer: Self-pay | Admitting: Family Medicine

## 2015-07-24 ENCOUNTER — Encounter: Payer: Self-pay | Admitting: Family Medicine

## 2015-07-24 MED ORDER — ALPRAZOLAM 0.5 MG PO TABS: ORAL_TABLET | ORAL | Status: DC

## 2015-07-24 NOTE — Telephone Encounter (Signed)
Rx faxed to requested pharmacy 

## 2015-07-24 NOTE — Telephone Encounter (Signed)
Pt recently established care. pls advise 

## 2015-07-27 ENCOUNTER — Encounter (INDEPENDENT_AMBULATORY_CARE_PROVIDER_SITE_OTHER): Payer: Medicare Other | Admitting: Ophthalmology

## 2015-07-27 DIAGNOSIS — H35373 Puckering of macula, bilateral: Secondary | ICD-10-CM

## 2015-07-27 DIAGNOSIS — H43813 Vitreous degeneration, bilateral: Secondary | ICD-10-CM | POA: Diagnosis not present

## 2015-07-27 DIAGNOSIS — H26492 Other secondary cataract, left eye: Secondary | ICD-10-CM

## 2015-07-27 DIAGNOSIS — I10 Essential (primary) hypertension: Secondary | ICD-10-CM

## 2015-07-27 DIAGNOSIS — H35033 Hypertensive retinopathy, bilateral: Secondary | ICD-10-CM

## 2015-08-24 ENCOUNTER — Ambulatory Visit (INDEPENDENT_AMBULATORY_CARE_PROVIDER_SITE_OTHER): Payer: Medicare Other | Admitting: Ophthalmology

## 2015-08-24 DIAGNOSIS — H2702 Aphakia, left eye: Secondary | ICD-10-CM

## 2015-09-04 ENCOUNTER — Other Ambulatory Visit: Payer: Self-pay | Admitting: Family Medicine

## 2015-09-05 ENCOUNTER — Other Ambulatory Visit: Payer: Self-pay | Admitting: Family Medicine

## 2015-09-05 NOTE — Telephone Encounter (Signed)
Pt recently established care 07/2015. pls advise

## 2015-09-13 ENCOUNTER — Encounter (INDEPENDENT_AMBULATORY_CARE_PROVIDER_SITE_OTHER): Payer: Medicare Other | Admitting: Ophthalmology

## 2015-09-13 DIAGNOSIS — H2702 Aphakia, left eye: Secondary | ICD-10-CM

## 2015-10-13 ENCOUNTER — Other Ambulatory Visit: Payer: Self-pay | Admitting: Family Medicine

## 2015-10-13 NOTE — Telephone Encounter (Signed)
Dr. Deborra Medina patient now.

## 2015-10-18 ENCOUNTER — Other Ambulatory Visit: Payer: Self-pay | Admitting: *Deleted

## 2015-10-18 DIAGNOSIS — Z961 Presence of intraocular lens: Secondary | ICD-10-CM | POA: Diagnosis not present

## 2015-10-18 MED ORDER — ALPRAZOLAM 0.5 MG PO TABS: 0.5000 mg | ORAL_TABLET | Freq: Every evening | ORAL | Status: DC | PRN

## 2015-10-18 NOTE — Telephone Encounter (Signed)
Pt has only been seen to establish care in 06/2015

## 2015-10-19 MED ORDER — ALPRAZOLAM 0.5 MG PO TABS: 0.5000 mg | ORAL_TABLET | Freq: Every evening | ORAL | Status: DC | PRN

## 2015-10-19 NOTE — Addendum Note (Signed)
Addended by: Modena Nunnery on: 10/19/2015 01:08 PM   Modules accepted: Orders

## 2015-10-19 NOTE — Telephone Encounter (Signed)
Rx faxed to requested pharmacy 

## 2015-11-13 DIAGNOSIS — D225 Melanocytic nevi of trunk: Secondary | ICD-10-CM | POA: Diagnosis not present

## 2015-11-13 DIAGNOSIS — L812 Freckles: Secondary | ICD-10-CM | POA: Diagnosis not present

## 2015-11-13 DIAGNOSIS — D1801 Hemangioma of skin and subcutaneous tissue: Secondary | ICD-10-CM | POA: Diagnosis not present

## 2015-11-13 DIAGNOSIS — L821 Other seborrheic keratosis: Secondary | ICD-10-CM | POA: Diagnosis not present

## 2015-11-13 DIAGNOSIS — Z85828 Personal history of other malignant neoplasm of skin: Secondary | ICD-10-CM | POA: Diagnosis not present

## 2015-11-13 DIAGNOSIS — D485 Neoplasm of uncertain behavior of skin: Secondary | ICD-10-CM | POA: Diagnosis not present

## 2015-11-13 DIAGNOSIS — Z85820 Personal history of malignant melanoma of skin: Secondary | ICD-10-CM | POA: Diagnosis not present

## 2015-11-13 DIAGNOSIS — L570 Actinic keratosis: Secondary | ICD-10-CM | POA: Diagnosis not present

## 2015-11-29 DIAGNOSIS — Z961 Presence of intraocular lens: Secondary | ICD-10-CM | POA: Diagnosis not present

## 2015-11-29 DIAGNOSIS — H35373 Puckering of macula, bilateral: Secondary | ICD-10-CM | POA: Diagnosis not present

## 2015-11-29 DIAGNOSIS — H43813 Vitreous degeneration, bilateral: Secondary | ICD-10-CM | POA: Diagnosis not present

## 2015-12-01 ENCOUNTER — Other Ambulatory Visit: Payer: Self-pay | Admitting: Family Medicine

## 2016-01-02 ENCOUNTER — Other Ambulatory Visit: Payer: Self-pay | Admitting: Family Medicine

## 2016-01-02 DIAGNOSIS — Z0000 Encounter for general adult medical examination without abnormal findings: Secondary | ICD-10-CM

## 2016-01-02 DIAGNOSIS — L089 Local infection of the skin and subcutaneous tissue, unspecified: Secondary | ICD-10-CM | POA: Diagnosis not present

## 2016-01-02 DIAGNOSIS — D485 Neoplasm of uncertain behavior of skin: Secondary | ICD-10-CM | POA: Diagnosis not present

## 2016-01-02 MED ORDER — FENOFIBRATE 160 MG PO TABS: 160.0000 mg | ORAL_TABLET | Freq: Every day | ORAL | Status: DC

## 2016-01-02 MED ORDER — ALPRAZOLAM 0.5 MG PO TABS: 0.5000 mg | ORAL_TABLET | Freq: Every evening | ORAL | Status: DC | PRN

## 2016-01-02 NOTE — Telephone Encounter (Signed)
Pt requesting a refill on the xanax. Last filled 10/19/15,  Last OV 07/10/15. Pt has a physical scheduled on 01/11/16. Ok to refill?

## 2016-01-04 ENCOUNTER — Other Ambulatory Visit: Payer: Self-pay | Admitting: Family Medicine

## 2016-01-07 ENCOUNTER — Other Ambulatory Visit: Payer: Self-pay | Admitting: Family Medicine

## 2016-01-09 ENCOUNTER — Other Ambulatory Visit (INDEPENDENT_AMBULATORY_CARE_PROVIDER_SITE_OTHER): Payer: Medicare Other

## 2016-01-09 ENCOUNTER — Encounter: Payer: Self-pay | Admitting: Radiology

## 2016-01-09 DIAGNOSIS — Z79899 Other long term (current) drug therapy: Secondary | ICD-10-CM | POA: Diagnosis not present

## 2016-01-09 DIAGNOSIS — Z0000 Encounter for general adult medical examination without abnormal findings: Secondary | ICD-10-CM

## 2016-01-09 LAB — CBC WITH DIFFERENTIAL/PLATELET
BASOS ABS: 0 10*3/uL (ref 0.0–0.1)
Basophils Relative: 0.7 % (ref 0.0–3.0)
Eosinophils Absolute: 0.2 10*3/uL (ref 0.0–0.7)
Eosinophils Relative: 3.1 % (ref 0.0–5.0)
HCT: 45.2 % (ref 39.0–52.0)
Hemoglobin: 15.4 g/dL (ref 13.0–17.0)
LYMPHS ABS: 1.6 10*3/uL (ref 0.7–4.0)
Lymphocytes Relative: 27.8 % (ref 12.0–46.0)
MCHC: 34.1 g/dL (ref 30.0–36.0)
MCV: 88.2 fl (ref 78.0–100.0)
MONOS PCT: 7.5 % (ref 3.0–12.0)
Monocytes Absolute: 0.4 10*3/uL (ref 0.1–1.0)
NEUTROS PCT: 60.9 % (ref 43.0–77.0)
Neutro Abs: 3.5 10*3/uL (ref 1.4–7.7)
Platelets: 198 10*3/uL (ref 150.0–400.0)
RBC: 5.13 Mil/uL (ref 4.22–5.81)
RDW: 13.6 % (ref 11.5–15.5)
WBC: 5.7 10*3/uL (ref 4.0–10.5)

## 2016-01-09 LAB — LIPID PANEL
CHOL/HDL RATIO: 5
Cholesterol: 203 mg/dL — ABNORMAL HIGH (ref 0–200)
HDL: 38.4 mg/dL — AB (ref >39.00)
LDL CALC: 134 mg/dL — AB (ref 0–99)
NonHDL: 164.28
TRIGLYCERIDES: 151 mg/dL — AB (ref 0.0–149.0)
VLDL: 30.2 mg/dL (ref 0.0–40.0)

## 2016-01-09 LAB — COMPREHENSIVE METABOLIC PANEL
ALT: 16 U/L (ref 0–53)
AST: 20 U/L (ref 0–37)
Albumin: 4.2 g/dL (ref 3.5–5.2)
Alkaline Phosphatase: 41 U/L (ref 39–117)
BILIRUBIN TOTAL: 0.7 mg/dL (ref 0.2–1.2)
BUN: 19 mg/dL (ref 6–23)
CO2: 26 mEq/L (ref 19–32)
CREATININE: 1.25 mg/dL (ref 0.40–1.50)
Calcium: 8.9 mg/dL (ref 8.4–10.5)
Chloride: 107 mEq/L (ref 96–112)
GFR: 59.86 mL/min — ABNORMAL LOW (ref >60.00)
GLUCOSE: 109 mg/dL — AB (ref 70–99)
Potassium: 4.2 mEq/L (ref 3.5–5.1)
SODIUM: 140 meq/L (ref 135–145)
TOTAL PROTEIN: 6.4 g/dL (ref 6.0–8.3)

## 2016-01-09 LAB — PSA: PSA: 0.01 ng/mL — ABNORMAL LOW (ref 0.10–4.00)

## 2016-01-10 ENCOUNTER — Encounter: Payer: Medicare Other | Admitting: Family Medicine

## 2016-01-11 ENCOUNTER — Encounter: Payer: Self-pay | Admitting: Family Medicine

## 2016-01-11 ENCOUNTER — Ambulatory Visit (INDEPENDENT_AMBULATORY_CARE_PROVIDER_SITE_OTHER): Payer: Medicare Other | Admitting: Family Medicine

## 2016-01-11 VITALS — BP 124/62 | HR 62 | Temp 97.6°F | Ht 71.5 in | Wt 211.0 lb

## 2016-01-11 DIAGNOSIS — I10 Essential (primary) hypertension: Secondary | ICD-10-CM

## 2016-01-11 DIAGNOSIS — I4892 Unspecified atrial flutter: Secondary | ICD-10-CM | POA: Diagnosis not present

## 2016-01-11 DIAGNOSIS — Z8546 Personal history of malignant neoplasm of prostate: Secondary | ICD-10-CM

## 2016-01-11 DIAGNOSIS — E785 Hyperlipidemia, unspecified: Secondary | ICD-10-CM

## 2016-01-11 DIAGNOSIS — Z0000 Encounter for general adult medical examination without abnormal findings: Secondary | ICD-10-CM | POA: Diagnosis not present

## 2016-01-11 MED ORDER — ALPRAZOLAM 0.5 MG PO TABS: 0.5000 mg | ORAL_TABLET | Freq: Every evening | ORAL | Status: DC | PRN

## 2016-01-11 MED ORDER — FENOFIBRATE 160 MG PO TABS: ORAL_TABLET | ORAL | Status: DC

## 2016-01-11 NOTE — Assessment & Plan Note (Signed)
The patients weight, height, BMI and visual acuity have been recorded in the chart.  Cognitive function assessed.   I have made referrals, counseling and provided education to the patient based review of the above and I have provided the pt with a written personalized care plan for preventive services.  

## 2016-01-11 NOTE — Assessment & Plan Note (Signed)
Well controlled. No changes made to rxs. 

## 2016-01-11 NOTE — Patient Instructions (Signed)
Great to see you. Happy birthday!  Try the stretches and let me know you're doing in a few weeks.

## 2016-01-11 NOTE — Assessment & Plan Note (Signed)
Followed by Dr. Lovena Le. Asymptomatic s/p ablation.

## 2016-01-11 NOTE — Progress Notes (Signed)
Pre visit review using our clinic review tool, if applicable. No additional management support is needed unless otherwise documented below in the visit note. 

## 2016-01-11 NOTE — Assessment & Plan Note (Signed)
Reviewed preventive care protocols, scheduled due services, and updated immunizations Discussed nutrition, exercise, diet, and healthy lifestyle.  

## 2016-01-11 NOTE — Progress Notes (Signed)
Subjective:   Patient ID: [name], male    DOB: 1941/01/04, 75 y.o.   MRN: [id number]  [name] is a pleasant 75 y.o. year old male who presents to clinic today with Annual Exam  and follow up of chronic medical conditions on 01/11/2016  HPI:  I have personally reviewed the Medicare Annual Wellness questionnaire and have noted 1. The patient's medical and social history 2. Their use of alcohol, tobacco or illicit drugs 3. Their current medications and supplements 4. The patient's functional ability including ADL's, fall risks, home safety risks and hearing or visual             impairment. 5. Diet and physical activities 6. Evidence for depression or mood disorders  End of life wishes discussed and updated in Social History.  The roster of all physicians providing medical care to patient - is listed in the CareTeams section of the chart.  Colonoscopy 05/06/13 Zoster 10/09/07 Tdap 03/22/15 Pneumovax 08/20/07 Prevnar 13 01/03/14       HLD- taking pravachol 20 mg daily and fenofibrate 160 mg daily.  Lab Results  Component Value Date   CHOL 203* 01/09/2016   HDL 38.40* 01/09/2016   LDLCALC 134* 01/09/2016   LDLDIRECT 137.9 12/30/2012   TRIG 151.0* 01/09/2016   CHOLHDL 5 01/09/2016   Lab Results  Component Value Date   ALT 16 01/09/2016   AST 20 01/09/2016   ALKPHOS 41 01/09/2016   BILITOT 0.7 01/09/2016   HTN- has been well controlled on Benazapril 40 mg daily and Verapamil daily. Denies HA, blurred vision, CP or SOB. No LE edema.  Lab Results  Component Value Date   CREATININE 1.25 01/09/2016   A flutter- s/ p ablation. Taking ASA- two 81 mg tablets daily.  Neuropathy- traumatic s/p back surgery. Taking Gabapentin 300 mg twice daily.  Lab Results  Component Value Date   PSA 0.01* 01/09/2016   PSA 8.82* 06/06/2008    Current Outpatient Prescriptions on File Prior to Visit  Medication Sig Dispense Refill  . ALPRAZolam (XANAX) 0.5 MG tablet Take 1  tablet (0.5 mg total) by mouth at bedtime as needed for anxiety. 30 tablet 0  . Ascorbic Acid (VITAMIN C) 500 MG CHEW Pt takes 2 chewables daily.    . ASPIRIN EC PO Take by mouth 2 (two) times daily.    . benazepril (LOTENSIN) 40 MG tablet Take 1 tablet by mouth  daily 90 tablet 1  . Cholecalciferol (VITAMIN D3) 2000 UNITS TABS Take by mouth.    . COLLAGEN PO Take by mouth. Collagen also has vitamin C    . Cyanocobalamin (VITAMIN B-12) 5000 MCG SUBL Place under the tongue.    . fenofibrate 160 MG tablet Take 1 tablet by mouth  daily 90 tablet 1  . Multiple Vitamin (MULTIVITAMIN WITH MINERALS) TABS Take 1 tablet by mouth daily.    . verapamil (CALAN-SR) 240 MG CR tablet Take 1 tablet by mouth at  bedtime 90 tablet 1  . [DISCONTINUED] pravastatin (PRAVACHOL) 20 MG tablet Take 1 tablet (20 mg total) by mouth daily. 30 tablet 6   No current facility-administered medications on file prior to visit.    Allergies  Allergen Reactions  . Statins Other (See Comments)    Body aches, memory loss    Past Medical History  Diagnosis Date  . Hypertension   . Hyperlipidemia   . Hearing loss   . Atrial flutter ([place]) 03-2012  . Headache(784.0)  migraines x 3 in life  . Melanoma ([place])     melanoma left forearm 2005- no chemo  . Chronic back pain   . HYPOGONADISM, MALE 10/12/2007    Followup per urology    . PROSTATE CANCER, HX OF 10/06/2008    Status post surgery approximately 2010      Past Surgical History  Procedure Laterality Date  . Melanoma excision  12/13/03    [place] [facility]  . Colonoscopy w/ polypectomy    . Tonsillectomy  1947  . Rotator cuff repair  2008    right; Dr Justice Britain  . Arhtroscopy  right knee  2010  . Lumbar laminectomy/decompression microdiscectomy  08/16/2011    Procedure: LUMBAR LAMINECTOMY/DECOMPRESSION MICRODISCECTOMY;  Surgeon: Dahlia Bailiff;  Location: [facility];  Service: Orthopedics;  Laterality: N/A;  Lumbar Decompression and Foraminotomy L4-S1    .  Prostatectomy  07/2008  . Back surgery  07/2006    lumbar discectomy  . Cardiac catheterization      15 years ago  . Cardioversion  05/18/2012    Procedure: CARDIOVERSION;  Surgeon: Peter M Martinique, MD;  Location: [facility] ENDOSCOPY;  Service: Cardiovascular;  Laterality: N/A;  on eloquis on-going  . Ablation of dysrhythmic focus  10/15/2012    CTI ablation by Dr Lovena Le for atrial flutter  . Ablation  01-24-2014    repeat CTI ablation by Dr Lovena Le  . Atrial flutter ablation N/A 10/15/2012    Procedure: ATRIAL FLUTTER ABLATION;  Surgeon: Evans Lance, MD;  [facility] CATH LAB;  Service: Cardiovascular;  Laterality: N/A;  . Atrial flutter ablation N/A 01/24/2014    Procedure: ATRIAL FLUTTER ABLATION;  Surgeon: Evans Lance, MD;  Location[facility] CATH LAB;  Service: Cardiovascular;  Laterality: N/A;    Family History  Problem Relation Age of Onset  . Pancreatic cancer Mother   . Esophageal cancer Neg Hx   . Stomach cancer Neg Hx   . Rectal cancer Neg Hx   . Colon cancer Brother     Social History   Social History  . Marital Status: Married    Spouse Name: N/A  . Number of Children: 2  . Years of Education: N/A   Occupational History  . estimator for contractor    Social History Main Topics  . Smoking status: Former Smoker -- 1.00 packs/day for 20 years    Types: Cigarettes    Quit date: 08/08/1984  . Smokeless tobacco: Never Used  . Alcohol Use: 1.2 oz/week    2 Cans of beer per week     Comment: socially   2 beer or mixed drinks week  . Drug Use: No  . Sexual Activity: No   Other Topics Concern  . Not on file   Social History Narrative   in [place] since '84   Desires CPR   The PMH, [place], Social History, Family History, Medications, and allergies have been reviewed in [facility], and have been updated if relevant.    Review of Systems  Constitutional: Negative.   HENT: Negative.   Respiratory: Negative.   Cardiovascular: Negative.   Gastrointestinal: Negative.   Endocrine:  Negative.   Genitourinary: Negative.   Musculoskeletal: Negative.   Skin: Negative.   Allergic/Immunologic: Negative.   Neurological: Negative.   Hematological: Negative.   Psychiatric/Behavioral: Negative.   All other systems reviewed and are negative.      Objective:    BP 124/62 mmHg  Pulse 62  Temp(Src) 97.6 F (36.4 C) (Oral)  Ht  5' 11.5" (1.816 m)  Wt 211 lb (95.709 kg)  BMI 29.02 kg/m2  SpO2 98%   Physical Exam   General:  overweght male in NAD Eyes:  PERRL Ears:  External ear exam shows no significant lesions or deformities.  Otoscopic examination reveals clear canals, tympanic membranes are intact bilaterally without bulging, retraction, inflammation or discharge. Hearing is grossly normal bilaterally. Nose:  External nasal examination shows no deformity or inflammation. Nasal mucosa are pink and moist without lesions or exudates. Mouth:  Oral mucosa and oropharynx without lesions or exudates.  Teeth in good repair. Neck:  no carotid bruit or thyromegaly no cervical or supraclavicular lymphadenopathy  Lungs:  Normal respiratory effort, chest expands symmetrically. Lungs are clear to auscultation, no crackles or wheezes. Heart:  Normal rate and regular rhythm. S1 and S2 normal without gallop, murmur, click, rub or other extra sounds. Abdomen:  Bowel sounds positive,abdomen soft and non-tender without masses, organomegaly or hernias noted. Pulses:  R and L posterior tibial pulses are full and equal bilaterally  Extremities:  no edema       Assessment & Plan:   Medicare annual wellness visit, subsequent  Atrial flutter, unspecified type [facility])  Essential hypertension  Hyperlipidemia  PROSTATE CANCER, HX OF  Visit for well man health check No Follow-up on file.

## 2016-01-11 NOTE — Addendum Note (Signed)
Addended by: Lucille Passy on: 01/11/2016 09:41 AM   Modules accepted: Orders, SmartSet

## 2016-01-21 ENCOUNTER — Other Ambulatory Visit: Payer: Self-pay | Admitting: Family Medicine

## 2016-01-22 ENCOUNTER — Telehealth: Payer: Self-pay | Admitting: Family Medicine

## 2016-01-22 ENCOUNTER — Other Ambulatory Visit: Payer: Self-pay | Admitting: Family Medicine

## 2016-01-22 MED ORDER — ALPRAZOLAM 0.5 MG PO TABS: 0.5000 mg | ORAL_TABLET | Freq: Every evening | ORAL | Status: DC | PRN

## 2016-01-22 NOTE — Addendum Note (Signed)
Addended by: Modena Nunnery on: 01/22/2016 01:56 PM   Modules accepted: Orders

## 2016-01-22 NOTE — Telephone Encounter (Signed)
Last f/u 12/2015 

## 2016-01-22 NOTE — Telephone Encounter (Signed)
Please call patient about his medication.

## 2016-01-22 NOTE — Telephone Encounter (Signed)
Spoke to pt and discuss meds. Advised Rx to be sent to mail order

## 2016-01-22 NOTE — Telephone Encounter (Signed)
Rx faxed to pharmacy  

## 2016-01-31 ENCOUNTER — Encounter: Payer: Self-pay | Admitting: Family Medicine

## 2016-02-29 ENCOUNTER — Other Ambulatory Visit: Payer: Self-pay

## 2016-02-29 NOTE — Telephone Encounter (Signed)
Pt left v/m requesting ref[facility]prazolam [facility] [place]. Last refilled # 30 on 01/22/16; last annual exam on 01/11/16. Pt is out of med.

## 2016-03-01 MED ORDER — ALPRAZOLAM 0.5 MG PO TABS: 0.5000 mg | ORAL_TABLET | Freq: Every evening | ORAL | Status: DC | PRN

## 2016-03-01 NOTE — Telephone Encounter (Signed)
Rx called in to requested pharmacy 

## 2016-03-19 ENCOUNTER — Telehealth: Payer: Self-pay

## 2016-03-19 NOTE — Telephone Encounter (Signed)
Pt left v/m;pt received report from [facility] and in that report for labs done on 01/09/16 pt had notice part of testing was done for drugs. Pt said no one said anything to pt about being tested for drugs or the results. Pt request cb.

## 2016-03-19 NOTE — Telephone Encounter (Signed)
Please call pt to explain our controlled substances policy.

## 2016-03-21 NOTE — Telephone Encounter (Signed)
Spoke to patient at length regarding our policy at [facility].  He completely agrees with drug screen, he was just unaware as to why we had completed.  As a previous patient of Tabori and Larose Kells, he states he had not ever had DS previously.  Explained our policy at [facility] re: Tiered prescriptions and how this is routine for our office.  He expressed understang.  No additional follow up needed.

## 2016-04-02 ENCOUNTER — Other Ambulatory Visit: Payer: Self-pay | Admitting: Family Medicine

## 2016-04-02 NOTE — Telephone Encounter (Signed)
Rx called in to requested pharmacy 

## 2016-04-02 NOTE — Telephone Encounter (Signed)
Last f/u 12/2015-CPE 

## 2016-04-24 ENCOUNTER — Other Ambulatory Visit: Payer: Self-pay | Admitting: Family Medicine

## 2016-04-25 NOTE — Telephone Encounter (Signed)
Last f/u 12/2015-CPE 

## 2016-04-29 NOTE — Telephone Encounter (Signed)
Rx called in to requested pharmacy 

## 2016-05-06 DIAGNOSIS — L570 Actinic keratosis: Secondary | ICD-10-CM | POA: Diagnosis not present

## 2016-05-06 DIAGNOSIS — L111 Transient acantholytic dermatosis [Grover]: Secondary | ICD-10-CM | POA: Diagnosis not present

## 2016-05-06 DIAGNOSIS — L821 Other seborrheic keratosis: Secondary | ICD-10-CM | POA: Diagnosis not present

## 2016-05-06 DIAGNOSIS — Z85828 Personal history of other malignant neoplasm of skin: Secondary | ICD-10-CM | POA: Diagnosis not present

## 2016-05-06 DIAGNOSIS — C44519 Basal cell carcinoma of skin of other part of trunk: Secondary | ICD-10-CM | POA: Diagnosis not present

## 2016-05-06 DIAGNOSIS — Z85820 Personal history of malignant melanoma of skin: Secondary | ICD-10-CM | POA: Diagnosis not present

## 2016-05-13 ENCOUNTER — Telehealth: Payer: Self-pay

## 2016-05-13 NOTE — Telephone Encounter (Signed)
Ok to send 10?

## 2016-05-13 NOTE — Telephone Encounter (Signed)
Yes ok to send.  

## 2016-05-13 NOTE — Telephone Encounter (Signed)
Pt left v/m; on 05/06/16 and 05/10/16 [facility] was to fax [facility] to verify if verapamil is tab or cap. Pt wants to know status of these faxes; pt is out of med and request 10 pills to [place]. Pt request cb.

## 2016-05-14 ENCOUNTER — Telehealth: Payer: Self-pay

## 2016-05-14 NOTE — Telephone Encounter (Signed)
It should be the verapamil 240 mg SR since he takes it once daily.

## 2016-05-14 NOTE — Telephone Encounter (Signed)
[facility] left v/m to verify which verapamil pt is to take; it comes in 2 forms; the verapamil 240 mg SR or the verapamil SR 240 mg 12 hour pill. Use ref # [id number]. Pt has hx of using verapamil 240 mg SR 12 hour pill.Please advise.

## 2016-05-15 NOTE — Telephone Encounter (Signed)
Attempted to contact pharmacy. Wait time is 43min, Unable to wait while in clinic. Will attempt again while not in clinic

## 2016-05-16 ENCOUNTER — Telehealth: Payer: Self-pay

## 2016-05-16 MED ORDER — VERAPAMIL HCL ER 240 MG PO TBCR: 240.0000 mg | EXTENDED_RELEASE_TABLET | Freq: Every day | ORAL | 0 refills | Status: DC

## 2016-05-16 NOTE — Telephone Encounter (Signed)
Form received requesting clarification. Awaiting sig to fax back to pharmacy

## 2016-05-22 NOTE — Telephone Encounter (Signed)
Pt left v/m requesting call to [facility] at [phone] to clarify if verapamil XR is 12 hr or 24 hr.. Pt request cb when completed. Pt has 5 day supply left.

## 2016-05-23 NOTE — Telephone Encounter (Signed)
Attempted to contact pharmacy; wait time 14min. Form has been faxed multiple times with clarification

## 2016-05-28 MED ORDER — VERAPAMIL HCL ER 240 MG PO TBCR: 240.0000 mg | EXTENDED_RELEASE_TABLET | Freq: Every day | ORAL | 1 refills | Status: DC

## 2016-05-28 MED ORDER — ALPRAZOLAM 0.5 MG PO TABS: ORAL_TABLET | ORAL | 0 refills | Status: DC

## 2016-05-28 MED ORDER — VERAPAMIL HCL ER 240 MG PO TBCR: 240.0000 mg | EXTENDED_RELEASE_TABLET | Freq: Every day | ORAL | 0 refills | Status: DC

## 2016-05-28 NOTE — Addendum Note (Signed)
Addended by: Modena Nunnery on: 05/28/2016 04:23 PM   Modules accepted: Orders

## 2016-05-28 NOTE — Telephone Encounter (Signed)
Spoke to pt who has had multiple discrepancies with receiving his xanax Rx.He has spoken with both Junie Panning and I and is requesting that his Rx be sent to a local pharmacy vs mail order. Hs is going out of town and medication will not be available before his departure. Pt is request that for this month, his xanax be called in to [place] and he will update his contract sometime this week for future refills. Is it ok for me to send this locally?

## 2016-05-28 NOTE — Addendum Note (Signed)
Addended by: Modena Nunnery on: 05/28/2016 02:40 PM   Modules accepted: Orders

## 2016-05-29 ENCOUNTER — Encounter: Payer: Self-pay | Admitting: Family Medicine

## 2016-05-29 ENCOUNTER — Ambulatory Visit (INDEPENDENT_AMBULATORY_CARE_PROVIDER_SITE_OTHER): Payer: Medicare Other

## 2016-05-29 DIAGNOSIS — Z23 Encounter for immunization: Secondary | ICD-10-CM

## 2016-05-29 NOTE — Telephone Encounter (Signed)
Rx called in to requested pharmacy 

## 2016-05-29 NOTE — Telephone Encounter (Signed)
Per discussion with pt on 05/28/16 he has requested that [facility] [place] so that he can pick it up before going out of town.  I spoke with Dr. Deborra Medina regarding this change as [facility] is the pharmacy documented on the narcotics contract and she is ok with [facility].

## 2016-05-30 ENCOUNTER — Telehealth: Payer: Self-pay | Admitting: Family Medicine

## 2016-05-30 ENCOUNTER — Encounter: Payer: Self-pay | Admitting: Family Medicine

## 2016-05-30 ENCOUNTER — Encounter: Payer: Self-pay | Admitting: *Deleted

## 2016-05-30 DIAGNOSIS — Z23 Encounter for immunization: Secondary | ICD-10-CM

## 2016-05-30 DIAGNOSIS — Z79899 Other long term (current) drug therapy: Secondary | ICD-10-CM | POA: Diagnosis not present

## 2016-05-30 NOTE — Telephone Encounter (Signed)
[name] and she confirmed they could fill his Xanax and it would be ready within the hour.  I called [name] and told him.  I also explained that he needs to update his narcotics contract and apologized for the miscommunication and offered to email or fax him the contract for completion.  He said that the office is on his way home from work and would stop by on his way.  I updated the front office staff and the lab so they are aware that he is coming in and needs to update his contract.

## 2016-05-31 NOTE — Telephone Encounter (Signed)
-----   Message from Ellamae Sia sent at 05/30/2016  1:02 PM EDT ----- Regarding: UDS Per this pts request can we talk about rare cases when a pt needs to have a refill sooner,  for example going on vacation? Thanks, terri

## 2016-05-31 NOTE — Telephone Encounter (Signed)
Maybe I misunderstood what happened in this situation but I did not think it had to do with me not being in the office.  I check my charts several times a day.  Was that part of the issue?

## 2016-05-31 NOTE — Telephone Encounter (Signed)
PCP is ARON

## 2016-06-25 ENCOUNTER — Other Ambulatory Visit: Payer: Self-pay | Admitting: Family Medicine

## 2016-07-18 ENCOUNTER — Other Ambulatory Visit: Payer: Self-pay | Admitting: Family Medicine

## 2016-07-18 ENCOUNTER — Encounter: Payer: Self-pay | Admitting: Family Medicine

## 2016-07-18 MED ORDER — ALPRAZOLAM 0.5 MG PO TABS: ORAL_TABLET | ORAL | 0 refills | Status: DC

## 2016-07-18 NOTE — Telephone Encounter (Signed)
Two separate pharmacies listed. Pt updated contract last month to change to [place] to pt and confirmed local pharmacy. Called in as requested

## 2016-07-18 NOTE — Telephone Encounter (Signed)
Last f/u 12/2015 

## 2016-08-18 ENCOUNTER — Other Ambulatory Visit: Payer: Self-pay | Admitting: Family Medicine

## 2016-08-20 NOTE — Telephone Encounter (Signed)
Last f/u 12/2015-CPE 

## 2016-08-20 NOTE — Telephone Encounter (Signed)
Rx called in to requested pharmacy 

## 2016-09-05 ENCOUNTER — Ambulatory Visit: Payer: Medicare Other | Admitting: Internal Medicine

## 2016-09-11 ENCOUNTER — Ambulatory Visit (INDEPENDENT_AMBULATORY_CARE_PROVIDER_SITE_OTHER): Payer: Medicare Other | Admitting: Internal Medicine

## 2016-09-11 ENCOUNTER — Encounter: Payer: Self-pay | Admitting: Internal Medicine

## 2016-09-11 VITALS — BP 112/74 | HR 61 | Ht 72.0 in | Wt 220.6 lb

## 2016-09-11 DIAGNOSIS — I4892 Unspecified atrial flutter: Secondary | ICD-10-CM

## 2016-09-11 NOTE — Progress Notes (Signed)
HPI [name] returns today for followup. He is a pleasant 76 yo man with atrial flutter s/p ablation who developed recurrent flutter over a year out from his initial ablation and underwent repeat ablation 2 years ago. In the interim, he has been stable. He denies chest pain or sob. He has gone back to work. He has some fatigue, especially in the morning and notes 4 cups of coffee. He has very minimal palpitations. He thinks his heart may have beaten irregularly 2 times. Allergies  Allergen Reactions  . Statins Other (See Comments)    Body aches, memory loss     Current Outpatient Prescriptions  Medication Sig Dispense Refill  . ALPRAZolam (XANAX) 0.5 MG tablet TAKE 1 TABLET BY MOUTH AT BEDTIME AS NEEDED FOR ANXIETY 30 tablet 0  . Ascorbic Acid (VITAMIN C) 500 MG CHEW Pt takes 2 chewables daily.    . ASPIRIN EC PO Take by mouth 2 (two) times daily.    . benazepril (LOTENSIN) 40 MG tablet Take 1 tablet by mouth  daily 90 tablet 2  . Cholecalciferol (VITAMIN D3) 2000 UNITS TABS Take by mouth.    . Cyanocobalamin (VITAMIN B-12) 5000 MCG SUBL Place under the tongue.    . fenofibrate 160 MG tablet Take 1 tablet by mouth  daily 90 tablet 3  . Multiple Vitamin (MULTIVITAMIN WITH MINERALS) TABS Take 1 tablet by mouth daily.    . Omega-3 Fatty Acids (FISH OIL) 1200 MG CAPS Take 1 capsule by mouth daily.    . Turmeric 500 MG CAPS Take 1 capsule by mouth daily.    . verapamil (CALAN-SR) 240 MG CR tablet TAKE 1 TABLET (240 MG TOTAL) BY MOUTH AT BEDTIME. 30 tablet 0   No current facility-administered medications for this visit.      Past Medical History:  Diagnosis Date  . Atrial flutter ([place]) 03-2012  . Chronic back pain   . Headache(784.0)    migraines x 3 in life  . Hearing loss   . Hyperlipidemia   . Hypertension   . HYPOGONADISM, MALE 10/12/2007   Followup per urology    . Melanoma ([place])    melanoma left forearm 2005- no chemo  . PROSTATE CANCER, HX OF 10/06/2008   Status post  surgery approximately 2010      ROS:   All systems reviewed and negative except as noted in the HPI.   Past Surgical History:  Procedure Laterality Date  . ABLATION  01-24-2014   repeat CTI ablation by Dr Lovena Le  . ABLATION OF DYSRHYTHMIC FOCUS  10/15/2012   CTI ablation by Dr Lovena Le for atrial flutter  . arhtroscopy  right knee  2010  . ATRIAL FLUTTER ABLATION N/A 10/15/2012   Procedure: ATRIAL FLUTTER ABLATION;  Surgeon: Evans Lance, MD;  Location: [facility] CATH LAB;  Service: Cardiovascular;  Laterality: N/A;  . ATRIAL FLUTTER ABLATION N/A 01/24/2014   Procedure: ATRIAL FLUTTER ABLATION;  Surgeon: Evans Lance, MD;  Location: [facility] CATH LAB;  Service: Cardiovascular;  Laterality: N/A;  . BACK SURGERY  07/2006   lumbar discectomy  . CARDIAC CATHETERIZATION     15 years ago  . CARDIOVERSION  05/18/2012   Procedure: CARDIOVERSION;  Surgeon: Peter M Martinique, MD;  Location: [facility] ENDOSCOPY;  Service: Cardiovascular;  Laterality: N/A;  on eloquis on-going  . COLONOSCOPY W/ POLYPECTOMY    . LUMBAR LAMINECTOMY/DECOMPRESSION MICRODISCECTOMY  08/16/2011   Procedure: LUMBAR LAMINECTOMY/DECOMPRESSION MICRODISCECTOMY;  Surgeon: Dahlia Bailiff;  Location: [facility]  ORS;  Service: Orthopedics;  Laterality: N/A;  Lumbar Decompression and Foraminotomy L4-S1    . MELANOMA EXCISION  12/13/03   [place] [facility]  . PROSTATECTOMY  07/2008  . ROTATOR CUFF REPAIR  2008   right; Dr Justice Britain  . TONSILLECTOMY  1947     Family History  Problem Relation Age of Onset  . Pancreatic cancer Mother   . Esophageal cancer Neg Hx   . Stomach cancer Neg Hx   . Rectal cancer Neg Hx   . Colon cancer Brother      Social History   Social History  . Marital status: Married    Spouse name: N/A  . Number of children: 2  . Years of education: N/A   Occupational History  . estimator for contractor    Social History Main Topics  . Smoking status: Former Smoker    Packs/day: 1.00    Years: 20.00    Types: Cigarettes     Quit date: 08/08/1984  . Smokeless tobacco: Never Used  . Alcohol use 1.2 oz/week    2 Cans of beer per week     Comment: socially   2 beer or mixed drinks week  . Drug use: No  . Sexual activity: No   Other Topics Concern  . Not on file   Social History Narrative   in [place] since '84   Desires CPR     BP 112/74   Pulse 61   Ht 6' (1.829 m)   Wt 220 lb 9.6 oz (100.1 kg)   BMI 29.92 kg/m   Physical Exam:  Well appearing 76 yo man, NAD HEENT: Unremarkable Neck:  No JVD, no thyromegally Back:  No CVA tenderness Lungs:  Clear with no wheezes HEART:  Regular rate rhythm, no murmurs, no rubs, no clicks Abd:  soft, positive bowel sounds, no organomegally, no rebound, no guarding Ext:  2 plus pulses, no edema, no cyanosis, no clubbing Skin:  No rashes no nodules Neuro:  CN II through XII intact, motor grossly intact  EKG - nsr  Assess/Plan: 1. Atrial flutter - he has had no recurrent episodes. 2. HTN - his blood pressure is well controlled.  3. Anxiety - I have encouraged him to use melatonin over the counter to help him get to sleep. 4. Fatigue - I asked him to wean off of the Xanax. Also, I asked him to reduce his caffeine consumption.  Mikle Bosworth.D.

## 2016-09-11 NOTE — Patient Instructions (Signed)
Medication Instructions:  Your physician recommends that you continue on your current medications as directed. Please refer to the Current Medication list given to you today.   Labwork: None Ordered   Testing/Procedures: None Ordered   Follow-Up: Your physician wants you to follow-up in: 2 years with Dr. Lovena Le. You will receive a reminder letter in the mail two months in advance. If you don't receive a letter, please call our office to schedule the follow-up appointment.   Any Other Special Instructions Will Be Listed Below (If Applicable).     If you need a refill on your cardiac medications before your next appointment, please call your pharmacy.

## 2016-09-18 DIAGNOSIS — Z961 Presence of intraocular lens: Secondary | ICD-10-CM | POA: Diagnosis not present

## 2016-09-18 DIAGNOSIS — H524 Presbyopia: Secondary | ICD-10-CM | POA: Diagnosis not present

## 2016-09-18 DIAGNOSIS — H43813 Vitreous degeneration, bilateral: Secondary | ICD-10-CM | POA: Diagnosis not present

## 2016-09-18 DIAGNOSIS — H5203 Hypermetropia, bilateral: Secondary | ICD-10-CM | POA: Diagnosis not present

## 2016-09-18 DIAGNOSIS — H43393 Other vitreous opacities, bilateral: Secondary | ICD-10-CM | POA: Diagnosis not present

## 2016-09-20 ENCOUNTER — Ambulatory Visit: Payer: Medicare Other | Admitting: Cardiology

## 2016-10-04 ENCOUNTER — Other Ambulatory Visit: Payer: Self-pay | Admitting: Family Medicine

## 2016-10-04 MED ORDER — ALPRAZOLAM 0.5 MG PO TABS: ORAL_TABLET | ORAL | 0 refills | Status: DC

## 2016-10-04 NOTE — Telephone Encounter (Signed)
Last f/u 12/2015 

## 2016-10-04 NOTE — Telephone Encounter (Signed)
Rx called in to requested pharmacy 

## 2016-10-22 ENCOUNTER — Telehealth: Payer: Self-pay | Admitting: *Deleted

## 2016-10-22 ENCOUNTER — Other Ambulatory Visit: Payer: Self-pay | Admitting: Family Medicine

## 2016-10-22 NOTE — Telephone Encounter (Signed)
PT sent in the following message via MyChart. Please advise.   Appointment Request From: Darrick Penna. Yves Dill    With Provider: Arnette Norris, MD [facility] [place]    Preferred Date Range: From 11/04/2016 To 11/08/2016    Preferred Times: Any    Reason for visit: Office Visit    Comments:  I gave blood on Feb. 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 I was on a mission trip to [place] about six years ago and must have gotten the parasite then. One of the results of the disease could be heart arrhythmia. I have had this happen to me and have had two heart ablations for this. First one didn't penetrate deep enough and was redone. about two years ago. This one was successful and cardiologist recent checkup, Jan 24th, 2018 said I was in great shape. I am going out of town Thursday, March 8th and won't be back until Saturday March 18th. Would like to schedule an appointment to have this checked when I return.

## 2016-10-22 NOTE — Telephone Encounter (Signed)
Yes please schedule an appointment for when he returns.

## 2016-10-23 ENCOUNTER — Encounter: Payer: Self-pay | Admitting: Internal Medicine

## 2016-10-23 ENCOUNTER — Telehealth: Payer: Self-pay

## 2016-10-23 ENCOUNTER — Ambulatory Visit (INDEPENDENT_AMBULATORY_CARE_PROVIDER_SITE_OTHER): Payer: Medicare Other | Admitting: Internal Medicine

## 2016-10-23 VITALS — BP 114/60 | HR 51 | Temp 97.8°F | Wt 217.0 lb

## 2016-10-23 DIAGNOSIS — B571 Acute Chagas' disease without heart involvement: Secondary | ICD-10-CM | POA: Diagnosis not present

## 2016-10-23 NOTE — Telephone Encounter (Signed)
Pt wanted to know if he was contagious?  Best number to call  [phone]

## 2016-10-23 NOTE — Telephone Encounter (Signed)
Pt was seen by Rollene Fare today and requested refill on Xanax as he is going out of town next week when it will be due--- last filled 10/04/16--please advise

## 2016-10-23 NOTE — Telephone Encounter (Signed)
Based off the information I read, not unless he has blood to blood contact with someone else.

## 2016-10-23 NOTE — Progress Notes (Signed)
Subjective:    Patient ID: [name], male    DOB: 1940/09/09, 76 y.o.   MRN: [id number]  HPI  Pt presents to the clinic today with c/o parasites in his blood. He reports he donated blood 09/21/16. 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 He feels like he may have contracted this 6 years ago while on a missions trip to [place]. He has not travelled outside the country since that time.   Review of Systems      Past Medical History:  Diagnosis Date  . Atrial flutter ([place]) 03-2012  . Chronic back pain   . Headache(784.0)    migraines x 3 in life  . Hearing loss   . Hyperlipidemia   . Hypertension   . HYPOGONADISM, MALE 10/12/2007   Followup per urology    . Melanoma ([place])    melanoma left forearm 2005- no chemo  . PROSTATE CANCER, HX OF 10/06/2008   Status post surgery approximately 2010      Current Outpatient Prescriptions  Medication Sig Dispense Refill  . ALPRAZolam (XANAX) 0.5 MG tablet TAKE 1 TABLET BY MOUTH AT BEDTIME AS NEEDED FOR ANXIETY 30 tablet 0  . Ascorbic Acid (VITAMIN C) 500 MG CHEW Pt takes 2 chewables daily.    . ASPIRIN EC PO Take by mouth 2 (two) times daily.    . benazepril (LOTENSIN) 40 MG tablet Take 1 tablet by mouth  daily 90 tablet 2  . Cholecalciferol (VITAMIN D3) 2000 UNITS TABS Take by mouth.    . Cyanocobalamin (VITAMIN B-12) 5000 MCG SUBL Place under the tongue.    . fenofibrate 160 MG tablet Take 1 tablet by mouth  daily 90 tablet 3  . Multiple Vitamin (MULTIVITAMIN WITH MINERALS) TABS Take 1 tablet by mouth daily.    . Omega-3 Fatty Acids (FISH OIL) 1200 MG CAPS Take 1 capsule by mouth daily.    . Turmeric 500 MG CAPS Take 1 capsule by mouth daily.    . verapamil (CALAN-SR) 240 MG CR tablet TAKE 1 TABLET (240 MG TOTAL) BY MOUTH AT BEDTIME. 30 tablet 0  . verapamil (CALAN-SR) 240 MG CR tablet TAKE 1 TABLET BY MOUTH AT  BEDTIME 90 tablet 0     No current facility-administered medications for this visit.     Allergies  Allergen Reactions  . Statins Other (See Comments)    Body aches, memory loss    Family History  Problem Relation Age of Onset  . Pancreatic cancer Mother   . Esophageal cancer Neg Hx   . Stomach cancer Neg Hx   . Rectal cancer Neg Hx   . Colon cancer Brother     Social History   Social History  . Marital status: Married    Spouse name: N/A  . Number of children: 2  . Years of education: N/A   Occupational History  . estimator for contractor    Social History Main Topics  . Smoking status: Former Smoker    Packs/day: 1.00    Years: 20.00    Types: Cigarettes    Quit date: 08/08/1984  . Smokeless tobacco: Never Used  . Alcohol use 1.2 oz/week    2 Cans of beer per week     Comment: socially   2 beer or mixed drinks week  . Drug use: No  . Sexual activity: No   Other Topics Concern  .  Not on file   Social History Narrative   in [place] since '84   Desires CPR     Constitutional: Denies fever, malaise, fatigue, headache or abrupt weight changes.   No other specific complaints in a complete review of systems (except as listed in HPI above).  Objective:   Physical Exam  BP 114/60   Pulse (!) 51   Temp 97.8 F (36.6 C) (Oral)   Wt 217 lb (98.4 kg)   SpO2 97%   BMI 29.43 kg/m  Wt Readings from Last 3 Encounters:  10/23/16 217 lb (98.4 kg)  09/11/16 220 lb 9.6 oz (100.1 kg)  01/11/16 211 lb (95.7 kg)    General: Appears his stated age, well developed, well nourished in NAD.   BMET    Component Value Date/Time   NA 140 01/09/2016 0914   K 4.2 01/09/2016 0914   CL 107 01/09/2016 0914   CO2 26 01/09/2016 0914   GLUCOSE 109 (H) 01/09/2016 0914   GLUCOSE 111 April 22, 202010 1425   BUN 19 01/09/2016 0914   CREATININE 1.25 01/09/2016 0914   CALCIUM 8.9 01/09/2016 0914   GFRNONAA 49 (L) 08/09/2011 1410   GFRAA 57 (L) 08/09/2011 1410    Lipid Panel     Component Value  Date/Time   CHOL 203 (H) 01/09/2016 0914   TRIG 151.0 (H) 01/09/2016 0914   HDL 38.40 (L) 01/09/2016 0914   CHOLHDL 5 01/09/2016 0914   VLDL 30.2 01/09/2016 0914   LDLCALC 134 (H) 01/09/2016 0914    CBC    Component Value Date/Time   WBC 5.7 01/09/2016 0914   RBC 5.13 01/09/2016 0914   HGB 15.4 01/09/2016 0914   HCT 45.2 01/09/2016 0914   PLT 198.0 01/09/2016 0914   PLT 164 April 22, 202010 1425   MCV 88.2 01/09/2016 0914   MCH 30.6 08/09/2011 1410   MCHC 34.1 01/09/2016 0914   RDW 13.6 01/09/2016 0914   LYMPHSABS 1.6 01/09/2016 0914   MONOABS 0.4 01/09/2016 0914   EOSABS 0.2 01/09/2016 0914   BASOSABS 0.0 01/09/2016 0914    Hgb A1C Lab Results  Component Value Date   HGBA1C 5.9 01/05/2015            Assessment & Plan:   Trypanosoma Cruzi Infection:  Referral placed to ID- see Rosaria Ferries on the way out to schedule  Follow up with PCP as previously scheduled Webb Silversmith, NP

## 2016-10-23 NOTE — Telephone Encounter (Signed)
It looks like PT called to schedule an appointment for October 23, 2016.

## 2016-10-23 NOTE — Telephone Encounter (Signed)
Ok to refill one month only.

## 2016-10-23 NOTE — Telephone Encounter (Signed)
Pt is aware as insturcted

## 2016-10-24 MED ORDER — ALPRAZOLAM 0.5 MG PO TABS: ORAL_TABLET | ORAL | 0 refills | Status: DC

## 2016-11-08 DIAGNOSIS — L812 Freckles: Secondary | ICD-10-CM | POA: Diagnosis not present

## 2016-11-08 DIAGNOSIS — L821 Other seborrheic keratosis: Secondary | ICD-10-CM | POA: Diagnosis not present

## 2016-11-08 DIAGNOSIS — Z85828 Personal history of other malignant neoplasm of skin: Secondary | ICD-10-CM | POA: Diagnosis not present

## 2016-11-08 DIAGNOSIS — L570 Actinic keratosis: Secondary | ICD-10-CM | POA: Diagnosis not present

## 2016-11-08 DIAGNOSIS — Z85820 Personal history of malignant melanoma of skin: Secondary | ICD-10-CM | POA: Diagnosis not present

## 2016-11-11 ENCOUNTER — Encounter: Payer: Self-pay | Admitting: Infectious Diseases

## 2016-11-11 DIAGNOSIS — B571 Acute Chagas' disease without heart involvement: Secondary | ICD-10-CM | POA: Diagnosis not present

## 2016-11-11 DIAGNOSIS — B572 Chagas' disease (chronic) with heart involvement: Secondary | ICD-10-CM | POA: Diagnosis not present

## 2016-11-11 DIAGNOSIS — E781 Pure hyperglyceridemia: Secondary | ICD-10-CM | POA: Diagnosis not present

## 2016-11-11 DIAGNOSIS — I10 Essential (primary) hypertension: Secondary | ICD-10-CM | POA: Diagnosis not present

## 2016-11-11 DIAGNOSIS — I4891 Unspecified atrial fibrillation: Secondary | ICD-10-CM | POA: Diagnosis not present

## 2016-11-11 NOTE — Progress Notes (Signed)
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  He is being further evaluated for actual disease but given his cardiac history I was wondering if you would mind doing an Echo to eval for dilated CM.  I have asked him to call you office to schedule follow up Sincerely Waunita Schooner

## 2016-11-13 ENCOUNTER — Other Ambulatory Visit: Payer: Self-pay

## 2016-11-13 DIAGNOSIS — B571 Acute Chagas' disease without heart involvement: Secondary | ICD-10-CM

## 2016-11-28 ENCOUNTER — Ambulatory Visit (HOSPITAL_COMMUNITY): Payer: Medicare Other | Attending: Internal Medicine

## 2016-11-28 ENCOUNTER — Other Ambulatory Visit: Payer: Self-pay

## 2016-11-28 DIAGNOSIS — B571 Acute Chagas' disease without heart involvement: Secondary | ICD-10-CM

## 2016-12-06 ENCOUNTER — Telehealth: Payer: Self-pay | Admitting: Internal Medicine

## 2016-12-06 NOTE — Telephone Encounter (Signed)
Informed pt echo not reviewed by Dr. Lovena Le yet.  Given preliminary results.  Informed that I would forward to Dr. Lovena Le to review.

## 2016-12-06 NOTE — Telephone Encounter (Signed)
New Message   pt verbalized that he is returning call for rn   For echo results

## 2016-12-10 ENCOUNTER — Other Ambulatory Visit: Payer: Self-pay | Admitting: Family Medicine

## 2016-12-10 NOTE — Telephone Encounter (Signed)
Received refill electronically Last office visit 10/23/16/acute Last refill 01/11/16 #90/3 See drug interaction with Pravastatin

## 2016-12-10 NOTE — Telephone Encounter (Signed)
Message given to [facility] to schedule appointment as instructed.

## 2016-12-10 NOTE — Telephone Encounter (Signed)
Heart pumping function is unchanged from prior echo. There is no evidence of Chagas disease in the heart. GT

## 2016-12-10 NOTE — Telephone Encounter (Signed)
Ok to give 90 day supply of Fenofibrate, no further refills until seen for [employer]

## 2016-12-11 NOTE — Telephone Encounter (Signed)
Informed patient  Patient verbalized understanding  

## 2016-12-18 ENCOUNTER — Other Ambulatory Visit: Payer: Self-pay | Admitting: Family Medicine

## 2016-12-24 ENCOUNTER — Other Ambulatory Visit: Payer: Self-pay | Admitting: Family Medicine

## 2016-12-24 NOTE — Telephone Encounter (Signed)
Electronic refill request.  CPE and AMW scheduled 01/14/17 Last Filled:    30 tablet 0 10/24/2016  Please advise.

## 2016-12-25 MED ORDER — ALPRAZOLAM 0.5 MG PO TABS: ORAL_TABLET | ORAL | 0 refills | Status: DC

## 2016-12-25 NOTE — Telephone Encounter (Signed)
Rx called to pharmacy as instructed. 

## 2017-01-14 ENCOUNTER — Encounter: Payer: Self-pay | Admitting: Family Medicine

## 2017-01-14 ENCOUNTER — Ambulatory Visit (INDEPENDENT_AMBULATORY_CARE_PROVIDER_SITE_OTHER): Payer: Medicare Other | Admitting: Family Medicine

## 2017-01-14 ENCOUNTER — Ambulatory Visit (INDEPENDENT_AMBULATORY_CARE_PROVIDER_SITE_OTHER): Payer: Medicare Other

## 2017-01-14 ENCOUNTER — Encounter: Payer: Self-pay | Admitting: Radiology

## 2017-01-14 VITALS — BP 130/80 | HR 82 | Ht 71.5 in | Wt 211.0 lb

## 2017-01-14 DIAGNOSIS — I10 Essential (primary) hypertension: Secondary | ICD-10-CM | POA: Diagnosis not present

## 2017-01-14 DIAGNOSIS — Z0000 Encounter for general adult medical examination without abnormal findings: Secondary | ICD-10-CM

## 2017-01-14 DIAGNOSIS — I4892 Unspecified atrial flutter: Secondary | ICD-10-CM | POA: Diagnosis not present

## 2017-01-14 DIAGNOSIS — E785 Hyperlipidemia, unspecified: Secondary | ICD-10-CM | POA: Diagnosis not present

## 2017-01-14 DIAGNOSIS — R8299 Other abnormal findings in urine: Secondary | ICD-10-CM

## 2017-01-14 DIAGNOSIS — Z8546 Personal history of malignant neoplasm of prostate: Secondary | ICD-10-CM | POA: Diagnosis not present

## 2017-01-14 DIAGNOSIS — R82998 Other abnormal findings in urine: Secondary | ICD-10-CM | POA: Insufficient documentation

## 2017-01-14 DIAGNOSIS — G4700 Insomnia, unspecified: Secondary | ICD-10-CM | POA: Diagnosis not present

## 2017-01-14 LAB — URINALYSIS
BILIRUBIN URINE: NEGATIVE
Hgb urine dipstick: NEGATIVE
Ketones, ur: NEGATIVE
Leukocytes, UA: NEGATIVE
NITRITE: NEGATIVE
PH: 6 (ref 5.0–8.0)
Specific Gravity, Urine: >=1.03 — AB (ref 1.000–1.030)
TOTAL PROTEIN, URINE-UPE24: NEGATIVE
URINE GLUCOSE: NEGATIVE
Urobilinogen, UA: 0.2 (ref 0.0–1.0)

## 2017-01-14 LAB — CBC WITH DIFFERENTIAL/PLATELET
BASOS ABS: 0 10*3/uL (ref 0.0–0.1)
Basophils Relative: 0.8 % (ref 0.0–3.0)
EOS PCT: 4 % (ref 0.0–5.0)
Eosinophils Absolute: 0.2 10*3/uL (ref 0.0–0.7)
HCT: 46 % (ref 39.0–52.0)
HEMOGLOBIN: 15.6 g/dL (ref 13.0–17.0)
Lymphocytes Relative: 33.4 % (ref 12.0–46.0)
Lymphs Abs: 1.5 10*3/uL (ref 0.7–4.0)
MCHC: 34 g/dL (ref 30.0–36.0)
MCV: 88.6 fl (ref 78.0–100.0)
MONO ABS: 0.4 10*3/uL (ref 0.1–1.0)
Monocytes Relative: 9.6 % (ref 3.0–12.0)
Neutro Abs: 2.4 10*3/uL (ref 1.4–7.7)
Neutrophils Relative %: 52.2 % (ref 43.0–77.0)
Platelets: 200 10*3/uL (ref 150.0–400.0)
RBC: 5.19 Mil/uL (ref 4.22–5.81)
RDW: 14 % (ref 11.5–15.5)
WBC: 4.6 10*3/uL (ref 4.0–10.5)

## 2017-01-14 LAB — COMPREHENSIVE METABOLIC PANEL
ALBUMIN: 4.1 g/dL (ref 3.5–5.2)
ALT: 17 U/L (ref 0–53)
AST: 24 U/L (ref 0–37)
Alkaline Phosphatase: 37 U/L — ABNORMAL LOW (ref 39–117)
BUN: 25 mg/dL — ABNORMAL HIGH (ref 6–23)
CALCIUM: 9 mg/dL (ref 8.4–10.5)
CHLORIDE: 109 meq/L (ref 96–112)
CO2: 24 meq/L (ref 19–32)
Creatinine, Ser: 1.45 mg/dL (ref 0.40–1.50)
GFR: 50.3 mL/min — AB (ref >60.00)
GLUCOSE: 120 mg/dL — AB (ref 70–99)
Potassium: 4 mEq/L (ref 3.5–5.1)
SODIUM: 140 meq/L (ref 135–145)
TOTAL PROTEIN: 6.8 g/dL (ref 6.0–8.3)
Total Bilirubin: 0.5 mg/dL (ref 0.2–1.2)

## 2017-01-14 LAB — LIPID PANEL
CHOL/HDL RATIO: 5
Cholesterol: 186 mg/dL (ref 0–200)
HDL: 40.7 mg/dL (ref >39.00)
LDL CALC: 119 mg/dL — AB (ref 0–99)
NonHDL: 145.51
TRIGLYCERIDES: 131 mg/dL (ref 0.0–149.0)
VLDL: 26.2 mg/dL (ref 0.0–40.0)

## 2017-01-14 LAB — URINALYSIS, MICROSCOPIC ONLY

## 2017-01-14 NOTE — Progress Notes (Signed)
Pre visit review using our clinic review tool, if applicable. No additional management support is needed unless otherwise documented below in the visit note. 

## 2017-01-14 NOTE — Assessment & Plan Note (Signed)
Asymptomatic s/p remote h/o ablation.

## 2017-01-14 NOTE — Progress Notes (Signed)
Subjective:   Patient ID: [name], male    DOB: [birth year], 76 y.o.   MRN: [id number]  [name] is a pleasant 76 y.o. year old male who presents to clinic today with Annual Exam  and follow up of chronic medical conditions on 01/14/2017  HPI:  Annual medicare wellness visit scheduled with Lindell Noe, RN later today.  Colonoscopy 05/06/13 Zoster 10/09/07 Tdap 03/22/15 Pneumovax 08/20/07 Prevnar 13 01/03/14 Influenza vaccine 05/30/16      Insomnia- has been taking xanax at bedtime for years.  HLD- taking pravachol 20 mg daily and fenofibrate 160 mg daily.  Lab Results  Component Value Date   CHOL 203 (H) 01/09/2016   HDL 38.40 (L) 01/09/2016   LDLCALC 134 (H) 01/09/2016   LDLDIRECT 137.9 12/30/2012   TRIG 151.0 (H) 01/09/2016   CHOLHDL 5 01/09/2016   Lab Results  Component Value Date   ALT 16 01/09/2016   AST 20 01/09/2016   ALKPHOS 41 01/09/2016   BILITOT 0.7 01/09/2016   HTN- has been well controlled on Benazapril 40 mg daily and Verapamil daily. Denies HA, blurred vision, CP or SOB. No LE edema.  Lab Results  Component Value Date   CREATININE 1.25 01/09/2016   A flutter- s/ p ablation. Taking ASA- two 81 mg tablets daily.  H/o prostate CA- sees urology. Recent PSA was zero per pt.  No increased frequency of nocturia. He does notice that his urine is a bit darker.  Requesting UA today. No dysuria, back pain, fever, chills, nausea or vomiting. Lab Results  Component Value Date   PSA 0.01 (L) 01/09/2016   PSA 8.82 (H) 06/06/2008    Current Outpatient Prescriptions on File Prior to Visit  Medication Sig Dispense Refill  . ALPRAZolam (XANAX) 0.5 MG tablet TAKE 1 TABLET BY MOUTH AT BEDTIME AS NEEDED FOR ANXIETY 30 tablet 0  . Ascorbic Acid (VITAMIN C) 500 MG CHEW Pt takes 2 chewables daily.    . ASPIRIN EC PO Take by mouth 2 (two) times daily.    . benazepril (LOTENSIN) 40 MG tablet TAKE 1 TABLET BY MOUTH  DAILY 90 tablet 0  . Cholecalciferol  (VITAMIN D3) 2000 UNITS TABS Take by mouth.    . Cyanocobalamin (VITAMIN B-12) 5000 MCG SUBL Place under the tongue.    . fenofibrate 160 MG tablet TAKE 1 TABLET BY MOUTH  DAILY 90 tablet 0  . Multiple Vitamin (MULTIVITAMIN WITH MINERALS) TABS Take 1 tablet by mouth daily.    . naproxen sodium (ALEVE) 220 MG tablet Take 1 tablet by mouth daily.    . Omega-3 Fatty Acids (FISH OIL) 1200 MG CAPS Take 1 capsule by mouth daily.    . Turmeric 500 MG CAPS Take 1 capsule by mouth daily.    . verapamil (CALAN-SR) 240 MG CR tablet TAKE 1 TABLET BY MOUTH AT  BEDTIME 90 tablet 0  . [DISCONTINUED] pravastatin (PRAVACHOL) 20 MG tablet Take 1 tablet (20 mg total) by mouth daily. 30 tablet 6   No current facility-administered medications on file prior to visit.     Allergies  Allergen Reactions  . Statins Other (See Comments)    Body aches, memory loss    Past Medical History:  Diagnosis Date  . Atrial flutter ([place]) 03-2012  . Chronic back pain   . Headache(784.0)    migraines x 3 in life  . Hearing loss   . Hyperlipidemia   . Hypertension   . HYPOGONADISM, MALE 10/12/2007  Followup per urology    . Melanoma ([place])    melanoma left forearm 2005- no chemo  . PROSTATE CANCER, HX OF 10/06/2008   Status post surgery approximately 2010      Past Surgical History:  Procedure Laterality Date  . ABLATION  01-24-2014   repeat CTI ablation by Dr Lovena Le  . ABLATION OF DYSRHYTHMIC FOCUS  10/15/2012   CTI ablation by Dr Lovena Le for atrial flutter  . arhtroscopy  right knee  2010  . ATRIAL FLUTTER ABLATION N/A 10/15/2012   Procedure: ATRIAL FLUTTER ABLATION;  Surgeon: Evans Lance, MD;  Location[facility] CATH LAB;  Service: Cardiovascular;  Laterality: N/A;  . ATRIAL FLUTTER ABLATION N/A 01/24/2014   Procedure: ATRIAL FLUTTER ABLATION;  Surgeon: Evans Lance, MD;  Location: [facility] CATH LAB;  Service: Cardiovascular;  Laterality: N/A;  . BACK SURGERY  07/2006   lumbar discectomy  . CARDIAC CATHETERIZATION     15  years ago  . CARDIOVERSION  05/18/2012   Procedure: CARDIOVERSION;  Surgeon: Peter M Martinique, MD;  [facility] ENDOSCOPY;  Service: Cardiovascular;  Laterality: N/A;  on eloquis on-going  . COLONOSCOPY W/ POLYPECTOMY    . LUMBAR LAMINECTOMY/DECOMPRESSION MICRODISCECTOMY  08/16/2011   Procedure: LUMBAR LAMINECTOMY/DECOMPRESSION MICRODISCECTOMY;  Surgeon: Dahlia Bailiff;  Location: [facility] ORS;  Service: Orthopedics;  Laterality: N/A;  Lumbar Decompression and Foraminotomy L4-S1    . MELANOMA EXCISION  12/13/03   [place] [facility]  . PROSTATECTOMY  07/2008  . ROTATOR CUFF REPAIR  2008   right; Dr Justice Britain  . TONSILLECTOMY  1947    Family History  Problem Relation Age of Onset  . Pancreatic cancer Mother   . Colon cancer Brother   . Esophageal cancer Neg Hx   . Stomach cancer Neg Hx   . Rectal cancer Neg Hx     Social History   Social History  . Marital status: Married    Spouse name: N/A  . Number of children: 2  . Years of education: N/A   Occupational History  . estimator for contractor    Social History Main Topics  . Smoking status: Former Smoker    Packs/day: 1.00    Years: 20.00    Types: Cigarettes    Quit date: 08/08/1984  . Smokeless tobacco: Never Used  . Alcohol use 1.2 oz/week    2 Cans of beer per week     Comment: socially   2 beer or mixed drinks week  . Drug use: No  . Sexual activity: No   Other Topics Concern  . Not on file   Social History Narrative   in [place] since '84   Desires CPR   The PMH, [place], Social History, Family History, Medications, and allergies have been reviewed in [facility], and have been updated if relevant.    Review of Systems  Constitutional: Negative.   HENT: Negative.   Respiratory: Negative.   Cardiovascular: Negative.   Gastrointestinal: Negative.   Endocrine: Negative.   Genitourinary: Negative.        + darker color of urine  Musculoskeletal: Negative.   Skin: Negative.   Allergic/Immunologic: Negative.     Neurological: Negative.   Hematological: Negative.   Psychiatric/Behavioral: Negative.   All other systems reviewed and are negative.      Objective:    BP 130/80   Pulse 82   Ht 5' 11.5" (1.816 m)   Wt 211 lb (95.7 kg)   SpO2 98%   BMI 29.02 kg/m  Physical Exam  General:  pleasant male in no acute distress Eyes:  PERRL Ears:  External ear exam shows no significant lesions or deformities.  TMs normal bilaterally Hearing is grossly normal bilaterally. Nose:  External nasal examination shows no deformity or inflammation. Nasal mucosa are pink and moist without lesions or exudates. Mouth:  Oral mucosa and oropharynx without lesions or exudates.  Teeth in good repair. Neck:  no carotid bruit or thyromegaly no cervical or supraclavicular lymphadenopathy  Lungs:  Normal respiratory effort, chest expands symmetrically. Lungs are clear to auscultation, no crackles or wheezes. Heart:  Normal rate and regular rhythm. S1 and S2 normal without gallop, murmur, click, rub or other extra sounds. Abdomen:  Bowel sounds positive,abdomen soft and non-tender without masses, organomegaly or hernias noted. Pulses:  R and L posterior tibial pulses are full and equal bilaterally  Extremities:  no edema  Psych:  Good eye contact, not anxious or depressed appearing       Assessment & Plan:   Visit for well man health check - Plan: CBC with Differential/Platelet, Comprehensive metabolic panel  Hyperlipidemia, unspecified hyperlipidemia type - Plan: Lipid panel  Atrial flutter, unspecified type ([place])  PROSTATE CANCER, HX OF - Plan: CANCELED: PSA  Essential hypertension  Insomnia, unspecified type - Plan: Drug Screen, Urine  Dark urine - Plan: U/A WITH MICRO (81001) No Follow-up on file.

## 2017-01-14 NOTE — Assessment & Plan Note (Signed)
Advised to push fluids. Has no other urinary symptoms. Will check UA today for patient reassurance.

## 2017-01-14 NOTE — Addendum Note (Signed)
Addended by: Ellamae Sia on: 01/14/2017 10:15 AM   Modules accepted: Orders, SmartSet

## 2017-01-14 NOTE — Progress Notes (Signed)
Subjective:   [name] is a 76 y.o. male who presents for Medicare Annual/Subsequent preventive examination.  Review of Systems:  N/A Cardiac Risk Factors include: advanced age (>37men, >17 women);male gender;dyslipidemia;hypertension;obesity (BMI >30kg/m2)     Objective:    Vitals: BP 130/80   Pulse 82   Ht 5' 11.5" (1.816 m)   Wt 211 lb (95.7 kg)   SpO2 98%   BMI 29.02 kg/m   Body mass index is 29.02 kg/m.  Tobacco History  Smoking Status  . Former Smoker  . Packs/day: 1.00  . Years: 20.00  . Types: Cigarettes  . Quit date: 08/08/1984  Smokeless Tobacco  . Never Used     Counseling given: No   Past Medical History:  Diagnosis Date  . Atrial flutter ([place]) 03-2012  . Chronic back pain   . Headache(784.0)    migraines x 3 in life  . Hearing loss   . Hyperlipidemia   . Hypertension   . HYPOGONADISM, MALE 10/12/2007   Followup per urology    . Melanoma ([place])    melanoma left forearm 2005- no chemo  . PROSTATE CANCER, HX OF 10/06/2008   Status post surgery approximately 2010     Past Surgical History:  Procedure Laterality Date  . ABLATION  01-24-2014   repeat CTI ablation by Dr Lovena Le  . ABLATION OF DYSRHYTHMIC FOCUS  10/15/2012   CTI ablation by Dr Lovena Le for atrial flutter  . arhtroscopy  right knee  2010  . ATRIAL FLUTTER ABLATION N/A 10/15/2012   Procedure: ATRIAL FLUTTER ABLATION;  Surgeon: Evans Lance, MD;  Location: [facility] CATH LAB;  Service: Cardiovascular;  Laterality: N/A;  . ATRIAL FLUTTER ABLATION N/A 01/24/2014   Procedure: ATRIAL FLUTTER ABLATION;  Surgeon: Evans Lance, MD;  Location: [facility] CATH LAB;  Service: Cardiovascular;  Laterality: N/A;  . BACK SURGERY  07/2006   lumbar discectomy  . CARDIAC CATHETERIZATION     15 years ago  . CARDIOVERSION  05/18/2012   Procedure: CARDIOVERSION;  Surgeon: Peter M Martinique, MD;  Location: [facility] ENDOSCOPY;  Service: Cardiovascular;  Laterality: N/A;  on eloquis on-going  . COLONOSCOPY W/ POLYPECTOMY    .  LUMBAR LAMINECTOMY/DECOMPRESSION MICRODISCECTOMY  08/16/2011   Procedure: LUMBAR LAMINECTOMY/DECOMPRESSION MICRODISCECTOMY;  Surgeon: Dahlia Bailiff;  Location: [facility] ORS;  Service: Orthopedics;  Laterality: N/A;  Lumbar Decompression and Foraminotomy L4-S1    . MELANOMA EXCISION  12/13/03   [place] [facility]  . PROSTATECTOMY  07/2008  . ROTATOR CUFF REPAIR  2008   right; Dr Justice Britain  . TONSILLECTOMY  1947   Family History  Problem Relation Age of Onset  . Pancreatic cancer Mother   . Colon cancer Brother   . Esophageal cancer Neg Hx   . Stomach cancer Neg Hx   . Rectal cancer Neg Hx    History  Sexual Activity  . Sexual activity: No    Outpatient Encounter Prescriptions as of 01/14/2017  Medication Sig  . ALPRAZolam (XANAX) 0.5 MG tablet TAKE 1 TABLET BY MOUTH AT BEDTIME AS NEEDED FOR ANXIETY  . Ascorbic Acid (VITAMIN C) 500 MG CHEW Pt takes 2 chewables daily.  . ASPIRIN EC PO Take by mouth 2 (two) times daily.  . benazepril (LOTENSIN) 40 MG tablet TAKE 1 TABLET BY MOUTH  DAILY  . Cholecalciferol (VITAMIN D3) 2000 UNITS TABS Take by mouth.  . Cyanocobalamin (VITAMIN B-12) 5000 MCG SUBL Place under the tongue.  . fenofibrate 160 MG tablet TAKE 1 TABLET  BY MOUTH  DAILY  . Multiple Vitamin (MULTIVITAMIN WITH MINERALS) TABS Take 1 tablet by mouth daily.  . naproxen sodium (ALEVE) 220 MG tablet Take 1 tablet by mouth daily.  . Omega-3 Fatty Acids (FISH OIL) 1200 MG CAPS Take 1 capsule by mouth daily.  . Turmeric 500 MG CAPS Take 1 capsule by mouth daily.  . verapamil (CALAN-SR) 240 MG CR tablet TAKE 1 TABLET BY MOUTH AT  BEDTIME  . [DISCONTINUED] verapamil (CALAN-SR) 240 MG CR tablet TAKE 1 TABLET (240 MG TOTAL) BY MOUTH AT BEDTIME.  . [DISCONTINUED] verapamil (CALAN-SR) 240 MG CR tablet TAKE 1 TABLET BY MOUTH AT  BEDTIME   No facility-administered encounter medications on file as of 01/14/2017.     Activities of Daily Living In your present state of health, do you have any  difficulty performing the following activities: 01/14/2017  Hearing? Y  Vision? N  Difficulty concentrating or making decisions? N  Walking or climbing stairs? N  Dressing or bathing? N  Doing errands, shopping? N  Preparing Food and eating ? N  Using the Toilet? N  In the past six months, have you accidently leaked urine? Y  Do you have problems with loss of bowel control? N  Managing your Medications? N  Managing your Finances? N  Housekeeping or managing your Housekeeping? N  Some recent data might be hidden    Patient Care Team: Lucille Passy, MD as PCP - General (Family Medicine) Jarome Matin, MD as Consulting Physician (Dermatology) Griselda Miner, MD as Consulting Physician (Dermatology) Suella Broad, MD as Consulting Physician (Physical Medicine and Rehabilitation) Rana Snare, MD as Consulting Physician (Urology) Melina Schools, MD as Consulting Physician (Orthopedic Surgery) Justice Britain, MD as Consulting Physician (Orthopedic Surgery) Ladene Artist, MD as Consulting Physician (Gastroenterology) Elsie Saas, MD as Consulting Physician (Orthopedic Surgery) Shawnie Dapper, DO as Consulting Physician (Ophthalmology) Warden Fillers, MD as Consulting Physician (Ophthalmology)   Assessment:     Hearing Screening   125Hz  250Hz  500Hz  1000Hz  2000Hz  3000Hz  4000Hz  6000Hz  8000Hz   Right ear:   40 40 40  0    Left ear:   40 40 40  0    Vision Screening Comments: Last vision exam approx. 6 mths ago @ [facility] in [place] and Dietary recommendations Current Exercise Habits: Home exercise routine, Type of exercise: Other - see comments;strength training/weights (recumbent bike), Time (Minutes): 60, Frequency (Times/Week): 3, Weekly Exercise (Minutes/Week): 180, Intensity: Moderate, Exercise limited by: None identified  Goals    . Increase physical activity          Starting 01/14/17, I will continue to exercise for 60 min 3 days per  week.       Fall Risk Fall Risk  01/14/2017 01/14/2017 01/11/2016 01/11/2016 03/08/2015  Falls in the past year? No No No No No   Depression Screen PHQ 2/9 Scores 01/14/2017 01/14/2017 01/14/2017 01/11/2016  PHQ - 2 Score 0 0 0 0    Cognitive Function MMSE - Mini Mental State Exam 01/14/2017  Orientation to time 5  Orientation to Place 5  Registration 3  Attention/ Calculation 0  Recall 2  Recall-comments pt was unable to recall 1 of 3 words  Language- name 2 objects 0  Language- repeat 1  Language- follow 3 step command 3  Language- read & follow direction 0  Write a sentence 0  Copy design 0  Total score 19       PLEASE NOTE: A  Mini-Cog screen was completed. Maximum score is 20. A value of 0 denotes this part of [place] MMSE was not completed or the patient failed this part of the Mini-Cog screening.   Mini-Cog Screening Orientation to Time - Max 5 pts Orientation to Place - Max 5 pts Registration - Max 3 pts Recall - Max 3 pts Language Repeat - Max 1 pts Language Follow 3 Step Command - Max 3 pts   Immunization History  Administered Date(s) Administered  . Influenza Split 05/16/2011, 06/25/2012  . Influenza Whole 05/26/2009  . Influenza, High Dose Seasonal PF 05/26/2013, 05/18/2015  . Influenza,inj,Quad PF,36+ Mos 05/24/2014, 05/30/2016  . Pneumococcal Conjugate-13 01/03/2014  . Pneumococcal Polysaccharide-23 08/20/2007  . Td 08/19/1989, 10/09/2007  . Tdap 03/22/2015  . Zoster 10/09/2007   Screening Tests Health Maintenance  Topic Date Due  . INFLUENZA VACCINE  03/19/2017  . COLONOSCOPY  05/06/2018  . TETANUS/TDAP  03/21/2025  . PNA vac Low Risk Adult  Completed      Plan:     I have personally reviewed and addressed the Medicare Annual Wellness questionnaire and have noted the following in the patient's chart:  A. Medical and social history B. Use of alcohol, tobacco or illicit drugs  C. Current medications and supplements D. Functional ability and  status E.  Nutritional status F.  Physical activity G. Advance directives H. List of other physicians I.  Hospitalizations, surgeries, and ER visits in previous 12 months J.  [place] to include hearing, vision, cognitive, depression L. Referrals and appointments - none  In addition, I have reviewed and discussed with patient certain preventive protocols, quality metrics, and best practice recommendations. A written personalized care plan for preventive services as well as general preventive health recommendations were provided to patient.  See attached scanned questionnaire for additional information.   Signed,   Lindell Noe, MHA, BS, LPN Health Coach

## 2017-01-14 NOTE — Patient Instructions (Signed)
[name] , Thank you for taking time to come for your Medicare Wellness Visit. I appreciate your ongoing commitment to your health goals. Please review the following plan we discussed and let me know if I can assist you in the future.   These are the goals we discussed: Goals    . Increase physical activity          Starting 01/14/17, I will continue to exercise for 60 min 3 days per week.        This is a list of the screening recommended for you and due dates:  Health Maintenance  Topic Date Due  . Flu Shot  03/19/2017  . Colon Cancer Screening  05/06/2018  . Tetanus Vaccine  03/21/2025  . Pneumonia vaccines  Completed   Preventive Care for Adults  A healthy lifestyle and preventive care can promote health and wellness. Preventive health guidelines for adults include the following key practices.  . A routine yearly physical is a good way to check with your health care provider about your health and preventive screening. It is a chance to share any concerns and updates on your health and to receive a thorough exam.  . Visit your dentist for a routine exam and preventive care every 6 months. Brush your teeth twice a day and floss once a day. Good oral hygiene prevents tooth decay and gum disease.  . The frequency of eye exams is based on your age, health, family medical history, use  of contact lenses, and other factors. Follow your health care provider's ecommendations for frequency of eye exams.  . Eat a healthy diet. Foods like vegetables, fruits, whole grains, low-fat dairy products, and lean protein foods contain the nutrients you need without too many calories. Decrease your intake of foods high in solid fats, added sugars, and salt. Eat the right amount of calories for you. Get information about a proper diet from your health care provider, if necessary.  . Regular physical exercise is one of the most important things you can do for your health. Most adults should get at  least 150 minutes of moderate-intensity exercise (any activity that increases your heart rate and causes you to sweat) each week. In addition, most adults need muscle-strengthening exercises on 2 or more days a week.  Silver Sneakers may be a benefit available to you. To determine eligibility, you may visit the website: www.silversneakers.com or contact program at 403-764-4318 Mon-Fri between 8AM-8PM.   . Maintain a healthy weight. The body mass index (BMI) is a screening tool to identify possible weight problems. It provides an estimate of body fat based on height and weight. Your health care provider can find your BMI and can help you achieve or maintain a healthy weight.   For adults 20 years and older: ? A BMI below 18.5 is considered underweight. ? A BMI of 18.5 to 24.9 is normal. ? A BMI of 25 to 29.9 is considered overweight. ? A BMI of 30 and above is considered obese.   . Maintain normal blood lipids and cholesterol levels by exercising and minimizing your intake of saturated fat. Eat a balanced diet with plenty of fruit and vegetables. Blood tests for lipids and cholesterol should begin at age 48 and be repeated every 5 years. If your lipid or cholesterol levels are high, you are over 50, or you are at high risk for heart disease, you may need your cholesterol levels checked more frequently. Ongoing high lipid and cholesterol levels should  be treated with medicines if diet and exercise are not working.  . If you smoke, find out from your health care provider how to quit. If you do not use tobacco, please do not start.  . If you choose to drink alcohol, please do not consume more than 2 drinks per day. One drink is considered to be 12 ounces (355 mL) of beer, 5 ounces (148 mL) of wine, or 1.5 ounces (44 mL) of liquor.  . If you are 28-68 years old, ask your health care provider if you should take aspirin to prevent strokes.  . Use sunscreen. Apply sunscreen liberally and repeatedly  throughout the day. You should seek shade when your shadow is shorter than you. Protect yourself by wearing long sleeves, pants, a wide-brimmed hat, and sunglasses year round, whenever you are outdoors.  . Once a month, do a whole body skin exam, using a mirror to look at the skin on your back. Tell your health care provider of new moles, moles that have irregular borders, moles that are larger than a pencil eraser, or moles that have changed in shape or color.

## 2017-01-14 NOTE — Assessment & Plan Note (Signed)
Continue current rxs.  Check labs today. 

## 2017-01-14 NOTE — Assessment & Plan Note (Signed)
Well controlled on current rx. No changes made today. 

## 2017-01-14 NOTE — Patient Instructions (Signed)
Good to see you.  We will call you with your results.

## 2017-01-14 NOTE — Progress Notes (Signed)
PCP notes:   Health maintenance:  No gaps identified.   Abnormal screenings:   Hearing - failed Mini-Cog score: 19/20  Patient concerns:   None  Nurse concerns:  None  Next PCP appt:   01/14/17 @ 0915

## 2017-01-14 NOTE — Assessment & Plan Note (Signed)
Followed by urology.   

## 2017-01-14 NOTE — Progress Notes (Signed)
I reviewed health advisor's note, was available for consultation, and agree with documentation and plan.  

## 2017-01-14 NOTE — Assessment & Plan Note (Signed)
Well controlled with Xanax. UDS today.

## 2017-01-15 DIAGNOSIS — Z79899 Other long term (current) drug therapy: Secondary | ICD-10-CM | POA: Diagnosis not present

## 2017-01-30 ENCOUNTER — Encounter: Payer: Self-pay | Admitting: Family Medicine

## 2017-01-31 ENCOUNTER — Other Ambulatory Visit: Payer: Self-pay | Admitting: Family Medicine

## 2017-01-31 MED ORDER — ALPRAZOLAM 0.5 MG PO TABS: ORAL_TABLET | ORAL | 0 refills | Status: DC

## 2017-01-31 NOTE — Telephone Encounter (Signed)
Last office visit 01/14/2017.  Last refilled 12/25/2016 for #30 with no refills.  Ok to refill?

## 2017-02-03 NOTE — Telephone Encounter (Signed)
[facility]prazolam called into [id number] - [place], [place] - [address] Phone: [phone] 

## 2017-02-03 NOTE — Telephone Encounter (Signed)
Pt checking on status of alprazolam to [facility] [address]. Advised refill called in today at 12:29. Pt voiced understanding and will ck with pharmacy later today.

## 2017-03-06 ENCOUNTER — Other Ambulatory Visit: Payer: Self-pay | Admitting: Family Medicine

## 2017-03-06 NOTE — Telephone Encounter (Signed)
Last refill 01/31/17 #30  Last OV 01/14/17  Ok to refill?

## 2017-03-06 NOTE — Telephone Encounter (Signed)
Called in.

## 2017-03-07 ENCOUNTER — Other Ambulatory Visit: Payer: Self-pay | Admitting: Family Medicine

## 2017-03-19 DIAGNOSIS — H524 Presbyopia: Secondary | ICD-10-CM | POA: Diagnosis not present

## 2017-03-19 DIAGNOSIS — H43813 Vitreous degeneration, bilateral: Secondary | ICD-10-CM | POA: Diagnosis not present

## 2017-03-19 DIAGNOSIS — H43393 Other vitreous opacities, bilateral: Secondary | ICD-10-CM | POA: Diagnosis not present

## 2017-03-19 DIAGNOSIS — H52203 Unspecified astigmatism, bilateral: Secondary | ICD-10-CM | POA: Diagnosis not present

## 2017-04-17 ENCOUNTER — Other Ambulatory Visit: Payer: Self-pay | Admitting: Family Medicine

## 2017-05-20 DIAGNOSIS — Z85820 Personal history of malignant melanoma of skin: Secondary | ICD-10-CM | POA: Diagnosis not present

## 2017-05-20 DIAGNOSIS — L570 Actinic keratosis: Secondary | ICD-10-CM | POA: Diagnosis not present

## 2017-05-20 DIAGNOSIS — L821 Other seborrheic keratosis: Secondary | ICD-10-CM | POA: Diagnosis not present

## 2017-05-20 DIAGNOSIS — Z85828 Personal history of other malignant neoplasm of skin: Secondary | ICD-10-CM | POA: Diagnosis not present

## 2017-05-20 DIAGNOSIS — L820 Inflamed seborrheic keratosis: Secondary | ICD-10-CM | POA: Diagnosis not present

## 2017-06-05 ENCOUNTER — Other Ambulatory Visit: Payer: Self-pay | Admitting: Internal Medicine

## 2017-06-05 ENCOUNTER — Other Ambulatory Visit: Payer: Self-pay | Admitting: Family Medicine

## 2017-06-05 ENCOUNTER — Ambulatory Visit: Payer: Medicare Other

## 2017-06-06 ENCOUNTER — Ambulatory Visit (INDEPENDENT_AMBULATORY_CARE_PROVIDER_SITE_OTHER): Payer: Medicare Other

## 2017-06-06 DIAGNOSIS — Z23 Encounter for immunization: Secondary | ICD-10-CM

## 2017-06-10 ENCOUNTER — Other Ambulatory Visit: Payer: Self-pay | Admitting: Family Medicine

## 2017-06-10 ENCOUNTER — Ambulatory Visit (INDEPENDENT_AMBULATORY_CARE_PROVIDER_SITE_OTHER): Payer: Medicare Other | Admitting: Family Medicine

## 2017-06-10 ENCOUNTER — Encounter: Payer: Self-pay | Admitting: Family Medicine

## 2017-06-10 VITALS — BP 142/82 | HR 55 | Temp 97.7°F | Ht 71.5 in | Wt 216.0 lb

## 2017-06-10 DIAGNOSIS — K469 Unspecified abdominal hernia without obstruction or gangrene: Secondary | ICD-10-CM

## 2017-06-10 DIAGNOSIS — M62830 Muscle spasm of back: Secondary | ICD-10-CM

## 2017-06-10 MED ORDER — CYCLOBENZAPRINE HCL 10 MG PO TABS
10.0000 mg | ORAL_TABLET | Freq: Three times a day (TID) | ORAL | 0 refills | Status: DC | PRN
Start: 2017-06-10 — End: 2017-06-26

## 2017-06-10 NOTE — Progress Notes (Signed)
SUBJECTIVE:  [name] is a 76 y.o. male who complains of low back pain for 3 day(s), positional with bending or lifting, without radiation down the legs. Precipitating factors: stood up quickly after he was trimming a toenail. Prior history of back problems: recurrent self limited episodes of low back pain in the past. There is no numbness in the legs.  Current Outpatient Prescriptions on File Prior to Visit  Medication Sig Dispense Refill  . ALPRAZolam (XANAX) 0.5 MG tablet TAKE 1 TABLET AT BEDTIME AS NEEDED FOR ANXIETY 30 tablet 3  . Ascorbic Acid (VITAMIN C) 500 MG CHEW Pt takes 2 chewables daily.    . ASPIRIN EC PO Take by mouth 2 (two) times daily.    . benazepril (LOTENSIN) 40 MG tablet TAKE 1 TABLET BY MOUTH  DAILY 90 tablet 0  . Cholecalciferol (VITAMIN D3) 2000 UNITS TABS Take by mouth.    . Cyanocobalamin (VITAMIN B-12) 5000 MCG SUBL Place under the tongue.    . fenofibrate 160 MG tablet TAKE 1 TABLET BY MOUTH  DAILY 90 tablet 1  . Multiple Vitamin (MULTIVITAMIN WITH MINERALS) TABS Take 1 tablet by mouth daily.    . naproxen sodium (ALEVE) 220 MG tablet Take 1 tablet by mouth daily.    . Omega-3 Fatty Acids (FISH OIL) 1200 MG CAPS Take 1 capsule by mouth daily.    . Turmeric 500 MG CAPS Take 1 capsule by mouth daily.    . verapamil (CALAN-SR) 240 MG CR tablet TAKE 1 TABLET BY MOUTH AT  BEDTIME 90 tablet 3  . [DISCONTINUED] pravastatin (PRAVACHOL) 20 MG tablet Take 1 tablet (20 mg total) by mouth daily. 30 tablet 6   No current facility-administered medications on file prior to visit.     Allergies  Allergen Reactions  . Statins Other (See Comments)    Body aches, memory loss    Past Medical History:  Diagnosis Date  . Atrial flutter ([place]) 03-2012  . Chronic back pain   . Headache(784.0)    migraines x 3 in life  . Hearing loss   . Hyperlipidemia   . Hypertension   . HYPOGONADISM, MALE 10/12/2007   Followup per urology    . Melanoma ([place])    melanoma left forearm  2005- no chemo  . PROSTATE CANCER, HX OF 10/06/2008   Status post surgery approximately 2010      Past Surgical History:  Procedure Laterality Date  . ABLATION  01-24-2014   repeat CTI ablation by Dr Lovena Le  . ABLATION OF DYSRHYTHMIC FOCUS  10/15/2012   CTI ablation by Dr Lovena Le for atrial flutter  . arhtroscopy  right knee  2010  . ATRIAL FLUTTER ABLATION N/A 10/15/2012   Procedure: ATRIAL FLUTTER ABLATION;  Surgeon: Evans Lance, MD;  Location: [facility] CATH LAB;  Service: Cardiovascular;  Laterality: N/A;  . ATRIAL FLUTTER ABLATION N/A 01/24/2014   Procedure: ATRIAL FLUTTER ABLATION;  Surgeon: Evans Lance, MD;  [facility];  Service: Cardiovascular;  Laterality: N/A;  . BACK SURGERY  07/2006   lumbar discectomy  . CARDIAC CATHETERIZATION     15 years ago  . CARDIOVERSION  05/18/2012   Procedure: CARDIOVERSION;  Surgeon: Peter M Martinique, MD;  Location: [facility] ENDOSCOPY;  Service: Cardiovascular;  Laterality: N/A;  on eloquis on-going  . COLONOSCOPY W/ POLYPECTOMY    . LUMBAR LAMINECTOMY/DECOMPRESSION MICRODISCECTOMY  08/16/2011   Procedure: LUMBAR LAMINECTOMY/DECOMPRESSION MICRODISCECTOMY;  Surgeon: Dahlia Bailiff;  Location: [facility];  Service: Orthopedics;  Laterality: N/A;  Lumbar Decompression and Foraminotomy L4-S1    . MELANOMA EXCISION  12/13/03   [place] [facility]  . PROSTATECTOMY  07/2008  . ROTATOR CUFF REPAIR  2008   right; Dr Justice Britain  . TONSILLECTOMY  1947    Family History  Problem Relation Age of Onset  . Pancreatic cancer Mother   . Colon cancer Brother   . Esophageal cancer Neg Hx   . Stomach cancer Neg Hx   . Rectal cancer Neg Hx     Social History   Social History  . Marital status: Married    Spouse name: N/A  . Number of children: 2  . Years of education: N/A   Occupational History  . estimator for contractor    Social History Main Topics  . Smoking status: Former Smoker    Packs/day: 1.00    Years: 20.00    Types: Cigarettes    Quit date:  08/08/1984  . Smokeless tobacco: Never Used  . Alcohol use 1.2 oz/week    2 Cans of beer per week     Comment: socially   2 beer or mixed drinks week  . Drug use: No  . Sexual activity: No   Other Topics Concern  . Not on file   Social History Narrative   in [place] since '84   Desires CPR   The PMH, [place], Social History, Family History, Medications, and allergies have been reviewed in [facility], and have been updated if relevant.  OBJECTIVE: BP (!) 142/82 (BP Location: Left Arm, Patient Position: Sitting, Cuff Size: Normal)   Pulse (!) 55   Temp 97.7 F (36.5 C) (Oral)   Ht 5' 11.5" (1.816 m)   Wt 216 lb (98 kg)   SpO2 97%   BMI 29.71 kg/m   Patient appears to be in mild to moderate pain, antalgic gait noted. Lumbosacral spine area reveals no local tenderness or mass.  Painful and reduced LS ROM noted. Straight leg raise is negative at 45 degrees on bilateral. DTR's, motor strength and sensation normal, including heel and toe gait.  Peripheral pulses are palpable. X-Ray: not indicated.  ASSESSMENT:  lumbar strain  PLAN: For acute pain, rest, intermittent application of heat (do not sleep on heating pad), analgesics and muscle relaxants are recommended. Discussed longer term treatment plan of prn NSAID's and discussed a home back care exercise program with flexion exercise routine. Proper lifting with avoidance of heavy lifting discussed. Consider Physical Therapy and XRay studies if not improving. Call or return to clinic prn if these symptoms worsen or fail to improve as anticipated.

## 2017-06-10 NOTE — Patient Instructions (Signed)
Great to see you.  This is likely a muscle spasm.  Flexeril as needed for spasms.  Keep moving.

## 2017-06-15 ENCOUNTER — Other Ambulatory Visit: Payer: Self-pay | Admitting: Family Medicine

## 2017-06-16 ENCOUNTER — Telehealth: Payer: Self-pay | Admitting: Family Medicine

## 2017-06-16 ENCOUNTER — Other Ambulatory Visit: Payer: Self-pay

## 2017-06-16 MED ORDER — BENAZEPRIL HCL 40 MG PO TABS: 40.0000 mg | ORAL_TABLET | Freq: Every day | ORAL | 0 refills | Status: DC

## 2017-06-16 NOTE — Telephone Encounter (Signed)
Copied from [place] #[id number]. Topic: Quick Communication - See Telephone Encounter >> Jun 16, 2017  1:17 PM Burnis Medin, NT wrote: CRM for notification. See Telephone encounter for:  06/16/17. Pt called and said [facility] and medication was denied and he wanted to know what could he substitute medication for.Medication is benazepril (LOTENSIN) 40 MG tablets Pt. Michela Pitcher he has been taking medication for years. Pt. Uses [facility]

## 2017-06-16 NOTE — Progress Notes (Signed)
Pharm advised that they never rec Rx that was faxed on 10.18.18/refaxed/thx dmf

## 2017-06-16 NOTE — Telephone Encounter (Signed)
On 10.18.2018 #90 was faxed/thx dmf

## 2017-06-16 NOTE — Telephone Encounter (Signed)
Spoke to pt/see phone note/refaxed Rx/thx dmf

## 2017-06-16 NOTE — Telephone Encounter (Signed)
Please see message regarding ? Substitute for Lotensin

## 2017-06-24 DIAGNOSIS — M6208 Separation of muscle (nontraumatic), other site: Secondary | ICD-10-CM | POA: Diagnosis not present

## 2017-06-26 ENCOUNTER — Other Ambulatory Visit: Payer: Self-pay | Admitting: Family Medicine

## 2017-07-01 ENCOUNTER — Telehealth: Payer: Self-pay | Admitting: Family Medicine

## 2017-07-01 NOTE — Telephone Encounter (Signed)
-----   Message from Vaughan Browner sent at 05/28/2017  4:17 PM EDT ----- Regarding: FW: Transfer Back to Dr. Veronia Beets, patient is going to call back later to schedule appt with Dr. Birdie Riddle.  He is going to see Dr. Deborra Medina one last time in November since he can't get in with [facility] until January and he will run out of meds before then.  Thanks, Derinda Late ----- Message ----- From: Midge Minium, MD Sent: 05/27/2017   1:03 PM To: Vaughan Browner Subject: RE: Transfer Back to Dr. Charlies Silvers to return  Thanks! KT ----- Message ----- From: Vaughan Browner Sent: 05/21/2017   2:57 PM To: Midge Minium, MD Subject: Transfer Back to Dr. Birdie Riddle                    Patient and his wife [name] 09[birth year]23[birth year]1943) were previous patients of Dr. Birdie Riddle at [facility], they transferred to Dr. Deborra Medina at [facility] who is now leaving the office going to [facility].  They are requesting to transfer back to Dr. Birdie Riddle.  Patient was advised Dr. Birdie Riddle is out of the office.  Will will advise once she returns to the office next week.  Please advise if ok to schedule.  Thanks, Derinda Late

## 2017-07-14 ENCOUNTER — Ambulatory Visit: Payer: Medicare Other | Admitting: Family Medicine

## 2017-07-14 ENCOUNTER — Encounter: Payer: Self-pay | Admitting: Family Medicine

## 2017-07-14 VITALS — BP 126/88 | HR 52 | Temp 97.6°F | Ht 71.5 in | Wt 211.4 lb

## 2017-07-14 DIAGNOSIS — I4892 Unspecified atrial flutter: Secondary | ICD-10-CM

## 2017-07-14 DIAGNOSIS — E785 Hyperlipidemia, unspecified: Secondary | ICD-10-CM

## 2017-07-14 DIAGNOSIS — G4700 Insomnia, unspecified: Secondary | ICD-10-CM

## 2017-07-14 DIAGNOSIS — I10 Essential (primary) hypertension: Secondary | ICD-10-CM | POA: Diagnosis not present

## 2017-07-14 LAB — LIPID PANEL
CHOL/HDL RATIO: 5
CHOLESTEROL: 211 mg/dL — AB (ref 0–200)
HDL: 45.3 mg/dL (ref >39.00)
LDL CALC: 141 mg/dL — AB (ref 0–99)
NonHDL: 166.19
TRIGLYCERIDES: 126 mg/dL (ref 0.0–149.0)
VLDL: 25.2 mg/dL (ref 0.0–40.0)

## 2017-07-14 LAB — COMPREHENSIVE METABOLIC PANEL
ALBUMIN: 4 g/dL (ref 3.5–5.2)
ALT: 15 U/L (ref 0–53)
AST: 20 U/L (ref 0–37)
Alkaline Phosphatase: 42 U/L (ref 39–117)
BUN: 24 mg/dL — ABNORMAL HIGH (ref 6–23)
CALCIUM: 9.1 mg/dL (ref 8.4–10.5)
CHLORIDE: 105 meq/L (ref 96–112)
CO2: 26 meq/L (ref 19–32)
CREATININE: 1.31 mg/dL (ref 0.40–1.50)
GFR: 56.48 mL/min — AB (ref >60.00)
Glucose, Bld: 118 mg/dL — ABNORMAL HIGH (ref 70–99)
POTASSIUM: 4.1 meq/L (ref 3.5–5.1)
Sodium: 139 mEq/L (ref 135–145)
Total Bilirubin: 0.6 mg/dL (ref 0.2–1.2)
Total Protein: 6.6 g/dL (ref 6.0–8.3)

## 2017-07-14 MED ORDER — ALPRAZOLAM 0.5 MG PO TABS: ORAL_TABLET | ORAL | 3 refills | Status: DC

## 2017-07-14 NOTE — Progress Notes (Signed)
Subjective:   Patient ID: [name], male    DOB: Oct 01, 1940, 76 y.o.   MRN: [id number]  [name] is a pleasant 76 y.o. year old male who presents to clinic today with Follow-up (Patient is here today for a 93-month-F/U.)  on 07/14/2017  HPI:      Insomnia- has been taking xanax at bedtime for years.  HLD- taking pravachol 20 mg daily and fenofibrate 160 mg daily.  Lab Results  Component Value Date   CHOL 186 01/14/2017   HDL 40.70 01/14/2017   LDLCALC 119 (H) 01/14/2017   LDLDIRECT 137.9 12/30/2012   TRIG 131.0 01/14/2017   CHOLHDL 5 01/14/2017   Lab Results  Component Value Date   ALT 17 01/14/2017   AST 24 01/14/2017   ALKPHOS 37 (L) 01/14/2017   BILITOT 0.5 01/14/2017     HTN- has been well controlled on Benazapril 40 mg daily and Verapamil daily. Denies HA, blurred vision, CP or SOB. No LE edema.   Lab Results  Component Value Date   CREATININE 1.45 01/14/2017    A flutter- s/ p ablation. Taking ASA- two 81 mg tablets daily.  H/o prostate CA- sees urology. Recent PSA was zero per pt.  No increased frequency of nocturia.  Lab Results  Component Value Date   PSA 0.01 (L) 01/09/2016   PSA 8.82 (H) 06/06/2008      Current Outpatient Medications on File Prior to Visit  Medication Sig Dispense Refill  . Ascorbic Acid (VITAMIN C) 500 MG CHEW Pt takes 2 chewables daily.    . ASPIRIN EC PO Take by mouth 2 (two) times daily.    . benazepril (LOTENSIN) 40 MG tablet Take 1 tablet (40 mg total) by mouth daily. 90 tablet 0  . Cholecalciferol (VITAMIN D3) 2000 UNITS TABS Take by mouth.    . Cyanocobalamin (VITAMIN B-12) 5000 MCG SUBL Place under the tongue.    . fenofibrate 160 MG tablet TAKE 1 TABLET BY MOUTH  DAILY 90 tablet 1  . Multiple Vitamin (MULTIVITAMIN WITH MINERALS) TABS Take 1 tablet by mouth daily.    . naproxen sodium (ALEVE) 220 MG tablet Take 1 tablet by mouth daily.    . Turmeric 500 MG CAPS Take 1 capsule by mouth daily.    .  verapamil (CALAN-SR) 240 MG CR tablet TAKE 1 TABLET BY MOUTH AT  BEDTIME 90 tablet 3  . [DISCONTINUED] pravastatin (PRAVACHOL) 20 MG tablet Take 1 tablet (20 mg total) by mouth daily. 30 tablet 6   No current facility-administered medications on file prior to visit.     Allergies  Allergen Reactions  . Eucalyptol     Head stops up  . Statins Other (See Comments)    Body aches, memory loss    Past Medical History:  Diagnosis Date  . Atrial flutter ([place]) 03-2012  . Chronic back pain   . Headache(784.0)    migraines x 3 in life  . Hearing loss   . Hyperlipidemia   . Hypertension   . HYPOGONADISM, MALE 10/12/2007   Followup per urology    . Melanoma ([place])    melanoma left forearm 2005- no chemo  . PROSTATE CANCER, HX OF 10/06/2008   Status post surgery approximately 2010      Past Surgical History:  Procedure Laterality Date  . ABLATION  01-24-2014   repeat CTI ablation by Dr Lovena Le  . ABLATION OF DYSRHYTHMIC FOCUS  10/15/2012   CTI ablation by Dr Lovena Le for  atrial flutter  . arhtroscopy  right knee  2010  . ATRIAL FLUTTER ABLATION N/A 10/15/2012   Procedure: ATRIAL FLUTTER ABLATION;  Surgeon: Evans Lance, MD;  Location: [facility] CATH LAB;  Service: Cardiovascular;  Laterality: N/A;  . ATRIAL FLUTTER ABLATION N/A 01/24/2014   Procedure: ATRIAL FLUTTER ABLATION;  Surgeon: Evans Lance, MD;  Location: [facility] CATH LAB;  Service: Cardiovascular;  Laterality: N/A;  . BACK SURGERY  07/2006   lumbar discectomy  . CARDIAC CATHETERIZATION     15 years ago  . CARDIOVERSION  05/18/2012   Procedure: CARDIOVERSION;  Surgeon: Peter M Martinique, MD;  Location: [facility] ENDOSCOPY;  Service: Cardiovascular;  Laterality: N/A;  on eloquis on-going  . COLONOSCOPY W/ POLYPECTOMY    . LUMBAR LAMINECTOMY/DECOMPRESSION MICRODISCECTOMY  08/16/2011   Procedure: LUMBAR LAMINECTOMY/DECOMPRESSION MICRODISCECTOMY;  Surgeon: Dahlia Bailiff;  Location: [facility];  Service: Orthopedics;  Laterality: N/A;  Lumbar Decompression  and Foraminotomy L4-S1    . MELANOMA EXCISION  12/13/03   [facility] [facility]  . PROSTATECTOMY  07/2008  . ROTATOR CUFF REPAIR  2008   right; Dr Justice Britain  . TONSILLECTOMY  1947    Family History  Problem Relation Age of Onset  . Pancreatic cancer Mother   . Colon cancer Brother   . Esophageal cancer Neg Hx   . Stomach cancer Neg Hx   . Rectal cancer Neg Hx     Social History   Socioeconomic History  . Marital status: Married    Spouse name: Not on file  . Number of children: 2  . Years of education: Not on file  . Highest education level: Not on file  Social Needs  . Financial resource strain: Not on file  . Food insecurity - worry: Not on file  . Food insecurity - inability: Not on file  . Transportation needs - medical: Not on file  . Transportation needs - non-medical: Not on file  Occupational History  . Occupation: Film/video editor  Tobacco Use  . Smoking status: Former Smoker    Packs/day: 1.00    Years: 20.00    Pack years: 20.00    Types: Cigarettes    Last attempt to quit: 08/08/1984    Years since quitting: 32.9  . Smokeless tobacco: Never Used  Substance and Sexual Activity  . Alcohol use: Yes    Alcohol/week: 1.2 oz    Types: 2 Cans of beer per week    Comment: socially   2 beer or mixed drinks week  . Drug use: No  . Sexual activity: No  Other Topics Concern  . Not on file  Social History Narrative   in [place] since '84   Desires CPR   The PMH, [place], Social History, Family History, Medications, and allergies have been reviewed in [facility], and have been updated if relevant.   Review of Systems  Constitutional: Negative.   HENT: Negative.   Eyes: Negative.   Respiratory: Negative.   Cardiovascular: Negative.   Gastrointestinal: Negative.   Endocrine: Negative.   Genitourinary: Negative.   Musculoskeletal: Negative.   Allergic/Immunologic: Negative.   Neurological: Negative.   Hematological: Negative.   Psychiatric/Behavioral: Negative.    All other systems reviewed and are negative.      Objective:    BP 126/88 (BP Location: Left Arm, Patient Position: Sitting, Cuff Size: Normal)   Pulse (!) 52   Temp 97.6 F (36.4 C) (Oral)   Ht 5' 11.5" (1.816 m)   Wt 211 lb 6.4  oz (95.9 kg)   SpO2 96%   BMI 29.07 kg/m    Physical Exam  Constitutional: He is oriented to person, place, and time. He appears well-developed and well-nourished. No distress.  HENT:  Head: Normocephalic and atraumatic.  Eyes: Conjunctivae are normal.  Cardiovascular: Normal rate and regular rhythm.  Pulmonary/Chest: Effort normal and breath sounds normal.  Musculoskeletal: Normal range of motion. He exhibits no edema.  Neurological: He is alert and oriented to person, place, and time. No cranial nerve deficit.  Skin: Skin is warm and dry. He is not diaphoretic.  Psychiatric: He has a normal mood and affect. His behavior is normal. Judgment and thought content normal.  Nursing note and vitals reviewed.         Assessment & Plan:   Essential hypertension  Hyperlipidemia, unspecified hyperlipidemia type - Plan: Comprehensive metabolic panel, Lipid panel  Insomnia, unspecified type No Follow-up on file.

## 2017-07-14 NOTE — Assessment & Plan Note (Signed)
Continue current dose of pravachol. Labs today.

## 2017-07-14 NOTE — Patient Instructions (Addendum)
Great to see you. Happy Holidays!  Let's try Melatonin 5 or 10 mg at bedtime.  Keep me updated.

## 2017-07-14 NOTE — Assessment & Plan Note (Signed)
Well controlled.  No changes made. 

## 2017-07-14 NOTE — Assessment & Plan Note (Addendum)
Well controlled with xanax. Willing to try to wean off and try melatonin again- see AVS for details. Rx refilled today.

## 2017-07-14 NOTE — Assessment & Plan Note (Signed)
Asymptomatic s/p ablation. Followed by cardiology.

## 2017-07-17 ENCOUNTER — Ambulatory Visit: Payer: Medicare Other | Admitting: Family Medicine

## 2017-07-24 ENCOUNTER — Other Ambulatory Visit: Payer: Self-pay | Admitting: Family Medicine

## 2017-07-25 NOTE — Telephone Encounter (Signed)
On 11.26.18 #30+3 additional fills were faxed to pharmacy/thx dmf

## 2017-08-05 ENCOUNTER — Telehealth: Payer: Self-pay | Admitting: Family Medicine

## 2017-08-05 NOTE — Telephone Encounter (Signed)
Copied from [place]. Topic: Quick Communication - See Telephone Encounter >> Aug 05, 2017  9:45 AM Conception Chancy, NT wrote: CRM for notification. See Telephone encounter for:  08/05/17.  Patient is calling stating he tried getting his alprazolam refilled and the pharmacy would not let him. He states he does not take this all the time but likes to have it when it is needed. [facility]. If you can not send this into the pharmacy please contact the patient. [phone]

## 2017-08-05 NOTE — Telephone Encounter (Signed)
Called pharm/they did not rec the Rx faxed on in Nov for #30+3/I gave them a verbal/Called pt to inform that they are getting this ready/thx dmf

## 2017-08-07 ENCOUNTER — Other Ambulatory Visit: Payer: Self-pay | Admitting: Family Medicine

## 2017-10-27 DIAGNOSIS — H43813 Vitreous degeneration, bilateral: Secondary | ICD-10-CM | POA: Diagnosis not present

## 2017-10-27 DIAGNOSIS — H35371 Puckering of macula, right eye: Secondary | ICD-10-CM | POA: Diagnosis not present

## 2017-10-27 DIAGNOSIS — H43393 Other vitreous opacities, bilateral: Secondary | ICD-10-CM | POA: Diagnosis not present

## 2017-10-27 DIAGNOSIS — H524 Presbyopia: Secondary | ICD-10-CM | POA: Diagnosis not present

## 2017-10-27 DIAGNOSIS — H5203 Hypermetropia, bilateral: Secondary | ICD-10-CM | POA: Diagnosis not present

## 2017-11-07 DIAGNOSIS — C61 Malignant neoplasm of prostate: Secondary | ICD-10-CM | POA: Diagnosis not present

## 2017-11-17 ENCOUNTER — Other Ambulatory Visit: Payer: Self-pay | Admitting: Family Medicine

## 2017-11-18 DIAGNOSIS — L812 Freckles: Secondary | ICD-10-CM | POA: Diagnosis not present

## 2017-11-18 DIAGNOSIS — C44629 Squamous cell carcinoma of skin of left upper limb, including shoulder: Secondary | ICD-10-CM | POA: Diagnosis not present

## 2017-11-18 DIAGNOSIS — Z85828 Personal history of other malignant neoplasm of skin: Secondary | ICD-10-CM | POA: Diagnosis not present

## 2017-11-18 DIAGNOSIS — L570 Actinic keratosis: Secondary | ICD-10-CM | POA: Diagnosis not present

## 2017-11-18 DIAGNOSIS — L821 Other seborrheic keratosis: Secondary | ICD-10-CM | POA: Diagnosis not present

## 2017-11-18 DIAGNOSIS — D0471 Carcinoma in situ of skin of right lower limb, including hip: Secondary | ICD-10-CM | POA: Diagnosis not present

## 2017-11-18 DIAGNOSIS — Z85820 Personal history of malignant melanoma of skin: Secondary | ICD-10-CM | POA: Diagnosis not present

## 2017-11-20 ENCOUNTER — Other Ambulatory Visit: Payer: Self-pay

## 2017-11-20 ENCOUNTER — Observation Stay (HOSPITAL_BASED_OUTPATIENT_CLINIC_OR_DEPARTMENT_OTHER): Payer: Medicare Other

## 2017-11-20 ENCOUNTER — Encounter (HOSPITAL_COMMUNITY): Payer: Self-pay

## 2017-11-20 ENCOUNTER — Emergency Department (HOSPITAL_COMMUNITY): Payer: Medicare Other

## 2017-11-20 ENCOUNTER — Observation Stay (HOSPITAL_COMMUNITY)
Admission: EM | Admit: 2017-11-20 | Discharge: 2017-11-24 | Disposition: A | Discharge status: Home / Self Care | Payer: Medicare Other | Attending: Internal Medicine | Admitting: Internal Medicine

## 2017-11-20 ENCOUNTER — Observation Stay (HOSPITAL_COMMUNITY): Payer: Medicare Other

## 2017-11-20 ENCOUNTER — Other Ambulatory Visit (HOSPITAL_COMMUNITY): Payer: Medicare Other

## 2017-11-20 DIAGNOSIS — R0602 Shortness of breath: Secondary | ICD-10-CM

## 2017-11-20 DIAGNOSIS — M549 Dorsalgia, unspecified: Secondary | ICD-10-CM | POA: Insufficient documentation

## 2017-11-20 DIAGNOSIS — N183 Chronic kidney disease, stage 3 unspecified: Secondary | ICD-10-CM | POA: Diagnosis present

## 2017-11-20 DIAGNOSIS — J90 Pleural effusion, not elsewhere classified: Secondary | ICD-10-CM | POA: Diagnosis not present

## 2017-11-20 DIAGNOSIS — Z87891 Personal history of nicotine dependence: Secondary | ICD-10-CM | POA: Insufficient documentation

## 2017-11-20 DIAGNOSIS — R0781 Pleurodynia: Secondary | ICD-10-CM | POA: Insufficient documentation

## 2017-11-20 DIAGNOSIS — E785 Hyperlipidemia, unspecified: Secondary | ICD-10-CM | POA: Insufficient documentation

## 2017-11-20 DIAGNOSIS — Z888 Allergy status to other drugs, medicaments and biological substances status: Secondary | ICD-10-CM | POA: Insufficient documentation

## 2017-11-20 DIAGNOSIS — I4891 Unspecified atrial fibrillation: Secondary | ICD-10-CM | POA: Diagnosis present

## 2017-11-20 DIAGNOSIS — Z8546 Personal history of malignant neoplasm of prostate: Secondary | ICD-10-CM | POA: Insufficient documentation

## 2017-11-20 DIAGNOSIS — J9811 Atelectasis: Secondary | ICD-10-CM | POA: Diagnosis not present

## 2017-11-20 DIAGNOSIS — Z85820 Personal history of malignant melanoma of skin: Secondary | ICD-10-CM | POA: Diagnosis not present

## 2017-11-20 DIAGNOSIS — G8929 Other chronic pain: Secondary | ICD-10-CM | POA: Diagnosis not present

## 2017-11-20 DIAGNOSIS — Z7901 Long term (current) use of anticoagulants: Secondary | ICD-10-CM | POA: Diagnosis not present

## 2017-11-20 DIAGNOSIS — I480 Paroxysmal atrial fibrillation: Secondary | ICD-10-CM | POA: Diagnosis not present

## 2017-11-20 DIAGNOSIS — R079 Chest pain, unspecified: Secondary | ICD-10-CM | POA: Diagnosis present

## 2017-11-20 DIAGNOSIS — H9190 Unspecified hearing loss, unspecified ear: Secondary | ICD-10-CM | POA: Insufficient documentation

## 2017-11-20 DIAGNOSIS — I4510 Unspecified right bundle-branch block: Secondary | ICD-10-CM | POA: Diagnosis not present

## 2017-11-20 DIAGNOSIS — R509 Fever, unspecified: Secondary | ICD-10-CM | POA: Diagnosis not present

## 2017-11-20 DIAGNOSIS — R072 Precordial pain: Secondary | ICD-10-CM | POA: Diagnosis not present

## 2017-11-20 DIAGNOSIS — I10 Essential (primary) hypertension: Secondary | ICD-10-CM | POA: Diagnosis not present

## 2017-11-20 DIAGNOSIS — I129 Hypertensive chronic kidney disease with stage 1 through stage 4 chronic kidney disease, or unspecified chronic kidney disease: Secondary | ICD-10-CM | POA: Insufficient documentation

## 2017-11-20 DIAGNOSIS — I350 Nonrheumatic aortic (valve) stenosis: Secondary | ICD-10-CM | POA: Diagnosis not present

## 2017-11-20 DIAGNOSIS — E291 Testicular hypofunction: Secondary | ICD-10-CM | POA: Diagnosis not present

## 2017-11-20 DIAGNOSIS — Z9889 Other specified postprocedural states: Secondary | ICD-10-CM | POA: Diagnosis not present

## 2017-11-20 DIAGNOSIS — Z7982 Long term (current) use of aspirin: Secondary | ICD-10-CM | POA: Diagnosis not present

## 2017-11-20 DIAGNOSIS — Z79899 Other long term (current) drug therapy: Secondary | ICD-10-CM | POA: Insufficient documentation

## 2017-11-20 DIAGNOSIS — R071 Chest pain on breathing: Secondary | ICD-10-CM | POA: Diagnosis not present

## 2017-11-20 LAB — RAPID URINE DRUG SCREEN, HOSP PERFORMED
AMPHETAMINES: NOT DETECTED
BENZODIAZEPINES: POSITIVE — AB
Barbiturates: NOT DETECTED
COCAINE: NOT DETECTED
Opiates: NOT DETECTED
Tetrahydrocannabinol: NOT DETECTED

## 2017-11-20 LAB — HEMOGLOBIN A1C
Hgb A1c MFr Bld: 5.5 % (ref 4.8–5.6)
Mean Plasma Glucose: 111.15 mg/dL

## 2017-11-20 LAB — URINALYSIS, ROUTINE W REFLEX MICROSCOPIC
Bilirubin Urine: NEGATIVE
GLUCOSE, UA: NEGATIVE mg/dL
HGB URINE DIPSTICK: NEGATIVE
KETONES UR: NEGATIVE mg/dL
LEUKOCYTES UA: NEGATIVE
Nitrite: NEGATIVE
PH: 6 (ref 5.0–8.0)
PROTEIN: NEGATIVE mg/dL
Specific Gravity, Urine: 1.04 — ABNORMAL HIGH (ref 1.005–1.030)

## 2017-11-20 LAB — ECHOCARDIOGRAM COMPLETE
Height: 71 in
Weight: 3289.6 oz

## 2017-11-20 LAB — BASIC METABOLIC PANEL
Anion gap: 9 (ref 5–15)
BUN: 17 mg/dL (ref 6–20)
CHLORIDE: 106 mmol/L (ref 101–111)
CO2: 22 mmol/L (ref 22–32)
CREATININE: 1.29 mg/dL — AB (ref 0.61–1.24)
Calcium: 9.4 mg/dL (ref 8.9–10.3)
GFR calc Af Amer: >60 mL/min (ref >60)
GFR calc non Af Amer: 52 mL/min — ABNORMAL LOW (ref >60)
Glucose, Bld: 165 mg/dL — ABNORMAL HIGH (ref 65–99)
POTASSIUM: 3.7 mmol/L (ref 3.5–5.1)
Sodium: 137 mmol/L (ref 135–145)

## 2017-11-20 LAB — STREP PNEUMONIAE URINARY ANTIGEN: STREP PNEUMO URINARY ANTIGEN: NEGATIVE

## 2017-11-20 LAB — CBC
HEMATOCRIT: 46.2 % (ref 39.0–52.0)
Hemoglobin: 15.8 g/dL (ref 13.0–17.0)
MCH: 31.3 pg (ref 26.0–34.0)
MCHC: 34.2 g/dL (ref 30.0–36.0)
MCV: 91.5 fL (ref 78.0–100.0)
Platelets: 233 10*3/uL (ref 150–400)
RBC: 5.05 MIL/uL (ref 4.22–5.81)
RDW: 13.2 % (ref 11.5–15.5)
WBC: 8.2 10*3/uL (ref 4.0–10.5)

## 2017-11-20 LAB — TROPONIN I

## 2017-11-20 LAB — LIPID PANEL
Cholesterol: 134 mg/dL (ref 0–200)
HDL: 36 mg/dL — AB (ref >40)
LDL CALC: 80 mg/dL (ref 0–99)
TRIGLYCERIDES: 90 mg/dL (ref <150)
Total CHOL/HDL Ratio: 3.7 RATIO
VLDL: 18 mg/dL (ref 0–40)

## 2017-11-20 LAB — BRAIN NATRIURETIC PEPTIDE: B NATRIURETIC PEPTIDE 5: 91.3 pg/mL (ref 0.0–100.0)

## 2017-11-20 LAB — I-STAT TROPONIN, ED: Troponin i, poc: 0.02 ng/mL (ref 0.00–0.08)

## 2017-11-20 LAB — MAGNESIUM: Magnesium: 2 mg/dL (ref 1.7–2.4)

## 2017-11-20 LAB — TSH: TSH: 1.904 u[IU]/mL (ref 0.350–4.500)

## 2017-11-20 LAB — D-DIMER, QUANTITATIVE (NOT AT ARMC): D DIMER QUANT: 0.55 ug{FEU}/mL — AB (ref 0.00–0.50)

## 2017-11-20 MED ORDER — ENOXAPARIN SODIUM 40 MG/0.4ML ~~LOC~~ SOLN: 40.0000 mg | Freq: Every day | SUBCUTANEOUS | Status: DC

## 2017-11-20 MED ORDER — IOPAMIDOL (ISOVUE-370) INJECTION 76%
INTRAVENOUS | Status: AC
  Administered 2017-11-20: 100 mL
  Filled 2017-11-20: qty 100

## 2017-11-20 MED ORDER — APIXABAN 5 MG PO TABS
5.0000 mg | ORAL_TABLET | Freq: Two times a day (BID) | ORAL | Status: DC
Start: 2017-11-20 — End: 2017-11-21
  Administered 2017-11-20 – 2017-11-21 (×3): 5 mg via ORAL
  Filled 2017-11-20 (×3): qty 1

## 2017-11-20 MED ORDER — ASPIRIN 325 MG PO TABS
325.0000 mg | ORAL_TABLET | Freq: Every day | ORAL | Status: DC
  Administered 2017-11-20: 325 mg via ORAL
  Filled 2017-11-20 (×2): qty 1

## 2017-11-20 MED ORDER — SODIUM CHLORIDE 0.9 % IV SOLN
INTRAVENOUS | Status: DC
  Administered 2017-11-20: 05:00:00 via INTRAVENOUS

## 2017-11-20 MED ORDER — VITAMIN C 500 MG PO TABS
1000.0000 mg | ORAL_TABLET | Freq: Every day | ORAL | Status: DC
  Administered 2017-11-20 – 2017-11-24 (×5): 1000 mg via ORAL
  Filled 2017-11-20 (×5): qty 2

## 2017-11-20 MED ORDER — VITAMIN D 1000 UNITS PO TABS
2000.0000 [IU] | ORAL_TABLET | Freq: Every day | ORAL | Status: DC
  Administered 2017-11-20 – 2017-11-24 (×5): 2000 [IU] via ORAL
  Filled 2017-11-20 (×5): qty 2

## 2017-11-20 MED ORDER — BENAZEPRIL HCL 40 MG PO TABS
40.0000 mg | ORAL_TABLET | Freq: Every day | ORAL | Status: DC
  Administered 2017-11-20 – 2017-11-21 (×2): 40 mg via ORAL
  Filled 2017-11-20 (×2): qty 1

## 2017-11-20 MED ORDER — DOXYCYCLINE HYCLATE 100 MG PO TABS: 100.0000 mg | ORAL_TABLET | Freq: Two times a day (BID) | ORAL | Status: DC

## 2017-11-20 MED ORDER — OXYBUTYNIN CHLORIDE ER 10 MG PO TB24
10.0000 mg | ORAL_TABLET | Freq: Every day | ORAL | Status: DC
  Administered 2017-11-20 – 2017-11-24 (×5): 10 mg via ORAL
  Filled 2017-11-20 (×5): qty 1

## 2017-11-20 MED ORDER — ONDANSETRON HCL 4 MG/2ML IJ SOLN: 4.0000 mg | Freq: Four times a day (QID) | INTRAMUSCULAR | Status: DC | PRN

## 2017-11-20 MED ORDER — TURMERIC 500 MG PO CAPS: 1.0000 | ORAL_CAPSULE | Freq: Every day | ORAL | Status: DC

## 2017-11-20 MED ORDER — ACETAMINOPHEN 325 MG PO TABS
650.0000 mg | ORAL_TABLET | ORAL | Status: DC | PRN
  Administered 2017-11-21 – 2017-11-23 (×4): 650 mg via ORAL
  Filled 2017-11-20 (×4): qty 2

## 2017-11-20 MED ORDER — FENOFIBRATE 160 MG PO TABS
160.0000 mg | ORAL_TABLET | Freq: Every day | ORAL | Status: DC
  Administered 2017-11-20 – 2017-11-24 (×5): 160 mg via ORAL
  Filled 2017-11-20 (×5): qty 1

## 2017-11-20 MED ORDER — HYDRALAZINE HCL 20 MG/ML IJ SOLN: 5.0000 mg | INTRAMUSCULAR | Status: DC | PRN

## 2017-11-20 MED ORDER — ZOLPIDEM TARTRATE 5 MG PO TABS: 5.0000 mg | ORAL_TABLET | Freq: Every evening | ORAL | Status: DC | PRN

## 2017-11-20 MED ORDER — VITAMIN B-12 1000 MCG PO TABS
5000.0000 ug | ORAL_TABLET | Freq: Every day | ORAL | Status: DC
  Administered 2017-11-20 – 2017-11-24 (×5): 5000 ug via ORAL
  Filled 2017-11-20 (×5): qty 5

## 2017-11-20 MED ORDER — ALPRAZOLAM 0.25 MG PO TABS: 0.5000 mg | ORAL_TABLET | Freq: Every evening | ORAL | Status: DC | PRN

## 2017-11-20 MED ORDER — NITROGLYCERIN 0.4 MG SL SUBL
0.4000 mg | SUBLINGUAL_TABLET | SUBLINGUAL | Status: DC | PRN
  Filled 2017-11-20: qty 1

## 2017-11-20 MED ORDER — MORPHINE SULFATE (PF) 4 MG/ML IV SOLN: 2.0000 mg | INTRAVENOUS | Status: DC | PRN

## 2017-11-20 MED ORDER — ADULT MULTIVITAMIN W/MINERALS CH
1.0000 | ORAL_TABLET | Freq: Every day | ORAL | Status: DC
  Administered 2017-11-20 – 2017-11-24 (×5): 1 via ORAL
  Filled 2017-11-20 (×5): qty 1

## 2017-11-20 MED ORDER — VERAPAMIL HCL ER 240 MG PO TBCR
240.0000 mg | EXTENDED_RELEASE_TABLET | Freq: Every day | ORAL | Status: DC
  Administered 2017-11-20 – 2017-11-23 (×4): 240 mg via ORAL
  Filled 2017-11-20 (×4): qty 1

## 2017-11-20 NOTE — ED Provider Notes (Signed)
[facility] 4E CV SURGICAL PROGRESSIVE CARE Provider Note   CSN: [id number] Arrival date & time: 11/20/17  0048     History   Chief Complaint Chief Complaint  Patient presents with  . Chest Pain    HPI [name] is a 77 y.o. male.  HPI   Woke up 12AM with chest pains, didn't go away so woke up wife. Had them before and took tums and went away, but this time it didn't go away. Tried baby aspirin and didn't improve. Feels like poking in the heart. Feels ok if breathing shallow but worse with deep breath.  Reports not feeling self for last 4-5 days, feeling fatigued, lethargic, not self.  Started shortness of breath with exertion/mowing/working in yard about one year ago.  Reports hx of afib, was in it, then cardioverted, then had ablation and it worked for 6 mos then went out of rhythm again.  Began to take xarelto.   Pain now 6/10 only with deep breaths.  No cough. Is having shortness of breath  3 years since ablation, not taking blood thinners anymore, has not had any atrial fibrillation since then. Saw Dr. Lovena Castro  No recent surgeries (Saw dermatologsit yesterday for removal of skin areas). Sat and Sunday drove 150 miles, but was getting out, stretching legs. No other recent travel.    Past Medical History:  Diagnosis Date  . Atrial flutter (HCC) 03-2012  . Chronic back pain   . Headache(784.0)    migraines x 3 in life  . Hearing loss   . Hyperlipidemia   . Hypertension   . HYPOGONADISM, MALE 10/12/2007   Followup per urology    . Melanoma (HCC)    melanoma left forearm 2005- no chemo  . PROSTATE CANCER, HX OF 10/06/2008   Status post surgery approximately 2010      Patient Active Problem List   Diagnosis Date Noted  . Chest pain 11/20/2017  . CKD (chronic kidney disease), stage III (HCC) 11/20/2017  . Atrial fibrillation with RVR (HCC) 04/09/2012  . Insomnia 08/14/2011  . PROSTATE CANCER, HX OF 10/06/2008  . ERECTILE DYSFUNCTION 10/12/2007  . Hyperlipidemia  10/09/2007  . Essential hypertension 10/09/2007  . DIVERTICULITIS, HX OF 08/19/2000    Past Surgical History:  Procedure Laterality Date  . ABLATION  01-24-2014   repeat CTI ablation by Dr Castro  . ABLATION OF DYSRHYTHMIC FOCUS  10/15/2012   CTI ablation by Dr Castro for atrial flutter  . arhtroscopy  right knee  2010  . ATRIAL FLUTTER ABLATION N/A 10/15/2012   Procedure: ATRIAL FLUTTER ABLATION;  Surgeon: Gregg W Taylor, MD;  Location: [facility] CATH LAB;  Service: Cardiovascular;  Laterality: N/A;  . ATRIAL FLUTTER ABLATION N/A 01/24/2014   Procedure: ATRIAL FLUTTER ABLATION;  Surgeon: Gregg W Taylor, MD;  Location: [facility] CATH LAB;  Service: Cardiovascular;  Laterality: N/A;  . BACK SURGERY  07/2006   lumbar discectomy  . CARDIAC CATHETERIZATION     15  years ago  . CARDIOVERSION  05/18/2012   Procedure: CARDIOVERSION;  Surgeon: Martin M Martinique, MD;  Location: [facility] ENDOSCOPY;  Service: Cardiovascular;  Laterality: N/A;  on eloquis on-going  . COLONOSCOPY W/ POLYPECTOMY    . LUMBAR LAMINECTOMY/DECOMPRESSION MICRODISCECTOMY  08/16/2011   Procedure: LUMBAR LAMINECTOMY/DECOMPRESSION MICRODISCECTOMY;  Surgeon: [name];  Location: [facility] ORS;  Service: Orthopedics;  Laterality: N/A;  Lumbar Decompression and Foraminotomy L4-S1    . MELANOMA EXCISION  12/13/03   [place] [facility]  . PROSTATECTOMY  07/2008  .  ROTATOR CUFF REPAIR  2008   right; Dr Justice Castro  . TONSILLECTOMY  1947        Home Medications    Prior to Admission medications   Medication Sig Start Date End Date Taking? Authorizing Provider  ALPRAZolam [name]) 0.5 MG tablet TAKE 1 TABLET AT BEDTIME AS NEEDED FOR ANXIETY 07/14/17  Yes Martin Passy, MD  Ascorbic Acid (VITAMIN C) 500 MG CHEW Chew 1,000 mg by mouth daily.    Yes [provider]  aspirin EC 81 MG tablet Take 81 mg by mouth daily.   Yes [provider]  benazepril (LOTENSIN) 40 MG tablet TAKE 1 TABLET BY MOUTH  DAILY 11/17/17  Yes Martin Passy, MD    Cholecalciferol (VITAMIN D3) 2000 UNITS TABS Take 2,000 Units by mouth daily.    Yes [provider]  Cyanocobalamin (VITAMIN B-12) 5000 MCG SUBL Place 5,000 mcg under the tongue daily.    Yes [provider]  fenofibrate 160 MG tablet TAKE 1 TABLET BY MOUTH  DAILY 11/17/17  Yes Martin Passy, MD  Multiple Vitamin (MULTIVITAMIN WITH MINERALS) TABS Take 1 tablet by mouth daily.   Yes [provider]  oxybutynin (DITROPAN-XL) 10 MG 24 hr tablet Take 10 mg by mouth daily. 11/07/17  Yes [provider]  Turmeric 500 MG CAPS Take 1 capsule by mouth daily.   Yes [provider]  verapamil (CALAN-SR) 240 MG CR tablet TAKE 1 TABLET BY MOUTH AT  BEDTIME 04/17/17  Yes Martin Passy, MD  pravastatin (PRAVACHOL) 20 MG tablet Take 1 tablet (20 mg total) by mouth daily. 10/10/11 11/12/11  Colon Branch, MD    Family History Family History  Problem Relation Age of Onset  . Pancreatic cancer Mother   . Colon cancer Brother   . Esophageal cancer Neg Hx   . Stomach cancer Neg Hx   . Rectal cancer Neg Hx     Social History Social History   Tobacco Use  . Smoking status: Former Smoker    Packs/day: 1.00    Years: 20.00    Pack years: 20.00    Types: Cigarettes    Last attempt to quit: 08/08/1984    Years since quitting: 33.3  . Smokeless tobacco: Never Used  Substance Use Topics  . Alcohol use: Yes    Alcohol/week: 1.2 oz    Types: 2 Cans of beer per week    Comment: socially   2 beer or mixed drinks week  . Drug use: No     Allergies   Eucalyptol and Statins   Review of Systems Review of Systems  Constitutional: Positive for fatigue. Negative for fever.  HENT: Negative for sore throat.   Eyes: Negative for visual disturbance.  Respiratory: Positive for shortness of breath. Negative for cough.   Cardiovascular: Positive for chest pain and leg swelling (unchanged). Negative for palpitations.  Gastrointestinal: Negative for abdominal pain, nausea  and vomiting.  Genitourinary: Negative for difficulty urinating.  Musculoskeletal: Negative for back pain and neck stiffness.  Skin: Negative for rash.  Neurological: Negative for syncope and headaches.     Physical Exam Updated Vital Signs BP (!) 153/71 (BP Location: Left Arm)   Pulse 86   Temp 99.9 F (37.7 C) (Oral)   Resp 19   Ht 5\' 11"  (1.803 m)   Wt 93.3 kg (205 lb 9.6 oz)   SpO2 97%   BMI 28.68 kg/m   Physical Exam  Constitutional: He is oriented  to person, place, and time. He appears well-developed and well-nourished. No distress.  HENT:  Head: Normocephalic and atraumatic.  Eyes: Conjunctivae and EOM are normal.  Neck: Normal range of motion.  Cardiovascular: Normal rate, regular rhythm, normal heart sounds and intact distal pulses. Exam reveals no gallop and no friction rub.  No murmur heard. No chest wall tenderness  Pulmonary/Chest: Effort normal and breath sounds normal. No respiratory distress. He has no wheezes. He has no rales.  Abdominal: Soft. He exhibits no distension. There is no tenderness. There is no guarding.  Musculoskeletal: He exhibits no edema.  Neurological: He is alert and oriented to person, place, and time.  Skin: Skin is warm and dry. He is not diaphoretic.  Nursing note and vitals reviewed.    ED Treatments / Results  Labs (all labs ordered are listed, but only abnormal results are displayed) Labs Reviewed  BASIC METABOLIC PANEL - Abnormal; Notable for the following components:      Result Value   Glucose, Bld 165 (*)    Creatinine, Ser 1.29 (*)    GFR calc non Af Amer 52 (*)    All other components within normal limits  D-DIMER, QUANTITATIVE (NOT AT [facility]) - Abnormal; Notable for the following components:   D-Dimer, Quant 0.55 (*)    All other components within normal limits  RAPID URINE DRUG SCREEN, HOSP PERFORMED - Abnormal; Notable for the following components:   Benzodiazepines POSITIVE (*)    All other components within  normal limits  LIPID PANEL - Abnormal; Notable for the following components:   HDL 36 (*)    All other components within normal limits  URINALYSIS, ROUTINE W REFLEX MICROSCOPIC - Abnormal; Notable for the following components:   Specific Gravity, Urine 1.040 (*)    All other components within normal limits  CBC  BRAIN NATRIURETIC PEPTIDE  HEMOGLOBIN A1C  TROPONIN I  TROPONIN I  TROPONIN I  TSH  MAGNESIUM  STREP PNEUMONIAE URINARY ANTIGEN  LEGIONELLA PNEUMOPHILA SEROGP 1 UR AG  CBC WITH DIFFERENTIAL/PLATELET  COMPREHENSIVE METABOLIC PANEL  MAGNESIUM  I-STAT TROPONIN, ED    EKG EKG Interpretation  Date/Time:  Thursday November 20 2017 00:54:55 EDT Ventricular Rate:  133 PR Interval:    QRS Duration: 104 QT Interval:  324 QTC Calculation: 482 R Axis:   -4 Text Interpretation:  Atrial fibrillation with rapid ventricular response Incomplete right bundle branch block Anterior infarct , age undetermined Abnormal ECG Since prior ECG, patient is now in atrial fibrillation with RVR Confirmed by Gareth Morgan [phone]) on 11/20/2017 1:52:59 AM Also confirmed by Gareth Morgan [phone]), editor Philomena Doheny [phone])  on 11/20/2017 8:06:05 AM   Radiology Dg Chest 2 View  Result Date: 11/20/2017 CLINICAL DATA:  Chest pain EXAM: CHEST - 2 VIEW COMPARISON:  08/09/2011 FINDINGS: Small left-sided pleural effusion. Airspace disease at the left lung base. Normal heart size. No pneumothorax. IMPRESSION: Small left-sided pleural effusion with left basilar airspace disease suspicious for pneumonia. Radiographic follow-up to resolution recommended. Electronically Signed   By: Donavan Foil M.D.   On: 11/20/2017 01:25   Ct Angio Chest Pe W And/or Wo Contrast  Result Date: 11/20/2017 CLINICAL DATA:  Left chest pain history of prostate cancer. Shortness of breath. EXAM: CT ANGIOGRAPHY CHEST WITH CONTRAST TECHNIQUE: Multidetector CT imaging of the chest was performed using the standard protocol during bolus  administration of intravenous contrast. Multiplanar CT image reconstructions and MIPs were obtained to evaluate the vascular anatomy. CONTRAST:  168mL ISOVUE-370 IOPAMIDOL (ISOVUE-370)  INJECTION 76% COMPARISON:  Chest radiograph 11/20/2017 FINDINGS: Cardiovascular: Contrast injection is sufficient to demonstrate satisfactory opacification of the pulmonary arteries to the segmental level. There is no pulmonary embolus. The main pulmonary artery is within normal limits for size. There is a normal 3-vessel arch branching pattern without evidence of acute aortic syndrome. There is noaortic atherosclerosis. Heart size is normal, without pericardial effusion. There are coronary artery calcifications. Mediastinum/Nodes: No mediastinal, hilar or axillary lymphadenopathy. The visualized thyroid and thoracic esophageal course are unremarkable. Lungs/Pleura: Medium-sized left pleural effusion. There is associated left basilar atelectasis. Upper Abdomen: Contrast bolus timing is not optimized for evaluation of the abdominal organs. Within this limitation, the visualized organs of the upper abdomen are normal. Musculoskeletal: Flowing midthoracic osteophytosis. Review of the MIP images confirms the above findings. IMPRESSION: 1. No pulmonary embolus. 2. Medium-sized left pleural effusion with associated atelectasis. Electronically Signed   By: Ulyses Jarred M.D.   On: 11/20/2017 05:30    Procedures Procedures (including critical care time)  Medications Ordered in ED Medications  ALPRAZolam (XANAX) tablet 0.5 mg (has no administration in time range)  vitamin C (ASCORBIC ACID) tablet 1,000 mg (1,000 mg Oral Given 11/20/17 0835)  benazepril (LOTENSIN) tablet 40 mg (40 mg Oral Given 11/20/17 0258)  cholecalciferol (VITAMIN D) tablet 2,000 Units (2,000 Units Oral Given 11/20/17 0831)  vitamin B-12 (CYANOCOBALAMIN) tablet 5,000 mcg (5,000 mcg Oral Given 11/20/17 5277)  fenofibrate tablet 160 mg (160 mg Oral Given 11/20/17 0831)    multivitamin with minerals tablet 1 tablet (1 tablet Oral Given 11/20/17 0833)  verapamil (CALAN-SR) CR tablet 240 mg (has no administration in time range)  nitroGLYCERIN (NITROSTAT) SL tablet 0.4 mg (has no administration in time range)  morphine 4 MG/ML injection 2 mg (has no administration in time range)  acetaminophen (TYLENOL) tablet 650 mg (has no administration in time range)  ondansetron (ZOFRAN) injection 4 mg (has no administration in time range)  zolpidem (AMBIEN) tablet 5 mg (has no administration in time range)  hydrALAZINE (APRESOLINE) injection 5 mg (has no administration in time range)  oxybutynin (DITROPAN-XL) 24 hr tablet 10 mg (10 mg Oral Given 11/20/17 0832)  apixaban (ELIQUIS) tablet 5 mg (5 mg Oral Given 11/20/17 1138)  iopamidol (ISOVUE-370) 76 % injection (100 mLs  Contrast Given 11/20/17 0330)     Initial Impression / Assessment and Plan / ED Course  I have reviewed the triage vital signs and the nursing notes.  Pertinent labs & imaging results that were available during my care of the patient were reviewed by me and considered in my medical decision making (see chart for details).     77yo male with history above presents with concern for chest pain.  Patient in afib with RVR on arrival however rate improved spontaneously.  Has had about 5 days of fatigue, possible onset of afib at that time.  DDimer .5, will order CTA. If CTA negative, patient is high risk HEAR score and feel he will require chest pain observation. Has had exertional dyspnea over time and developed CP last night. Pleuritic pain may be from pleural effusion, however will admit for continued work up and evaluation.  Final Clinical Impressions(s) / ED Diagnoses   Final diagnoses:  Chest pain, unspecified type  Paroxysmal atrial fibrillation ([place])  Pleural effusion    ED Discharge Orders    None       Gareth Morgan, MD 11/20/17 [phone]

## 2017-11-20 NOTE — Care Management Note (Signed)
Case Management Note Marvetta Gibbons RN, BSN Unit 4E-Case Manager [phone]  Patient Details  Name: [name] MRN: [id number] Date of Birth: 11/27/1940  Subjective/Objective:   Pt presented with chest pain, new afib                 Action/Plan: PTA pt lived at home with wife, independent- pt has been started on Eliquis- per insurance check copay is $45/mo- spoke with pt and wife at bedside coverage info shared and 30 day free card provided to use on discharge- per pt he uses [facility] on [address].  No further CM needs noted at this time.   Expected Discharge Date:                  Expected Discharge Plan:  Home/Self Care  In-House Referral:     Discharge planning Services  CM Consult, Medication Assistance  Post Acute Care Choice:  NA Choice offered to:     DME Arranged:    DME Agency:     HH Arranged:    Martin Agency:     Status of Service:  Completed, signed off  If discussed at [place] of Stay Meetings, dates discussed:    Discharge Disposition: home/self care   Additional Comments:  Dawayne Patricia, RN 11/20/2017, 4:24 PM

## 2017-11-20 NOTE — Progress Notes (Signed)
  Echocardiogram 2D Echocardiogram has been performed.  [name] F 11/20/2017, 3:03 PM

## 2017-11-20 NOTE — ED Triage Notes (Signed)
Pt states that he began to have L sided Cp around midnight, in afib, hx of ablation. Some SOB

## 2017-11-20 NOTE — Progress Notes (Signed)
Patient off the floor to vascular study at this time.  Emelda Fear, RN

## 2017-11-20 NOTE — Progress Notes (Signed)
Patient ID: [name], male   DOB: 1940-10-06, 77 y.o.   MRN: [id number] Patient was admitted early this morning for chest pain and shortness of breath.  CT angiogram was ordered.  Cardiology was consulted.  Initial troponin was negative.  Patient seen and examined at bedside and plan of care discussed with him.  CT angiogram is negative for PE but there is left-sided pleural effusion.  Patient denies any symptoms of cough, fever or chills.  He has no symptoms suggestive of pneumonia.  He is currently not on any antibiotics.  Prior medical records and this morning's atrophy H&P was reviewed by myself.  Will repeat a.m. labs.  Follow further recommendations from cardiology.  Follow echo.

## 2017-11-20 NOTE — Progress Notes (Signed)
PHARMACIST - PHYSICIAN ORDER COMMUNICATION ° °CONCERNING: P&T Medication Policy on Herbal Medications ° °DESCRIPTION:  This patient’s order for:  Turmeric  has been noted. ° °This product(s) is classified as an “herbal” or natural product. °Due to a lack of definitive safety studies or FDA approval, nonstandard manufacturing practices, plus the potential risk of unknown drug-drug interactions while on inpatient medications, the Pharmacy and Therapeutics Committee does not permit the use of “herbal” or natural products of this type within [place]. °  °ACTION TAKEN: °The pharmacy department is unable to verify this order at this time and your patient has been informed of this safety policy. °Please reevaluate patient’s clinical condition at discharge and address if the herbal or natural product(s) should be resumed at that time. ° °

## 2017-11-20 NOTE — Progress Notes (Signed)
Bilateral lower extremity venous duplex completed. There is no evidence of a DVT, superficial thrombnosis, or Baker's cyst. Toma Copier, RVS 11/20/2017 2:34 PM

## 2017-11-20 NOTE — Progress Notes (Signed)
[place] for apixaban Indication: atrial fibrillation  Allergies  Allergen Reactions  . Eucalyptol     Head stops up  . Statins Other (See Comments)    Body aches, memory loss    Patient Measurements: Height: 5\' 11"  (180.3 cm) Weight: 205 lb 9.6 oz (93.3 kg) IBW/kg (Calculated) : 75.3  Vital Signs: Temp: 98 F (36.7 C) (04/04 0828) Temp Source: Oral (04/04 0828) BP: 129/72 (04/04 0828) Pulse Rate: 100 (04/04 0456)  Labs: Recent Labs    11/20/17 0059 11/20/17 0412  HGB 15.8  --   HCT 46.2  --   PLT 233  --   CREATININE 1.29*  --   TROPONINI  --  <0.03    Estimated Creatinine Clearance: 56.8 mL/min (A) (by C-G formula based on SCr of 1.29 mg/dL (H)).   Medical History: Past Medical History:  Diagnosis Date  . Atrial flutter ([place]) 03-2012  . Chronic back pain   . Headache(784.0)    migraines x 3 in life  . Hearing loss   . Hyperlipidemia   . Hypertension   . HYPOGONADISM, MALE 10/12/2007   Followup per urology    . Melanoma ([place])    melanoma left forearm 2005- no chemo  . PROSTATE CANCER, HX OF 10/06/2008   Status post surgery approximately 2010      Medications:  Medications Prior to Admission  Medication Sig Dispense Refill Last Dose  . ALPRAZolam (XANAX) 0.5 MG tablet TAKE 1 TABLET AT BEDTIME AS NEEDED FOR ANXIETY 30 tablet 3 Past Week at Unknown time  . Ascorbic Acid (VITAMIN C) 500 MG CHEW Chew 1,000 mg by mouth daily.    11/19/2017 at Unknown time  . aspirin EC 81 MG tablet Take 81 mg by mouth daily.   11/19/2017 at Unknown time  . benazepril (LOTENSIN) 40 MG tablet TAKE 1 TABLET BY MOUTH  DAILY 90 tablet 1 11/19/2017 at Unknown time  . Cholecalciferol (VITAMIN D3) 2000 UNITS TABS Take 2,000 Units by mouth daily.    11/19/2017 at Unknown time  . Cyanocobalamin (VITAMIN B-12) 5000 MCG SUBL Place 5,000 mcg under the tongue daily.    11/19/2017 at Unknown time  . fenofibrate 160 MG tablet TAKE 1 TABLET BY MOUTH  DAILY 90  tablet 1 11/19/2017 at Unknown time  . Multiple Vitamin (MULTIVITAMIN WITH MINERALS) TABS Take 1 tablet by mouth daily.   11/19/2017 at Unknown time  . oxybutynin (DITROPAN-XL) 10 MG 24 hr tablet Take 10 mg by mouth daily.  11 11/19/2017 at Unknown time  . Turmeric 500 MG CAPS Take 1 capsule by mouth daily.   11/19/2017 at Unknown time  . verapamil (CALAN-SR) 240 MG CR tablet TAKE 1 TABLET BY MOUTH AT  BEDTIME 90 tablet 3 11/19/2017 at Unknown time   Scheduled:  . apixaban  5 mg Oral BID  . benazepril  40 mg Oral Daily  . cholecalciferol  2,000 Units Oral Daily  . fenofibrate  160 mg Oral Daily  . multivitamin with minerals  1 tablet Oral Daily  . oxybutynin  10 mg Oral Daily  . verapamil  240 mg Oral QHS  . vitamin B-12  5,000 mcg Oral Daily  . vitamin C  1,000 mg Oral Daily    Assessment: 77 yo male with history of aflutter (CHADSVASC=3). Pharmacy to dose apixaban. -SCr= 1.29  Goal of Therapy:  Monitor platelets by anticoagulation protocol: Yes   Plan:  -apixaban 5mg  po bid -Will provide patient education  Hildred Laser, PharmD Clinical Pharmacist Clinical phone from 8:30-4:00 is [phone] After 4pm, please call Main Rx [phone]) for assistance. 11/20/2017 11:09 AM

## 2017-11-20 NOTE — Care Management Obs Status (Signed)
[place] NOTIFICATION   Patient Details  Name: JOHAN CREVELING MRN: [id number] Date of Birth: 1941-04-08   Medicare Observation Status Notification Given:  Yes    Dawayne Patricia, RN 11/20/2017, 4:18 PM

## 2017-11-20 NOTE — Progress Notes (Signed)
Per insurance check for Eliquis  #  7. [name] [facility] [place] @ [employer] [facility] # [id number]    ELIQUIS 5 MG BID  COVER- YES  CO-PAY- $ 45.00 Q/L TWO PILL PER DAY  TIER- 3 DRUG  PRIOR APPROVAL- NO   DEDUCTIBLE- NOT MET   PREFERRED PHARMACY [facility]

## 2017-11-20 NOTE — Discharge Instructions (Addendum)
Information on my medicine - ELIQUIS (apixaban)  This medication education was reviewed with me or my healthcare representative as part of my discharge preparation.  Why was Eliquis prescribed for you? Eliquis was prescribed for you to reduce the risk of a blood clot forming that can cause a stroke if you have a medical condition called atrial fibrillation (a type of irregular heartbeat).  What do You need to know about Eliquis ? Take your Eliquis TWICE DAILY - one tablet in the morning and one tablet in the evening with or without food. If you have difficulty swallowing the tablet whole please discuss with your pharmacist how to take the medication safely.  Take Eliquis exactly as prescribed by your doctor and DO NOT stop taking Eliquis without talking to the doctor who prescribed the medication.  Stopping may increase your risk of developing a stroke.  Refill your prescription before you run out.  After discharge, you should have regular check-up appointments with your healthcare provider that is prescribing your Eliquis.  In the future your dose may need to be changed if your kidney function or weight changes by a significant amount or as you get older.  What do you do if you miss a dose? If you miss a dose, take it as soon as you remember on the same day and resume taking twice daily.  Do not take more than one dose of ELIQUIS at the same time to make up a missed dose.  Important Safety Information A possible side effect of Eliquis is bleeding. You should call your healthcare provider right away if you experience any of the following: ? Bleeding from an injury or your nose that does not stop. ? Unusual colored urine (red or dark brown) or unusual colored stools (red or black). ? Unusual bruising for unknown reasons. ? A serious fall or if you hit your head (even if there is no bleeding).  Some medicines may interact with Eliquis and might increase your risk of bleeding or  clotting while on Eliquis. To help avoid this, consult your healthcare provider or pharmacist prior to using any new prescription or non-prescription medications, including herbals, vitamins, non-steroidal anti-inflammatory drugs (NSAIDs) and supplements.  This website has more information on Eliquis (apixaban): http://[email or web address].com/eliquis/home   Additional discharge instructions  Please get your medications reviewed and adjusted by your Primary MD.  Please request your Primary MD to go over all Hospital Tests and Procedure/Radiological results at the follow up, please get all Hospital records sent to your Prim MD by signing hospital release before you go home.  If you had Pneumonia of Lung problems at the Hospital: Please get a 2 view Chest X ray done in 6-8 weeks after hospital discharge or sooner if instructed by your Primary MD.  If you have Congestive Heart Failure: Please call your Cardiologist or Primary MD anytime you have any of the following symptoms:  1) 3 pound weight gain in 24 hours or 5 pounds in 1 week  2) shortness of breath, with or without a dry hacking cough  3) swelling in the hands, feet or stomach  4) if you have to sleep on extra pillows at night in order to breathe  Follow cardiac low salt diet and 1.5 lit/day fluid restriction.  If you have diabetes Accuchecks 4 times/day, Once in AM empty stomach and then before each meal. Log in all results and show them to your primary doctor at your next visit. If any glucose reading is under 80   80 or above 300 call your primary MD immediately.  If you have Seizure/Convulsions/Epilepsy: Please do not drive, operate heavy machinery, participate in activities at heights or participate in high speed sports until you have seen by Primary MD or a Neurologist and advised to do so again.  If you had Gastrointestinal Bleeding: Please ask your Primary MD to check a complete blood count within one week of discharge or at your  next visit. Your endoscopic/colonoscopic biopsies that are pending at the time of discharge, will also need to followed by your Primary MD.  Get Medicines reviewed and adjusted. Please take all your medications with you for your next visit with your Primary MD  Please request your Primary MD to go over all hospital tests and procedure/radiological results at the follow up, please ask your Primary MD to get all Hospital records sent to his/her office.  If you experience worsening of your admission symptoms, develop shortness of breath, life threatening emergency, suicidal or homicidal thoughts you must seek medical attention immediately by calling 911 or calling your MD immediately  if symptoms less severe.  You must read complete instructions/literature along with all the possible adverse reactions/side effects for all the Medicines you take and that have been prescribed to you. Take any new Medicines after you have completely understood and accpet all the possible adverse reactions/side effects.   Do not drive or operate heavy machinery when taking Pain medications.   Do not take more than prescribed Pain, Sleep and Anxiety Medications  Special Instructions: If you have smoked or chewed Tobacco  in the last 2 yrs please stop smoking, stop any regular Alcohol  and or any Recreational drug use.  Wear Seat belts while driving.  Please note You were cared for by a hospitalist during your hospital stay. If you have any questions about your discharge medications or the care you received while you were in the hospital after you are discharged, you can call the unit and asked to speak with the hospitalist on call if the hospitalist that took care of you is not available. Once you are discharged, your primary care physician will handle any further medical issues. Please note that NO REFILLS for any discharge medications will be authorized once you are discharged, as it is imperative that you return to  your primary care physician (or establish a relationship with a primary care physician if you do not have one) for your aftercare needs so that they can reassess your need for medications and monitor your lab values.  You can reach the hospitalist office at phone [phone] or fax [phone]   If you do not have a primary care physician, you can call [phone] for a physician referral.   Pleurisy Pleurisy, also called pleuritis, is irritation and swelling (inflammation) of the linings of the lungs. The linings of the lungs are called pleura. They cover the outside of the lungs and the inside of the chest wall. There is a small amount of fluid (pleural fluid) between the pleura that allows the lungs to move in and out smoothly when you breathe. Pleurisy causes the pleura to be rough and dry and to rub together when you breathe, which is painful. In some cases, pleurisy can cause pleural fluid to build up between the pleura (pleural effusion). What are the causes? Common causes of this condition include:  A lung infection caused by bacteria or a virus.  A blood clot that travels to the lung (pulmonary embolism).  Air leaking  into the pleural space (pneumothorax).  Lung cancer or a lung tumor.  A chest injury.  Diseases that can cause lung inflammation. These include rheumatoid arthritis, lupus, sickle cell disease, inflammatory bowel disease, and pancreatitis.  Heart or chest surgery.  Lung damage from inhaling asbestos.  A lung reaction to certain medicines.  Sometimes the cause is unknown. What are the signs or symptoms? Chest pain is the main symptom of this condition. The pain is usually on one side. Chest pain may start suddenly and be sharp or stabbing. It may become a constant dull ache. You may also feel pain in your back or shoulder. The pain may get worse when you cough, take deep breaths, or make sudden movements. Other symptoms may include:  Shortness of  breath.  Noisy breathing (wheezing).  Cough.  Chills.  Fever.  How is this diagnosed? This condition may be diagnosed based on:  Your medical history.  Your symptoms.  A physical exam. Your health care provider will listen to your breathing with a stethoscope to check for a rough, rubbing sound (friction rub). If you have pleural effusion, your breathing sounds may be muffled.  Tests, such as: ? Blood tests to check for infections or diseases and to measure the oxygen in your blood. ? Imaging studies of your lungs. These may include a chest X-ray, ultrasound, MRI, or CT scan. ? A procedure to remove pleural fluid with a needle for testing (thoracentesis).  How is this treated? Treatment for this condition depends on the cause. Pleurisy that was caused by a virus usually clears up within 2 weeks. Treatment for pleurisy may include:  NSAIDs to help relieve pain and swelling.  Antibiotic medicines, if your condition was caused by a bacterial infection.  Prescription pain or cough medicine.  Medicines to dissolve a blood clot, if your condition was caused by pulmonary embolism.  Removal of pleural fluid or air.  Follow these instructions at home: Medicines  Take over-the-counter and prescription medicines only as told by your health care provider.  If you were prescribed an antibiotic, take it as told by your health care provider. Do not stop taking the antibiotic even if you start to feel better. Activity  Rest and return to your normal activities as told by your health care provider. Ask your health care provider what activities are safe for you.  Do not drive or use heavy machinery while taking prescription pain medicine. General instructions  Monitor your pleurisy for any changes.  Take deep breaths often, even if it is painful. This can help prevent lung infection (pneumonia) and collapse of lung tissue (atelectasis).  When lying down, lie on your painful side.  This may reduce pain.  Do not smoke. If you need help quitting, ask your health care provider.  Keep all follow-up visits as told by your health care provider. This is important. Contact a health care provider if:  You have pain that: ? Gets worse. ? Does not get better with medicine. ? Lasts for more than 1 week.  You have a fever or chills.  Your cough or shortness of breath is not improving at home.  You cough up pus-like (purulent) secretions. Get help right away if:  Your lips, fingernails, or toenails darken or turn blue.  You cough up blood.  You have any of the following symptoms that get worse: ? Difficulty breathing. ? Shortness of breath. ? Wheezing.  You have pain that spreads into your neck, arms, or jaw.  You develop a rash.  You vomit.  You faint. Summary  Pleurisy is inflammation of the linings of the lungs (pleura).  Pleurisy causes pain that makes it difficult for you to breathe or cough.  Pleurisy is often caused by an underlying infection or disease.  Treatment of pleurisy depends on the cause, and it often includes medicines. This information is not intended to replace advice given to you by your health care provider. Make sure you discuss any questions you have with your health care provider. Document Released: 08/05/2005 Document Revised: 04/29/2016 Document Reviewed: 04/29/2016 Elsevier Interactive Patient Education  2017 [employer]    Pleural Effusion A pleural effusion is an abnormal buildup of fluid in the layers of tissue between your lungs and the inside of your chest (pleural space). These two layers of tissue that line both your lungs and the inside of your chest are called pleura. Usually, there is no air in the space between the pleura, only a thin layer of fluid. If left untreated, a large amount of fluid can build up and cause the lung to collapse. A pleural effusion is usually caused by another disease that requires  treatment. The two main types of pleural effusion are:  Transudative pleural effusion. This happens when fluid leaks into the pleural space because of a low protein count in your blood or high blood pressure in your vessels. Heart failure often causes this.  Exudative infusion. This occurs when fluid collects in the pleural space from blocked blood vessels or lymph vessels. Some lung diseases, injuries, and cancers can cause this type of effusion.  What are the causes? Pleural effusion can be caused by:  Heart failure.  A blood clot in the lung (pulmonary embolism).  Pneumonia.  Cancer.  Liver failure (cirrhosis).  Kidney disease.  Complications from surgery, such as from open heart surgery.  What are the signs or symptoms? In some cases, pleural effusion may cause no symptoms. Symptoms can include:  Shortness of breath, especially when lying down.  Chest pain, often worse when taking a deep breath.  Fever.  Dry cough that is lasting (chronic).  Hiccups.  Rapid breathing.  An underlying condition that is causing the pleural effusion (such as heart failure, pneumonia, blood clots, tuberculosis, or cancer) may also cause additional symptoms. How is this diagnosed? Your health care provider may suspect pleural effusion based on your symptoms and medical history. Your health care provider will also do a physical exam and a chest X-ray. If the X-ray shows there is fluid in your chest, you may need to have this fluid removed using a needle (thoracentesis) so it can be tested. You may also have:  Imaging studies of the chest, such as: ? Ultrasound. ? CT scan.  Blood tests for kidney and liver function.  How is this treated? Treatment depends on the cause of the pleural effusion. Treatment may include:  Taking antibiotic medicines to clear up an infection that is causing the pleural effusion.  Placing a tube in the chest to drain the effusion (tube thoracostomy). This  procedure is often used when there is an infection in the fluid.  Surgery to remove the fibrous outer layer of tissue from the pleural space (decortication).  Thoracentesis, which can improve cough and shortness of breath.  A procedure to put medicine into the chest cavity to seal the pleural space to prevent fluid buildup (pleurodesis).  Chemotherapy and radiation therapy. These may be required in the case of cancerous (malignant) pleural  effusion.  Follow these instructions at home:  Take medicines only as directed by your health care provider.  Keep track of how long you can gently exercise before you get short of breath. Try simply walking at first.  Do not use any tobacco products, including cigarettes, chewing tobacco, or electronic cigarettes. If you need help quitting, ask your health care provider.  Keep all follow-up visits as directed by your health care provider. This is important. Contact a health care provider if:  The amount of time that you are able to exercise decreases or does not improve with time.  You have pain or signs of infection at the puncture site if you had thoracentesis. Watch for: ? Drainage. ? Redness. ? Swelling.  You have a fever. Get help right away if:  You are short of breath.  You develop chest pain.  You develop a new cough. This information is not intended to replace advice given to you by your health care provider. Make sure you discuss any questions you have with your health care provider. Document Released: 08/05/2005 Document Revised: 01/08/2016 Document Reviewed: 12/29/2013 [employer] Interactive Patient Education  [employer].

## 2017-11-20 NOTE — H&P (Addendum)
History and Physical    [name] SWF:[id number] DOB: 04-03-1941 DOA: 11/20/2017  Referring MD/NP/PA:   PCP: Lucille Passy, MD   Patient coming from:  The patient is coming from home.  At baseline, pt is independent for most of ADL.   Chief Complaint: chest pain, shortness breath  HPI: [name] is a 77 y.o. male with medical history significant of hypertension, hyperlipidemia, prostate cancer (prostatectomy), atrial fibrillation (s/p of ablation) not on anticoagulants, CKD-3, who presents with chest pain or shortness of breath.  Patient states that his chest pain started in the middle night. It is located in the left side of chest, sharp, 7 out of 10 in severity, nonradiating. It is pleuritic, aggravated by deep breath. Patient has shortness of breath, but no cough, fever or chills. No recent long distant traveling. No tenderness in calf areas.Patient denies nausea, vomiting, diarrhea, abdominal pain. Patient states that sometimes he has burning on urination, but no dysuria or increased urinary frequency now.  ED Course: pt was found to have WBC 8.2, negative troponin, d-dimer positive at 0.55, stable renal function, temperature normal, atrial fibrillation with RVR, with heart rate up to 137, no tachypnea, oxygen saturation 97% on room air. Pending UA. Chest x-ray showed small left pleural effusion with questionable left base infiltration. Pt is placed on telemetry bed for observation.  Review of Systems:   General: no fevers, chills, no body weight gain, has fatigue HEENT: no blurry vision, hearing changes or sore throat Respiratory: has dyspnea, no coughing, wheezing CV: has chest pain, no palpitations GI: no nausea, vomiting, abdominal pain, diarrhea, constipation GU: no dysuria, has burning on urination, no increased urinary frequency, hematuria  Ext: no leg edema Neuro: no unilateral weakness, numbness, or tingling, no vision change or hearing loss Skin: no rash, no skin  tear. MSK: No muscle spasm, no deformity, no limitation of range of movement in spin Heme: No easy bruising.  Travel history: No recent long distant travel.  Allergy:  Allergies  Allergen Reactions  . Eucalyptol     Head stops up  . Statins Other (See Comments)    Body aches, memory loss    Past Medical History:  Diagnosis Date  . Atrial flutter ([place]) 03-2012  . Chronic back pain   . Headache(784.0)    migraines x 3 in life  . Hearing loss   . Hyperlipidemia   . Hypertension   . HYPOGONADISM, MALE 10/12/2007   Followup per urology    . Melanoma ([place])    melanoma left forearm 2005- no chemo  . PROSTATE CANCER, HX OF 10/06/2008   Status post surgery approximately 2010      Past Surgical History:  Procedure Laterality Date  . ABLATION  01-24-2014   repeat CTI ablation by Dr Lovena Le  . ABLATION OF DYSRHYTHMIC FOCUS  10/15/2012   CTI ablation by Dr Lovena Le for atrial flutter  . arhtroscopy  right knee  2010  . ATRIAL FLUTTER ABLATION N/A 10/15/2012   Procedure: ATRIAL FLUTTER ABLATION;  Surgeon: Evans Lance, MD;  Location: [facility] CATH LAB;  Service: Cardiovascular;  Laterality: N/A;  . ATRIAL FLUTTER ABLATION N/A 01/24/2014   Procedure: ATRIAL FLUTTER ABLATION;  Surgeon: Evans Lance, MD;  Location: [facility] CATH LAB;  Service: Cardiovascular;  Laterality: N/A;  . BACK SURGERY  07/2006   lumbar discectomy  . CARDIAC CATHETERIZATION     15 years ago  . CARDIOVERSION  05/18/2012   Procedure: CARDIOVERSION;  Surgeon: Ander Slade  [place], MD;  Location: [facility] ENDOSCOPY;  Service: Cardiovascular;  Laterality: N/A;  on eloquis on-going  . COLONOSCOPY W/ POLYPECTOMY    . LUMBAR LAMINECTOMY/DECOMPRESSION MICRODISCECTOMY  08/16/2011   Procedure: LUMBAR LAMINECTOMY/DECOMPRESSION MICRODISCECTOMY;  Surgeon: Dahlia Bailiff;  Location: [facility] ORS;  Service: Orthopedics;  Laterality: N/A;  Lumbar Decompression and Foraminotomy L4-S1    . MELANOMA EXCISION  12/13/03   [place] [facility]  . PROSTATECTOMY  07/2008  .  ROTATOR CUFF REPAIR  2008   right; Dr Justice Britain  . TONSILLECTOMY  1947    Social History:  reports that he quit smoking about 33 years ago. His smoking use included cigarettes. He has a 20.00 pack-year smoking history. He has never used smokeless tobacco. He reports that he drinks about 1.2 oz of alcohol per week. He reports that he does not use drugs.  Family History:  Family History  Problem Relation Age of Onset  . Pancreatic cancer Mother   . Colon cancer Brother   . Esophageal cancer Neg Hx   . Stomach cancer Neg Hx   . Rectal cancer Neg Hx      Prior to Admission medications   Medication Sig Start Date End Date Taking? Authorizing Provider  ALPRAZolam Duanne Moron) 0.5 MG tablet TAKE 1 TABLET AT BEDTIME AS NEEDED FOR ANXIETY 07/14/17   Lucille Passy, MD  Ascorbic Acid (VITAMIN C) 500 MG CHEW Pt takes 2 chewables daily.    [provider]  ASPIRIN EC PO Take by mouth 2 (two) times daily.    [provider]  benazepril (LOTENSIN) 40 MG tablet TAKE 1 TABLET BY MOUTH  DAILY 11/17/17   Lucille Passy, MD  Cholecalciferol (VITAMIN D3) 2000 UNITS TABS Take by mouth.    [provider]  Cyanocobalamin (VITAMIN B-12) 5000 MCG SUBL Place under the tongue.    [provider]  fenofibrate 160 MG tablet TAKE 1 TABLET BY MOUTH  DAILY 11/17/17   Lucille Passy, MD  Multiple Vitamin (MULTIVITAMIN WITH MINERALS) TABS Take 1 tablet by mouth daily.    [provider]  naproxen sodium (ALEVE) 220 MG tablet Take 1 tablet by mouth daily. 05/30/16   [provider]  Turmeric 500 MG CAPS Take 1 capsule by mouth daily.    [provider]  verapamil (CALAN-SR) 240 MG CR tablet TAKE 1 TABLET BY MOUTH AT  BEDTIME 04/17/17   Lucille Passy, MD  pravastatin (PRAVACHOL) 20 MG tablet Take 1 tablet (20 mg total) by mouth daily. 10/10/11 11/12/11  Colon Branch, MD    Physical Exam: Vitals:   11/20/17 0156 11/20/17 0215 11/20/17 0230 11/20/17 0245  BP: 122/84  120/71 116/61 114/70  Pulse: 80 87 97 68  Resp: 13 19 19 18   Temp:      TempSrc:      SpO2: 97% 95% 92% 93%   General: Not in acute distress HEENT:       Eyes: PERRL, EOMI, no scleral icterus.       ENT: No discharge from the ears and nose, no pharynx injection, no tonsillar enlargement.        Neck: No JVD, no bruit, no mass felt. Heme: No neck lymph node enlargement. Cardiac: S1/S2, RRR, No murmurs, No gallops or rubs. Respiratory: No rales, wheezing, rhonchi or rubs. GI: Soft, nondistended, nontender, no rebound pain, no organomegaly, BS present. GU: No hematuria Ext: No pitting leg edema bilaterally. 2+DP/PT pulse bilaterally. Musculoskeletal: No joint deformities, No joint redness or  warmth, no limitation of ROM in spin. Skin: No rashes.  Neuro: Alert, oriented X3, cranial nerves II-XII grossly intact, moves all extremities normally.  Psych: Patient is not psychotic, no suicidal or hemocidal ideation.  Labs on Admission: I have personally reviewed following labs and imaging studies  CBC: Recent Labs  Lab 11/20/17 0059  WBC 8.2  HGB 15.8  HCT 46.2  MCV 91.5  PLT 161   Basic Metabolic Panel: Recent Labs  Lab 11/20/17 0059  NA 137  K 3.7  CL 106  CO2 22  GLUCOSE 165*  BUN 17  CREATININE 1.29*  CALCIUM 9.4   GFR: CrCl cannot be calculated (Unknown ideal weight.). Liver Function Tests: No results for input(s): AST, ALT, ALKPHOS, BILITOT, PROT, ALBUMIN in the last 168 hours. No results for input(s): LIPASE, AMYLASE in the last 168 hours. No results for input(s): AMMONIA in the last 168 hours. Coagulation Profile: No results for input(s): INR, PROTIME in the last 168 hours. Cardiac Enzymes: No results for input(s): CKTOTAL, CKMB, CKMBINDEX, TROPONINI in the last 168 hours. BNP (last 3 results) No results for input(s): PROBNP in the last 8760 hours. HbA1C: No results for input(s): HGBA1C in the last 72 hours. CBG: No results for input(s): GLUCAP in the last  168 hours. Lipid Profile: No results for input(s): CHOL, HDL, LDLCALC, TRIG, CHOLHDL, LDLDIRECT in the last 72 hours. Thyroid Function Tests: No results for input(s): TSH, T4TOTAL, FREET4, T3FREE, THYROIDAB in the last 72 hours. Anemia Panel: No results for input(s): VITAMINB12, FOLATE, FERRITIN, TIBC, IRON, RETICCTPCT in the last 72 hours. Urine analysis:    Component Value Date/Time   COLORURINE YELLOW 01/14/2017 1011   APPEARANCEUR CLEAR 01/14/2017 1011   LABSPEC >=1.030 (A) 01/14/2017 1011   PHURINE 6.0 01/14/2017 1011   GLUCOSEU NEGATIVE 01/14/2017 1011   HGBUR NEGATIVE 01/14/2017 1011   HGBUR negative 05/26/2009 1408   BILIRUBINUR NEGATIVE 01/14/2017 1011   BILIRUBINUR neg 05/18/2015 1507   KETONESUR NEGATIVE 01/14/2017 1011   PROTEINUR pos 05/18/2015 1507   UROBILINOGEN 0.2 01/14/2017 1011   NITRITE NEGATIVE 01/14/2017 [place] 01/14/2017 1011   Sepsis Labs: @LABRCNTIP (procalcitonin:4,lacticidven:4) )No results found for this or any previous visit (from the past 240 hour(s)).   Radiological Exams on Admission: Dg Chest 2 View  Result Date: 11/20/2017 CLINICAL DATA:  Chest pain EXAM: CHEST - 2 VIEW COMPARISON:  08/09/2011 FINDINGS: Small left-sided pleural effusion. Airspace disease at the left lung base. Normal heart size. No pneumothorax. IMPRESSION: Small left-sided pleural effusion with left basilar airspace disease suspicious for pneumonia. Radiographic follow-up to resolution recommended. Electronically Signed   By: Donavan Foil M.D.   On: 11/20/2017 01:25     EKG: Independently reviewed.  Atrial fibrillation, QTC 482, LAD, poor R-wave progression  Assessment/Plan Principal Problem:   Chest pain Active Problems:   Hyperlipidemia   Essential hypertension   Atrial fibrillation with RVR (HCC)   CKD (chronic kidney disease), stage III (HCC)   Chest pain: patient has pleuritic chest pain, associated with shortness rest. Chest x-ray showed  small left pleural effusion, and a questionable left basilar infiltration. Patient does not have fever or leukocytosis, no cough, clinically does not seem to have pneumonia. Differential diagnoses include PE and pleurisy. Will also need to rule out ACS.   - will place on Tele bed for obs - f/u CTA of chest to r/o PE-->negative for PE - cycle CE q6 x3 and repeat EKG in the am  - prn Nitroglycerin, Morphine,  and aspirin, fenofibrate - Risk factor stratification: will check FLP, UDS and A1C  - 2d echo - LE doppler to r/o DVT --inpt card consult was requested via Epic  Hyperlipidemia: patient is allergic to statin -fenofibrate  HTN:  -Continue home medications: Lotensin,verapamil -IV hydralazine prn  Atrial fibrillation with RVR ([place]): s/p of ablation. CHA2DS2-VASc Score is 3, needs oral anticoagulation, but patient is not on AC, not sure why he is not on AC. Initial HR was upto 137, which improved to 80s now. May need to discuss with his Cardiologist, Dr. Lovena Le regarding anticoagulant use. -continue verapamil  CKD (chronic kidney disease), stage III ([place]): stable. Baseline creatinine 1.3-1.4. His creatinine is 1.29, BUN 17 -Follow-up renal function by BMP   DVT ppx: SQ Lovenox Code Status: Full code Family Communication: None at bed side.    Disposition Plan:  Anticipate discharge back to previous home environment Consults called:  none Admission status: Obs / tele     Date of Service 11/20/2017    Ivor Costa [facility] Pager [phone]  If 7PM-7AM, please contact night-coverage [email or web address] Password [email or web address] 11/20/2017, 4:40 AM

## 2017-11-20 NOTE — Consult Note (Addendum)
Cardiology Consultation:   Patient ID: [name]; [id number]; 1941/04/20   Admit date: 11/20/2017 Date of Consult: 11/20/2017  Primary Care Provider: Lucille Passy, MD Primary Cardiologist: Cristopher Peru, MD  Primary Electrophysiologist:     Patient Profile:   [name] is a 77 y.o. male with a hx of atrial flutter s/p ablations x 2 (2014, 2015) not on anticoagulation (followed by Dr. Lovena Le), HTN, HLD, prostate cancer, and CKD stage III who is being seen today for the evaluation of chest pain and Afib at the request of Dr. Starla Link.  History of Present Illness:   [name] is known to this service and last saw Dr. Lovena Le in clinic on 09/11/16. At that time, he was in his usual state of health aside from sleeping problems. He was noted to be in NSR with first degree heart block.     He presented to [facility] with new onset chest pain. He states that the chest pain work him from sleep, is located in his left anterior chest. The chest pain is worse with deep inspiration and radiates to his back when he takes a deep breath.  The pain has been constant since he arrived. There is no radiation. He also reports a similar chest pain about 1 week ago when he was reading in bed. He took some tums and the pain subsided. He also states that he has not felt like himself over the past week, but can't describe his symptoms/sensations. On exam, it is painful for him to take a deep breath.  Troponin x 2 negative. EKG with Afib. CXR with left pleural effusion and PNA. He is afebrile and without leukocytosis. This may be an atypical PNA. CTA negative for PE, showed medium-sized left pleural effusion, and no aortic atherosclerosis, but evidence of coronary artery calcifications.    Past Medical History:  Diagnosis Date  . Atrial flutter ([place]) 03-2012  . Chronic back pain   . Headache(784.0)    migraines x 3 in life  . Hearing loss   . Hyperlipidemia   . Hypertension   . HYPOGONADISM, MALE 10/12/2007   Followup per urology    . Melanoma ([place])    melanoma left forearm 2005- no chemo  . PROSTATE CANCER, HX OF 10/06/2008   Status post surgery approximately 2010      Past Surgical History:  Procedure Laterality Date  . ABLATION  01-24-2014   repeat CTI ablation by Dr Lovena Le  . ABLATION OF DYSRHYTHMIC FOCUS  10/15/2012   CTI ablation by Dr Lovena Le for atrial flutter  . arhtroscopy  right knee  2010  . ATRIAL FLUTTER ABLATION N/A 10/15/2012   Procedure: ATRIAL FLUTTER ABLATION;  Surgeon: Evans Lance, MD;  Location: [facility] CATH LAB;  Service: Cardiovascular;  Laterality: N/A;  . ATRIAL FLUTTER ABLATION N/A 01/24/2014   Procedure: ATRIAL FLUTTER ABLATION;  Surgeon: Evans Lance, MD;  Location: [facility] CATH LAB;  Service: Cardiovascular;  Laterality: N/A;  . BACK SURGERY  07/2006   lumbar discectomy  . CARDIAC CATHETERIZATION     15 years ago  . CARDIOVERSION  05/18/2012   Procedure: CARDIOVERSION;  Surgeon: Peter M Martinique, MD;  Location: [facility] ENDOSCOPY;  Service: Cardiovascular;  Laterality: N/A;  on eloquis on-going  . COLONOSCOPY W/ POLYPECTOMY    . LUMBAR LAMINECTOMY/DECOMPRESSION MICRODISCECTOMY  08/16/2011   Procedure: LUMBAR LAMINECTOMY/DECOMPRESSION MICRODISCECTOMY;  Surgeon: Dahlia Bailiff;  Location: [facility] ORS;  Service: Orthopedics;  Laterality: N/A;  Lumbar Decompression and Foraminotomy L4-S1    .  MELANOMA EXCISION  12/13/03   [place] [facility]  . PROSTATECTOMY  07/2008  . ROTATOR CUFF REPAIR  2008   right; Dr Justice Britain  . TONSILLECTOMY  1947     Home Medications:  Prior to Admission medications   Medication Sig Start Date End Date Taking? Authorizing Provider  ALPRAZolam Duanne Moron) 0.5 MG tablet TAKE 1 TABLET AT BEDTIME AS NEEDED FOR ANXIETY 07/14/17  Yes Lucille Passy, MD  Ascorbic Acid (VITAMIN C) 500 MG CHEW Chew 1,000 mg by mouth daily.    Yes [provider]  aspirin EC 81 MG tablet Take 81 mg by mouth daily.   Yes [provider]  benazepril (LOTENSIN) 40 MG tablet  TAKE 1 TABLET BY MOUTH  DAILY 11/17/17  Yes Lucille Passy, MD  Cholecalciferol (VITAMIN D3) 2000 UNITS TABS Take 2,000 Units by mouth daily.    Yes [provider]  Cyanocobalamin (VITAMIN B-12) 5000 MCG SUBL Place 5,000 mcg under the tongue daily.    Yes [provider]  fenofibrate 160 MG tablet TAKE 1 TABLET BY MOUTH  DAILY 11/17/17  Yes Lucille Passy, MD  Multiple Vitamin (MULTIVITAMIN WITH MINERALS) TABS Take 1 tablet by mouth daily.   Yes [provider]  oxybutynin (DITROPAN-XL) 10 MG 24 hr tablet Take 10 mg by mouth daily. 11/07/17  Yes [provider]  Turmeric 500 MG CAPS Take 1 capsule by mouth daily.   Yes [provider]  verapamil (CALAN-SR) 240 MG CR tablet TAKE 1 TABLET BY MOUTH AT  BEDTIME 04/17/17  Yes Lucille Passy, MD  pravastatin (PRAVACHOL) 20 MG tablet Take 1 tablet (20 mg total) by mouth daily. 10/10/11 11/12/11  Colon Branch, MD    Inpatient Medications: Scheduled Meds: . aspirin  325 mg Oral Daily  . benazepril  40 mg Oral Daily  . cholecalciferol  2,000 Units Oral Daily  . enoxaparin (LOVENOX) injection  40 mg Subcutaneous Daily  . fenofibrate  160 mg Oral Daily  . multivitamin with minerals  1 tablet Oral Daily  . oxybutynin  10 mg Oral Daily  . verapamil  240 mg Oral QHS  . vitamin B-12  5,000 mcg Oral Daily  . vitamin C  1,000 mg Oral Daily   Continuous Infusions: . sodium chloride 100 mL/hr at 11/20/17 0512   PRN Meds: acetaminophen, ALPRAZolam, hydrALAZINE, morphine injection, nitroGLYCERIN, ondansetron (ZOFRAN) IV, zolpidem  Allergies:    Allergies  Allergen Reactions  . Eucalyptol     Head stops up  . Statins Other (See Comments)    Body aches, memory loss    Social History:   Social History   Socioeconomic History  . Marital status: Married    Spouse name: Not on file  . Number of children: 2  . Years of education: Not on file  . Highest education level: Not on file  Occupational History  .  Occupation: Film/video editor  Social Needs  . Financial resource strain: Not on file  . Food insecurity:    Worry: Not on file    Inability: Not on file  . Transportation needs:    Medical: Not on file    Non-medical: Not on file  Tobacco Use  . Smoking status: Former Smoker    Packs/day: 1.00    Years: 20.00    Pack years: 20.00    Types: Cigarettes    Last attempt to quit: 08/08/1984    Years since quitting: 33.3  . Smokeless tobacco: Never  Used  Substance and Sexual Activity  . Alcohol use: Yes    Alcohol/week: 1.2 oz    Types: 2 Cans of beer per week    Comment: socially   2 beer or mixed drinks week  . Drug use: No  . Sexual activity: Never  Lifestyle  . Physical activity:    Days per week: Not on file    Minutes per session: Not on file  . Stress: Not on file  Relationships  . Social connections:    Talks on phone: Not on file    Gets together: Not on file    Attends religious service: Not on file    Active member of club or organization: Not on file    Attends meetings of clubs or organizations: Not on file    Relationship status: Not on file  . Intimate partner violence:    Fear of current or ex partner: Not on file    Emotionally abused: Not on file    Physically abused: Not on file    Forced sexual activity: Not on file  Other Topics Concern  . Not on file  Social History Narrative   in [place] since '84   Desires CPR    Family History:    Family History  Problem Relation Age of Onset  . Pancreatic cancer Mother   . Colon cancer Brother   . Esophageal cancer Neg Hx   . Stomach cancer Neg Hx   . Rectal cancer Neg Hx      ROS:  Please see the history of present illness.   All other ROS reviewed and negative.     Physical Exam/Data:   Vitals:   11/20/17 0245 11/20/17 0436 11/20/17 0456 11/20/17 0828  BP: 114/70 124/84 (!) 154/89 129/72  Pulse: 68 85 100   Resp: 18 20 20    Temp:   98.5 F (36.9 C) 98 F (36.7 C)  TempSrc:   Oral  Oral  SpO2: 93% 96%    Weight:   205 lb 9.6 oz (93.3 kg)   Height:   5\' 11"  (1.803 m)     Intake/Output Summary (Last 24 hours) at 11/20/2017 0919 Last data filed at 11/20/2017 0600 Gross per 24 hour  Intake 80 ml  Output -  Net 80 ml   Filed Weights   11/20/17 0456  Weight: 205 lb 9.6 oz (93.3 kg)   Body mass index is 28.68 kg/m.  General:  Well nourished, well developed, in no acute distress HEENT: normal Neck: no JVD Vascular: No carotid bruits Cardiac:  Irregular rhythm, regular rate Lungs:  clear to auscultation bilaterally, no wheezing, rhonchi or rales, slightly diminished in left base Abd: soft, nontender, no hepatomegaly  Ext: no edema Musculoskeletal:  No deformities, BUE and BLE strength normal and equal Skin: warm and dry  Neuro:  CNs 2-12 intact, no focal abnormalities noted Psych:  Normal affect   EKG:  The EKG was personally reviewed and demonstrates:  Afib Telemetry:  Telemetry was personally reviewed and demonstrates:  Rate-controlled AFib  Relevant CV Studies:  Echo pending   Echo 11/28/16: Study Conclusions - Left ventricle: The cavity size was normal. Wall thickness was   increased in a pattern of mild LVH. Systolic function was normal.   The estimated ejection fraction was in the range of 55% to 60%.   Wall motion was normal; there were no regional wall motion   abnormalities. Doppler parameters are consistent with abnormal   left ventricular relaxation (grade  1 diastolic dysfunction). The   E/e&' ratio is between 8-15, suggesting indeterminate LV filling   pressure. - Aortic valve: Sclerosis without stenosis. There was trivial   regurgitation. - Left atrium: The atrium was normal in size. - Inferior vena cava: The vessel was normal in size. The   respirophasic diameter changes were in the normal range (= 50%),   consistent with normal central venous pressure.  Impressions: - Compared to a prior study in 2013, there are few  changes.   Laboratory Data:  Chemistry Recent Labs  Lab 11/20/17 0059  NA 137  K 3.7  CL 106  CO2 22  GLUCOSE 165*  BUN 17  CREATININE 1.29*  CALCIUM 9.4  GFRNONAA 52*  GFRAA >60  ANIONGAP 9    No results for input(s): PROT, ALBUMIN, AST, ALT, ALKPHOS, BILITOT in the last 168 hours. Hematology Recent Labs  Lab 11/20/17 0059  WBC 8.2  RBC 5.05  HGB 15.8  HCT 46.2  MCV 91.5  MCH 31.3  MCHC 34.2  RDW 13.2  PLT 233   Cardiac Enzymes Recent Labs  Lab 11/20/17 0412  TROPONINI <0.03    Recent Labs  Lab 11/20/17 0103  TROPIPOC 0.02    BNP Recent Labs  Lab 11/20/17 0223  BNP 91.3    DDimer  Recent Labs  Lab 11/20/17 0217  DDIMER 0.55*    Radiology/Studies:  Dg Chest 2 View  Result Date: 11/20/2017 CLINICAL DATA:  Chest pain EXAM: CHEST - 2 VIEW COMPARISON:  08/09/2011 FINDINGS: Small left-sided pleural effusion. Airspace disease at the left lung base. Normal heart size. No pneumothorax. IMPRESSION: Small left-sided pleural effusion with left basilar airspace disease suspicious for pneumonia. Radiographic follow-up to resolution recommended. Electronically Signed   By: Donavan Foil M.D.   On: 11/20/2017 01:25   Ct Angio Chest Pe W And/or Wo Contrast  Result Date: 11/20/2017 CLINICAL DATA:  Left chest pain history of prostate cancer. Shortness of breath. EXAM: CT ANGIOGRAPHY CHEST WITH CONTRAST TECHNIQUE: Multidetector CT imaging of the chest was performed using the standard protocol during bolus administration of intravenous contrast. Multiplanar CT image reconstructions and MIPs were obtained to evaluate the vascular anatomy. CONTRAST:  152mL ISOVUE-370 IOPAMIDOL (ISOVUE-370) INJECTION 76% COMPARISON:  Chest radiograph 11/20/2017 FINDINGS: Cardiovascular: Contrast injection is sufficient to demonstrate satisfactory opacification of the pulmonary arteries to the segmental level. There is no pulmonary embolus. The main pulmonary artery is within normal limits  for size. There is a normal 3-vessel arch branching pattern without evidence of acute aortic syndrome. There is noaortic atherosclerosis. Heart size is normal, without pericardial effusion. There are coronary artery calcifications. Mediastinum/Nodes: No mediastinal, hilar or axillary lymphadenopathy. The visualized thyroid and thoracic esophageal course are unremarkable. Lungs/Pleura: Medium-sized left pleural effusion. There is associated left basilar atelectasis. Upper Abdomen: Contrast bolus timing is not optimized for evaluation of the abdominal organs. Within this limitation, the visualized organs of the upper abdomen are normal. Musculoskeletal: Flowing midthoracic osteophytosis. Review of the MIP images confirms the above findings. IMPRESSION: 1. No pulmonary embolus. 2. Medium-sized left pleural effusion with associated atelectasis. Electronically Signed   By: Ulyses Jarred M.D.   On: 11/20/2017 05:30    Assessment and Plan:   1. Pleuritic chest pain, Pneumonia - troponin x 2 negative - EKG with Afib RVR He has CXR evidence of left pleural effusion and PNA. The patient has not felt well for the past week. This is not consistent with ACS. His pleuritic chest pain, pleural effusion, and  clinical picture are consistent with a mild pneumonia. In consultation with pharmacy, they recommend 100 mg doxycycline BID x 7 days. No further ischemic evaluation at this time.    2. Hx of atrial flutter, s/p ablations x 2 (2014, 2015) Previously in NSR and appropriately not on anticoagulation. He has been in sinus rhythm. He is largely unaware of his rhythm. He is currently better rate-controlled on telemetry. I suspect this Afib is related to his PNA.  Will repeat echo with this new Afib (last echo in 2018). This patients CHA2DS2-VASc Score and unadjusted Ischemic Stroke Rate (% per year) is equal to 2.2 % stroke rate/year from a score of 2 (HTN, age).   Will stop ASA and start eliquis 5 mg BID.    3.  HTN Home regimen is benzapril, and verapamil.    4. HLD Continue fenofibrate and ASA    For questions or updates, please contact [facility] Please consult [email or web address] for contact info under Cardiology/STEMI.   Signed, Tami Lin Duke, PA  11/20/2017 9:19 AM As above, patient seen and examined.  Briefly he is a 77 year old male with past medical history of atrial flutter status post prior ablation x2, hypertension, hyperlipidemia, prostate cancer, chronic stage III kidney disease for evaluation of chest pain and atrial fibrillation.  Patient states that for the past 2 weeks he has not felt well.  He notes fatigue with activities and mild dyspnea on exertion.  No palpitations or syncope.  He denies fevers but states he has felt "cold".  He has not had productive cough or hemoptysis.  He awoke with left-sided chest pain early a.m.  The pain increased with inspiration.  It has been continuous since then.  Cardiology now asked to evaluate.  Note he does not typically have exertional chest pain.  Chest CT shows no pulmonary embolus but there is a left pleural effusion.  Creatinine is 1.29, enzymes negative.  Hemoglobin 15.8.  Electrocardiogram shows atrial fibrillation with RV conduction delay.  Prior septal infarct cannot be excluded. 1 paroxysmal atrial fibrillation-patient presented in atrial fibrillation but has now converted to sinus rhythm.  We will arrange an echocardiogram to assess LV function.  Check TSH.  Continue verapamil for rate control if atrial fibrillation recurs. CHADSvasc 3.  Discontinue aspirin.  Begin apixaban 5 mg twice daily.    2 chest pain-Patient's chest pain is pleuritic.  It does not appear to be consistent with ischemia.  Enzymes negative.  No ST changes.  Await echocardiogram.  If LV function normal with normal wall motion we will not pursue further ischemia evaluation unless recurrent symptoms in the future.  3 left pleural effusion-etiology unclear.  Question  pneumonia further evaluation and therapy per primary service.  4 Hypertension-blood pressure is controlled.  Continue present medications.  If LV function normal patient can be discharged from a cardiac standpoint once left pleural effusion addressed.  Kirk Ruths, MD

## 2017-11-21 ENCOUNTER — Observation Stay (HOSPITAL_COMMUNITY): Payer: Medicare Other

## 2017-11-21 DIAGNOSIS — N183 Chronic kidney disease, stage 3 (moderate): Secondary | ICD-10-CM | POA: Diagnosis not present

## 2017-11-21 DIAGNOSIS — R0781 Pleurodynia: Secondary | ICD-10-CM

## 2017-11-21 DIAGNOSIS — I10 Essential (primary) hypertension: Secondary | ICD-10-CM | POA: Diagnosis not present

## 2017-11-21 DIAGNOSIS — R071 Chest pain on breathing: Secondary | ICD-10-CM

## 2017-11-21 DIAGNOSIS — R072 Precordial pain: Secondary | ICD-10-CM | POA: Diagnosis not present

## 2017-11-21 DIAGNOSIS — J90 Pleural effusion, not elsewhere classified: Secondary | ICD-10-CM

## 2017-11-21 DIAGNOSIS — Z9889 Other specified postprocedural states: Secondary | ICD-10-CM | POA: Diagnosis not present

## 2017-11-21 DIAGNOSIS — I480 Paroxysmal atrial fibrillation: Secondary | ICD-10-CM | POA: Diagnosis not present

## 2017-11-21 DIAGNOSIS — I4891 Unspecified atrial fibrillation: Secondary | ICD-10-CM | POA: Diagnosis not present

## 2017-11-21 LAB — COMPREHENSIVE METABOLIC PANEL
ALK PHOS: 65 U/L (ref 38–126)
ALT: 20 U/L (ref 17–63)
AST: 22 U/L (ref 15–41)
Albumin: 2.7 g/dL — ABNORMAL LOW (ref 3.5–5.0)
Anion gap: 11 (ref 5–15)
BUN: 16 mg/dL (ref 6–20)
CO2: 22 mmol/L (ref 22–32)
CREATININE: 1.32 mg/dL — AB (ref 0.61–1.24)
Calcium: 8.3 mg/dL — ABNORMAL LOW (ref 8.9–10.3)
Chloride: 104 mmol/L (ref 101–111)
GFR calc Af Amer: 59 mL/min — ABNORMAL LOW (ref >60)
GFR, EST NON AFRICAN AMERICAN: 51 mL/min — AB (ref >60)
GLUCOSE: 127 mg/dL — AB (ref 65–99)
Potassium: 3.8 mmol/L (ref 3.5–5.1)
Sodium: 137 mmol/L (ref 135–145)
TOTAL PROTEIN: 6 g/dL — AB (ref 6.5–8.1)
Total Bilirubin: 0.8 mg/dL (ref 0.3–1.2)

## 2017-11-21 LAB — CBC WITH DIFFERENTIAL/PLATELET
BASOS ABS: 0 10*3/uL (ref 0.0–0.1)
Basophils Relative: 0 %
Eosinophils Absolute: 0.2 10*3/uL (ref 0.0–0.7)
Eosinophils Relative: 2 %
HEMATOCRIT: 42.3 % (ref 39.0–52.0)
HEMOGLOBIN: 14.1 g/dL (ref 13.0–17.0)
LYMPHS PCT: 22 %
Lymphs Abs: 1.8 10*3/uL (ref 0.7–4.0)
MCH: 30.5 pg (ref 26.0–34.0)
MCHC: 33.3 g/dL (ref 30.0–36.0)
MCV: 91.4 fL (ref 78.0–100.0)
MONO ABS: 1.4 10*3/uL — AB (ref 0.1–1.0)
Monocytes Relative: 18 %
NEUTROS ABS: 4.7 10*3/uL (ref 1.7–7.7)
NEUTROS PCT: 58 %
Platelets: 217 10*3/uL (ref 150–400)
RBC: 4.63 MIL/uL (ref 4.22–5.81)
RDW: 12.9 % (ref 11.5–15.5)
WBC: 8 10*3/uL (ref 4.0–10.5)

## 2017-11-21 LAB — MAGNESIUM: MAGNESIUM: 2.1 mg/dL (ref 1.7–2.4)

## 2017-11-21 LAB — LEGIONELLA PNEUMOPHILA SEROGP 1 UR AG: L. pneumophila Serogp 1 Ur Ag: NEGATIVE

## 2017-11-21 NOTE — Progress Notes (Signed)
PROGRESS NOTE   [name]  EHM:[id number]    DOB: [birth year]    DOA: 11/20/2017  PCP: Lucille Passy, MD   I have briefly reviewed patients previous medical records in [facility].  Brief Narrative:  77 year old male with PMH of HTN, HLD, prostate cancer status post prostatectomy, atrial fibrillation status post ablation and not on anticoagulants PTA, stage III chronic kidney disease (patient not aware PTA) presented with left-sided pleuritic chest pain ongoing for a few days but worsened night prior to admission, associated with difficulty taking deep breaths without cough.  In ED, noted to be in A. fib with RVR, not hypoxic.  Cardiology and pulmonology consulted.   Assessment & Plan:   Principal Problem:   Chest pain Active Problems:   Hyperlipidemia   Essential hypertension   Atrial fibrillation with RVR (HCC)   CKD (chronic kidney disease), stage III (HCC)   Left pleuritic chest pain/Left pleural effusion: CTA chest 4/4: No PE but showed medium-sized left pleural effusion with associated atelectasis.  Patient denies symptoms suggestive of pneumonia or URTI recently or sickly contacts.  However has low-grade fever.  Pulmonology consulted, recommending repeating chest x-ray in a.m. and if effusion increasing then for diagnostic and therapeutic tap.  Eliquis on hold if procedure warranted.  Pain is better.  Agree with cardiology that his chest pain does not seem cardiac in nature.  Paroxysmal A. fib with RVR: Cardiology consulted.  Continue verapamil for rate control.  Eliquis was started but now temporarily held for possible Arash centesis.  Echo shows normal LV function.  TSH normal.  Discussed with Dr. Stanford Breed.  Outpatient follow-up with Dr. Lovena Le in 4 weeks.  Essential hypertension: Mildly uncontrolled at times.  Continue verapamil and benazepril.  Stage III chronic kidney disease: Stable.  Patient was not aware of this diagnosis.  Counseled him and family extensively at  bedside.  Outpatient follow-up.  Hyperlipidemia: Allergic to statins.  Continue fenofibrate.     DVT prophylaxis: Eliquis which was started held in case he needs thoracentesis in a.m. Code Status: Full Family Communication: Discussed in detail with patient's spouse and daughter at bedside.  Updated care and answered questions. Disposition: DC home pending clinical improvement.   Consultants:  Cardiology Pulmonology  Procedures:  None  Antimicrobials:  None    Subjective: Left sided pleuritic chest pain is better. No SOB, cough.  ROS: As above.  Objective:  Vitals:   11/21/17 1643 11/21/17 1644 11/21/17 1645 11/21/17 1646  BP:    (!) 166/87  Pulse: 83 80 78 81  Resp: (!) 31 (!) 25 15 (!) 23  Temp:    99.6 F (37.6 C)  TempSrc:    Oral  SpO2: 96% 98% 98% 98%  Weight:      Height:        Examination:  General exam: Pleasant elderly male sitting up comfortably in bed. Respiratory system: Reduced BS's in left base. No rub/crackles. Rest clear . Respiratory effort normal. Cardiovascular system: S1 & S2 heard, RRR. No JVD, murmurs, rubs, gallops or clicks. No pedal edema. Tele: SR Gastrointestinal system: Abdomen is nondistended, soft and nontender. No organomegaly or masses felt. Normal bowel sounds heard. Central nervous system: Alert and oriented. No focal neurological deficits. Extremities: Symmetric 5 x 5 power. Skin: No rashes, lesions or ulcers Psychiatry: Judgement and insight appear normal. Mood & affect appropriate.     Data Reviewed: I have personally reviewed following labs and imaging studies  CBC: Recent Labs  Lab  11/20/17 0059 11/21/17 0300  WBC 8.2 8.0  NEUTROABS  --  4.7  HGB 15.8 14.1  HCT 46.2 42.3  MCV 91.5 91.4  PLT 233 350   Basic Metabolic Panel: Recent Labs  Lab 11/20/17 0059 11/20/17 0944 11/21/17 0300  NA 137  --  137  K 3.7  --  3.8  CL 106  --  104  CO2 22  --  22  GLUCOSE 165*  --  127*  BUN 17  --  16    CREATININE 1.29*  --  1.32*  CALCIUM 9.4  --  8.3*  MG  --  2.0 2.1   Liver Function Tests: Recent Labs  Lab 11/21/17 0300  AST 22  ALT 20  ALKPHOS 65  BILITOT 0.8  PROT 6.0*  ALBUMIN 2.7*   Coagulation Profile: No results for input(s): INR, PROTIME in the last 168 hours. Cardiac Enzymes: Recent Labs  Lab 11/20/17 0412 11/20/17 0944 11/20/17 1521  TROPONINI <0.03 <0.03 <0.03   HbA1C: Recent Labs    11/20/17 0412  HGBA1C 5.5   CBG: No results for input(s): GLUCAP in the last 168 hours.  No results found for this or any previous visit (from the past 240 hour(s)).       Radiology Studies: Dg Chest 2 View  Result Date: 11/21/2017 CLINICAL DATA:  Left pleural effusion EXAM: CHEST - 2 VIEW COMPARISON:  CT and x-ray earlier today FINDINGS: Small left pleural effusion slightly increased from earlier today. Mild left lower lobe atelectasis/infiltrate unchanged. Right lung clear.  Negative for heart failure. IMPRESSION: Small left pleural effusion and left lower lobe atelectasis/infiltrate. Mild interval increase in small left effusion. Electronically Signed   By: Franchot Gallo M.D.   On: 11/21/2017 13:10   Dg Chest 2 View  Result Date: 11/20/2017 CLINICAL DATA:  Chest pain EXAM: CHEST - 2 VIEW COMPARISON:  08/09/2011 FINDINGS: Small left-sided pleural effusion. Airspace disease at the left lung base. Normal heart size. No pneumothorax. IMPRESSION: Small left-sided pleural effusion with left basilar airspace disease suspicious for pneumonia. Radiographic follow-up to resolution recommended. Electronically Signed   By: Donavan Foil M.D.   On: 11/20/2017 01:25   Ct Angio Chest Pe W And/or Wo Contrast  Result Date: 11/20/2017 CLINICAL DATA:  Left chest pain history of prostate cancer. Shortness of breath. EXAM: CT ANGIOGRAPHY CHEST WITH CONTRAST TECHNIQUE: Multidetector CT imaging of the chest was performed using the standard protocol during bolus administration of intravenous  contrast. Multiplanar CT image reconstructions and MIPs were obtained to evaluate the vascular anatomy. CONTRAST:  172mL ISOVUE-370 IOPAMIDOL (ISOVUE-370) INJECTION 76% COMPARISON:  Chest radiograph 11/20/2017 FINDINGS: Cardiovascular: Contrast injection is sufficient to demonstrate satisfactory opacification of the pulmonary arteries to the segmental level. There is no pulmonary embolus. The main pulmonary artery is within normal limits for size. There is a normal 3-vessel arch branching pattern without evidence of acute aortic syndrome. There is noaortic atherosclerosis. Heart size is normal, without pericardial effusion. There are coronary artery calcifications. Mediastinum/Nodes: No mediastinal, hilar or axillary lymphadenopathy. The visualized thyroid and thoracic esophageal course are unremarkable. Lungs/Pleura: Medium-sized left pleural effusion. There is associated left basilar atelectasis. Upper Abdomen: Contrast bolus timing is not optimized for evaluation of the abdominal organs. Within this limitation, the visualized organs of the upper abdomen are normal. Musculoskeletal: Flowing midthoracic osteophytosis. Review of the MIP images confirms the above findings. IMPRESSION: 1. No pulmonary embolus. 2. Medium-sized left pleural effusion with associated atelectasis. Electronically Signed   By: Lennette Bihari  Collins [place] M.D.   On: 11/20/2017 05:30        Scheduled Meds: . benazepril  40 mg Oral Daily  . cholecalciferol  2,000 Units Oral Daily  . fenofibrate  160 mg Oral Daily  . multivitamin with minerals  1 tablet Oral Daily  . oxybutynin  10 mg Oral Daily  . verapamil  240 mg Oral QHS  . vitamin B-12  5,000 mcg Oral Daily  . vitamin C  1,000 mg Oral Daily   Continuous Infusions:   LOS: 0 days     Vernell Leep, MD, FACP, [facility]. [facility] Pager [phone]  If 7PM-7AM, please contact night-coverage [email or web address] Password [facility] 11/21/2017, 6:37 PM

## 2017-11-21 NOTE — Consult Note (Addendum)
Name: [name] MRN: [id number] DOB: 06/15/1941    ADMISSION DATE:  11/20/2017  CONSULTATION DATE:  11/21/2017   REFERRING MD :  Algis Liming, MD  CHIEF COMPLAINT:  Pleural effusion    HISTORY OF PRESENT ILLNESS: 77 year old man admitted 4/4 with sudden onset sharp left-sided pleuritic chest pain for 1 day, noted to have low-grade fever after hospital admission and developed a cough after admission. Admission labs were significant for WBC count of 8.2, negative troponins, atrial fibrillation with RVR, oxygen saturation of 97% on room air. Chest x-ray suggested left lower lobe infiltrate with questionable effusion. CT angiogram was negative for pulmonary embolism and confirmed small left effusion I have personally reviewed images and not impressed with underlying consolidation   Past medical history of CKD stage III, hypertension, atrial fibrillation status post ablation  He was seen by cardiology and started on apixaban 5 mg twice daily, verapamil given with rate control.  His chest pain resolved and is now as low as 1/10.  He had low-grade fever overnight  PAST MEDICAL HISTORY :   has a past medical history of Atrial flutter ([place]) (03-2012), Chronic back pain, Headache(784.0), Hearing loss, Hyperlipidemia, Hypertension, HYPOGONADISM, MALE (10/12/2007), Melanoma ([place]), and PROSTATE CANCER, HX OF (10/06/2008).  has a past surgical history that includes Melanoma excision (12/13/03); Colonoscopy w/ polypectomy; Tonsillectomy (1947); Rotator cuff repair (2008); arhtroscopy  right knee (2010); Lumbar laminectomy/decompression microdiscectomy (08/16/2011); Prostatectomy (07/2008); Back surgery (07/2006); Cardiac catheterization; Cardioversion (05/18/2012); Ablation of dysrhythmic focus (10/15/2012); Ablation (01-24-2014); Atrial flutter ablation (N/A, 10/15/2012); and Atrial flutter ablation (N/A, 01/24/2014). Prior to Admission medications   Medication Sig Start Date End Date Taking? Authorizing Provider    ALPRAZolam Duanne Moron) 0.5 MG tablet TAKE 1 TABLET AT BEDTIME AS NEEDED FOR ANXIETY 07/14/17  Yes Lucille Passy, MD  Ascorbic Acid (VITAMIN C) 500 MG CHEW Chew 1,000 mg by mouth daily.    Yes [provider]  aspirin EC 81 MG tablet Take 81 mg by mouth daily.   Yes [provider]  benazepril (LOTENSIN) 40 MG tablet TAKE 1 TABLET BY MOUTH  DAILY 11/17/17  Yes Lucille Passy, MD  Cholecalciferol (VITAMIN D3) 2000 UNITS TABS Take 2,000 Units by mouth daily.    Yes [provider]  Cyanocobalamin (VITAMIN B-12) 5000 MCG SUBL Place 5,000 mcg under the tongue daily.    Yes [provider]  fenofibrate 160 MG tablet TAKE 1 TABLET BY MOUTH  DAILY 11/17/17  Yes Lucille Passy, MD  Multiple Vitamin (MULTIVITAMIN WITH MINERALS) TABS Take 1 tablet by mouth daily.   Yes [provider]  oxybutynin (DITROPAN-XL) 10 MG 24 hr tablet Take 10 mg by mouth daily. 11/07/17  Yes [provider]  Turmeric 500 MG CAPS Take 1 capsule by mouth daily.   Yes [provider]  verapamil (CALAN-SR) 240 MG CR tablet TAKE 1 TABLET BY MOUTH AT  BEDTIME 04/17/17  Yes Lucille Passy, MD  pravastatin (PRAVACHOL) 20 MG tablet Take 1 tablet (20 mg total) by mouth daily. 10/10/11 11/12/11  Colon Branch, MD   Allergies  Allergen Reactions  . Eucalyptol     Head stops up  . Statins Other (See Comments)    Body aches, memory loss    FAMILY HISTORY:  family history includes Colon cancer in his brother; Pancreatic cancer in his mother. SOCIAL HISTORY:  reports that he quit smoking about 33 years ago. His smoking use included cigarettes. He has a 20.00 pack-year smoking history.  He has never used smokeless tobacco. He reports that he drinks about 1.2 oz of alcohol per week. He reports that he does not use drugs.  REVIEW OF SYSTEMS:   Positive for low-grade fever, chest pain  Constitutional: Negative for chills, weight loss and diaphoresis.  HENT: Negative for hearing loss, ear pain,  nosebleeds, congestion, sore throat, neck pain, tinnitus and ear discharge.   Eyes: Negative for blurred vision, double vision, photophobia, pain, discharge and redness.  Respiratory: Negative for  hemoptysis, sputum production, shortness of breath, wheezing and stridor.   Cardiovascular: Negative for palpitations, orthopnea, claudication, leg swelling and PND.  Gastrointestinal: Negative for heartburn, nausea, vomiting, abdominal pain, diarrhea, constipation, blood in stool and melena.  Genitourinary: Negative for dysuria, urgency, frequency, hematuria and flank pain.  Musculoskeletal: Negative for myalgias, back pain, joint pain and falls.  Skin: Negative for itching and rash.  Neurological: Negative for dizziness, tingling, tremors, sensory change, speech change, focal weakness, seizures, loss of consciousness, weakness and headaches.  Endo/Heme/Allergies: Negative for environmental allergies and polydipsia. Does not bruise/bleed easily.  SUBJECTIVE:   VITAL SIGNS: Temp:  [98.1 F (36.7 C)-100.7 F (38.2 C)] 98.1 F (36.7 C) (04/05 0916) Pulse Rate:  [67-86] 67 (04/05 0340) Resp:  [14-20] 14 (04/05 0916) BP: (126-153)/(65-78) 142/78 (04/05 0916) SpO2:  [97 %] 97 % (04/05 0916)  PHYSICAL EXAMINATION: Gen. Pleasant, well-nourished, in no distress, normal affect ENT - no lesions, no post nasal drip Neck: No JVD, no thyromegaly, no carotid bruits Lungs: no use of accessory muscles,  dullness to percussion and decreased breath sounds left base Cardiovascular: Rhythm regular, heart sounds  normal, no murmurs or gallops, no peripheral edema Abdomen: soft and non-tender, no hepatosplenomegaly, BS normal. Musculoskeletal: No deformities, no cyanosis or clubbing Neuro:  alert, non focal   Recent Labs  Lab 11/20/17 0059 11/21/17 0300  NA 137 137  K 3.7 3.8  CL 106 104  CO2 22 22  BUN 17 16  CREATININE 1.29* 1.32*  GLUCOSE 165* 127*   Recent Labs  Lab 11/20/17 0059  11/21/17 0300  HGB 15.8 14.1  HCT 46.2 42.3  WBC 8.2 8.0  PLT 233 217   Dg Chest 2 View  Result Date: 11/21/2017 CLINICAL DATA:  Left pleural effusion EXAM: CHEST - 2 VIEW COMPARISON:  CT and x-ray earlier today FINDINGS: Small left pleural effusion slightly increased from earlier today. Mild left lower lobe atelectasis/infiltrate unchanged. Right lung clear.  Negative for heart failure. IMPRESSION: Small left pleural effusion and left lower lobe atelectasis/infiltrate. Mild interval increase in small left effusion. Electronically Signed   By: Franchot Gallo M.D.   On: 11/21/2017 13:10   Dg Chest 2 View  Result Date: 11/20/2017 CLINICAL DATA:  Chest pain EXAM: CHEST - 2 VIEW COMPARISON:  08/09/2011 FINDINGS: Small left-sided pleural effusion. Airspace disease at the left lung base. Normal heart size. No pneumothorax. IMPRESSION: Small left-sided pleural effusion with left basilar airspace disease suspicious for pneumonia. Radiographic follow-up to resolution recommended. Electronically Signed   By: Donavan Foil M.D.   On: 11/20/2017 01:25   Ct Angio Chest Pe W And/or Wo Contrast  Result Date: 11/20/2017 CLINICAL DATA:  Left chest pain history of prostate cancer. Shortness of breath. EXAM: CT ANGIOGRAPHY CHEST WITH CONTRAST TECHNIQUE: Multidetector CT imaging of the chest was performed using the standard protocol during bolus administration of intravenous contrast. Multiplanar CT image reconstructions and MIPs were obtained to evaluate the vascular anatomy. CONTRAST:  128mL ISOVUE-370 IOPAMIDOL (ISOVUE-370) INJECTION 76%  COMPARISON:  Chest radiograph 11/20/2017 FINDINGS: Cardiovascular: Contrast injection is sufficient to demonstrate satisfactory opacification of the pulmonary arteries to the segmental level. There is no pulmonary embolus. The main pulmonary artery is within normal limits for size. There is a normal 3-vessel arch branching pattern without evidence of acute aortic syndrome. There is  noaortic atherosclerosis. Heart size is normal, without pericardial effusion. There are coronary artery calcifications. Mediastinum/Nodes: No mediastinal, hilar or axillary lymphadenopathy. The visualized thyroid and thoracic esophageal course are unremarkable. Lungs/Pleura: Medium-sized left pleural effusion. There is associated left basilar atelectasis. Upper Abdomen: Contrast bolus timing is not optimized for evaluation of the abdominal organs. Within this limitation, the visualized organs of the upper abdomen are normal. Musculoskeletal: Flowing midthoracic osteophytosis. Review of the MIP images confirms the above findings. IMPRESSION: 1. No pulmonary embolus. 2. Medium-sized left pleural effusion with associated atelectasis. Electronically Signed   By: Ulyses Jarred M.D.   On: 11/20/2017 05:30    ASSESSMENT / PLAN:  Left pleural effusion -with pleuritic chest pain and low-grade fever, seems to be related to pleurisy.  CT chest was reviewed and this does not show significant underlying consolidation, no evidence of malignancy.  Chest x-ray today shows slight increase in left effusion I offered him the wait and watch approach with repeat chest x-ray in 1 week Unfortunately he was started on apixaban and we will have to wait at least 24 hours prior to thoracentesis.  We will repeat chest x-ray in a.m. of 4/6 and if significant then can proceed with thoracentesis -would send fluid for cell count, LDH, protein and cytology besides cultures. Lymphocytic effusion would be consistent with pleurisy  Apixaban will be held in anticipation of procedure  P CCM available as needed  Kara Mead MD. FCCP. [facility] Pager [phone] If no response call [phone]     11/21/2017, 1:45 PM

## 2017-11-21 NOTE — Progress Notes (Addendum)
Progress Note  Patient Name: [name] Date of Encounter: 11/21/2017  Primary Cardiologist: Cristopher Peru, MD   Subjective   Complains of pleuritic CP but improved; no dyspnea or palpitations  Inpatient Medications    Scheduled Meds: . apixaban  5 mg Oral BID  . benazepril  40 mg Oral Daily  . cholecalciferol  2,000 Units Oral Daily  . fenofibrate  160 mg Oral Daily  . multivitamin with minerals  1 tablet Oral Daily  . oxybutynin  10 mg Oral Daily  . verapamil  240 mg Oral QHS  . vitamin B-12  5,000 mcg Oral Daily  . vitamin C  1,000 mg Oral Daily   Continuous Infusions:  PRN Meds: acetaminophen, ALPRAZolam, hydrALAZINE, morphine injection, nitroGLYCERIN, ondansetron (ZOFRAN) IV, zolpidem   Vital Signs    Vitals:   11/21/17 0448 11/21/17 0815 11/21/17 0817 11/21/17 0916  BP:   130/70 (!) 142/78  Pulse:      Resp:   17 14  Temp: 99.1 F (37.3 C) 99.6 F (37.6 C)  98.1 F (36.7 C)  TempSrc: Oral Oral  Oral  SpO2:    97%  Weight:      Height:        Intake/Output Summary (Last 24 hours) at 11/21/2017 1038 Last data filed at 11/21/2017 0730 Gross per 24 hour  Intake 1080 ml  Output 1351 ml  Net -271 ml   Filed Weights   11/20/17 0456  Weight: 205 lb 9.6 oz (93.3 kg)    Telemetry    Sinus - Personally Reviewed   Physical Exam   GEN: No acute distress.   Neck: No JVD Cardiac: RRR, no murmurs, rubs, or gallops.  Respiratory: Mildly diminished BS LLL GI: Soft, nontender, non-distended  MS: No edema Neuro:  Nonfocal  Psych: Normal affect   Labs    Chemistry Recent Labs  Lab 11/20/17 0059 11/21/17 0300  NA 137 137  K 3.7 3.8  CL 106 104  CO2 22 22  GLUCOSE 165* 127*  BUN 17 16  CREATININE 1.29* 1.32*  CALCIUM 9.4 8.3*  PROT  --  6.0*  ALBUMIN  --  2.7*  AST  --  22  ALT  --  20  ALKPHOS  --  65  BILITOT  --  0.8  GFRNONAA 52* 51*  GFRAA >60 59*  ANIONGAP 9 11     Hematology Recent Labs  Lab 11/20/17 0059 11/21/17 0300    WBC 8.2 8.0  RBC 5.05 4.63  HGB 15.8 14.1  HCT 46.2 42.3  MCV 91.5 91.4  MCH 31.3 30.5  MCHC 34.2 33.3  RDW 13.2 12.9  PLT 233 217    Cardiac Enzymes Recent Labs  Lab 11/20/17 0412 11/20/17 0944 11/20/17 1521  TROPONINI <0.03 <0.03 <0.03    Recent Labs  Lab 11/20/17 0103  TROPIPOC 0.02     BNP Recent Labs  Lab 11/20/17 0223  BNP 91.3     DDimer  Recent Labs  Lab 11/20/17 0217  DDIMER 0.55*     Radiology    Dg Chest 2 View  Result Date: 11/20/2017 CLINICAL DATA:  Chest pain EXAM: CHEST - 2 VIEW COMPARISON:  08/09/2011 FINDINGS: Small left-sided pleural effusion. Airspace disease at the left lung base. Normal heart size. No pneumothorax. IMPRESSION: Small left-sided pleural effusion with left basilar airspace disease suspicious for pneumonia. Radiographic follow-up to resolution recommended. Electronically Signed   By: Donavan Foil M.D.   On: 11/20/2017 01:25  Ct Angio Chest Pe W And/or Wo Contrast  Result Date: 11/20/2017 CLINICAL DATA:  Left chest pain history of prostate cancer. Shortness of breath. EXAM: CT ANGIOGRAPHY CHEST WITH CONTRAST TECHNIQUE: Multidetector CT imaging of the chest was performed using the standard protocol during bolus administration of intravenous contrast. Multiplanar CT image reconstructions and MIPs were obtained to evaluate the vascular anatomy. CONTRAST:  167mL ISOVUE-370 IOPAMIDOL (ISOVUE-370) INJECTION 76% COMPARISON:  Chest radiograph 11/20/2017 FINDINGS: Cardiovascular: Contrast injection is sufficient to demonstrate satisfactory opacification of the pulmonary arteries to the segmental level. There is no pulmonary embolus. The main pulmonary artery is within normal limits for size. There is a normal 3-vessel arch branching pattern without evidence of acute aortic syndrome. There is noaortic atherosclerosis. Heart size is normal, without pericardial effusion. There are coronary artery calcifications. Mediastinum/Nodes: No mediastinal,  hilar or axillary lymphadenopathy. The visualized thyroid and thoracic esophageal course are unremarkable. Lungs/Pleura: Medium-sized left pleural effusion. There is associated left basilar atelectasis. Upper Abdomen: Contrast bolus timing is not optimized for evaluation of the abdominal organs. Within this limitation, the visualized organs of the upper abdomen are normal. Musculoskeletal: Flowing midthoracic osteophytosis. Review of the MIP images confirms the above findings. IMPRESSION: 1. No pulmonary embolus. 2. Medium-sized left pleural effusion with associated atelectasis. Electronically Signed   By: Ulyses Jarred M.D.   On: 11/20/2017 05:30    Patient Profile     77 year old male with past medical history of atrial flutter status post prior ablation x2, hypertension, hyperlipidemia, prostate cancer, chronic stage III kidney disease for evaluation of chest pain and atrial fibrillation. Chest CT shows no pulmonary embolus but there is a left pleural effusion.  Creatinine is 1.29, enzymes negative.  Hemoglobin 15.8.      Assessment & Plan    1 paroxysmal atrial fibrillation-patient remains in sinus; echo shows normal LV function and TSH normal; continue verapamil for rate control if atrial fibrillation recurs. Continue apixaban 5 mg twice daily.    2 chest pain-Patient's chest pain is pleuritic. Enzymes negative.  No ST changes. LV function normal. Chest CT shows left pleural effusion and pt with low grade fever; likely related to pulmonary process.  3 left pleural effusion-etiology unclear.  Question pneumonia; further evaluation and therapy per primary service.  4 Hypertension-blood pressure is controlled.  Continue present medications.  5 Mild aortic stenosis-will need fu echos in the future.  We will have pt fu with Dr Lovena Le 4 weeks  For questions or updates, please contact [facility] Please consult [email or web address] for contact info under Cardiology/STEMI.      Signed, Kirk Ruths, MD  11/21/2017, 10:38 AM

## 2017-11-21 NOTE — Progress Notes (Signed)
Pt has had slight fever this evening. His temp at 2051 was 100.6 F and then 100.7 F at 0340. He was given 650 mg Tylenol and his temp dropped to 99.1 at 0448. Will continue to monitor.  Lupita Dawn, RN

## 2017-11-22 ENCOUNTER — Observation Stay (HOSPITAL_COMMUNITY): Payer: Medicare Other

## 2017-11-22 DIAGNOSIS — I4891 Unspecified atrial fibrillation: Secondary | ICD-10-CM | POA: Diagnosis not present

## 2017-11-22 DIAGNOSIS — R091 Pleurisy: Secondary | ICD-10-CM | POA: Diagnosis not present

## 2017-11-22 DIAGNOSIS — J90 Pleural effusion, not elsewhere classified: Secondary | ICD-10-CM | POA: Diagnosis not present

## 2017-11-22 DIAGNOSIS — I480 Paroxysmal atrial fibrillation: Secondary | ICD-10-CM | POA: Diagnosis not present

## 2017-11-22 DIAGNOSIS — N183 Chronic kidney disease, stage 3 (moderate): Secondary | ICD-10-CM | POA: Diagnosis not present

## 2017-11-22 DIAGNOSIS — E785 Hyperlipidemia, unspecified: Secondary | ICD-10-CM | POA: Diagnosis not present

## 2017-11-22 DIAGNOSIS — J9811 Atelectasis: Secondary | ICD-10-CM | POA: Diagnosis not present

## 2017-11-22 DIAGNOSIS — R0781 Pleurodynia: Secondary | ICD-10-CM | POA: Diagnosis not present

## 2017-11-22 DIAGNOSIS — I10 Essential (primary) hypertension: Secondary | ICD-10-CM | POA: Diagnosis not present

## 2017-11-22 LAB — BASIC METABOLIC PANEL
ANION GAP: 9 (ref 5–15)
BUN: 17 mg/dL (ref 6–20)
CALCIUM: 8.6 mg/dL — AB (ref 8.9–10.3)
CO2: 23 mmol/L (ref 22–32)
Chloride: 106 mmol/L (ref 101–111)
Creatinine, Ser: 1.48 mg/dL — ABNORMAL HIGH (ref 0.61–1.24)
GFR, EST AFRICAN AMERICAN: 51 mL/min — AB (ref >60)
GFR, EST NON AFRICAN AMERICAN: 44 mL/min — AB (ref >60)
Glucose, Bld: 131 mg/dL — ABNORMAL HIGH (ref 65–99)
Potassium: 3.6 mmol/L (ref 3.5–5.1)
SODIUM: 138 mmol/L (ref 135–145)

## 2017-11-22 LAB — GRAM STAIN

## 2017-11-22 LAB — GLUCOSE, PLEURAL OR PERITONEAL FLUID: Glucose, Fluid: 128 mg/dL

## 2017-11-22 LAB — BODY FLUID CELL COUNT WITH DIFFERENTIAL
EOS FL: 0 %
Lymphs, Fluid: 7 %
Monocyte-Macrophage-Serous Fluid: 12 % — ABNORMAL LOW (ref 50–90)
NEUTROPHIL FLUID: 81 % — AB (ref 0–25)
Total Nucleated Cell Count, Fluid: 7200 cu mm — ABNORMAL HIGH (ref 0–1000)

## 2017-11-22 LAB — PROTEIN, PLEURAL OR PERITONEAL FLUID: TOTAL PROTEIN, FLUID: 3.7 g/dL

## 2017-11-22 LAB — LACTATE DEHYDROGENASE: LDH: 181 U/L (ref 98–192)

## 2017-11-22 LAB — LACTATE DEHYDROGENASE, PLEURAL OR PERITONEAL FLUID: LD, Fluid: 593 U/L — ABNORMAL HIGH (ref 3–23)

## 2017-11-22 MED ORDER — LIDOCAINE HCL (PF) 1 % IJ SOLN
INTRAMUSCULAR | Status: AC
  Administered 2017-11-22: 13:00:00
  Filled 2017-11-22: qty 30

## 2017-11-22 MED ORDER — BENAZEPRIL HCL 40 MG PO TABS
40.0000 mg | ORAL_TABLET | Freq: Every day | ORAL | Status: DC
  Administered 2017-11-23 – 2017-11-24 (×2): 40 mg via ORAL
  Filled 2017-11-22 (×2): qty 1

## 2017-11-22 NOTE — Progress Notes (Signed)
PROGRESS NOTE   [name]  ZOX:[id number]    DOB: 03/05/1941    DOA: 11/20/2017  PCP: Lucille Passy, MD   I have briefly reviewed patients previous medical records in [facility].  Brief Narrative:  77 year old male with PMH of HTN, HLD, prostate cancer status post prostatectomy, atrial fibrillation status post ablation and not on anticoagulants PTA, stage III chronic kidney disease (patient not aware PTA) presented with left-sided pleuritic chest pain ongoing for a few days but worsened night prior to admission, associated with difficulty taking deep breaths without cough.  In ED, noted to be in A. fib with RVR, not hypoxic.  Cardiology and pulmonology consulted.  Status post US guided diagnostic and therapeutic left thoracentesis 4/6.   Assessment & Plan:   Principal Problem:   Chest pain Active Problems:   Hyperlipidemia   Essential hypertension   Atrial fibrillation with RVR (HCC)   CKD (chronic kidney disease), stage III (HCC)   Left pleuritic chest pain/Left pleural effusion: CTA chest 4/4: No PE but showed medium-sized left pleural effusion with associated atelectasis.  Patient denies symptoms suggestive of pneumonia or URTI recently or sickly contacts.  However had low-grade fever.  Pulmonology consulted, repeated chest x-ray and underwent ultrasound-guided diagnostic and therapeutic left thoracentesis by IR on 4/6 yielding 480 mL of bloody fluid.  Post-tap x-ray showed decreased effusion and no pneumothorax.  Fluid seems exudative in nature and Gram stain without organisms.  Follow culture.  Agree with cardiology that his chest pain does not seem cardiac in nature.  Couple of hours after tap, patient reported some chest tightness and stabbing pain after sneezing but lungs clear and normal saturations on room air as per RN.  EKG reviewed and no acute findings (73 bpm, normal axis, incomplete RBBB (not new), QTC 462, no acute changes).  Treat supportively with oxygen and Tylenol.   Monitor closely.  Follow pleural fluid culture results.  If has worsening chest pain, dyspnea or hypoxia then recommend repeating stat chest x-ray to look for pneumothorax.  Paroxysmal A. fib with RVR: Cardiology consulted.  Continue verapamil for rate control.  Eliquis was started but now temporarily held for thoracentesis.  Echo shows normal LV function.  TSH normal. Outpatient follow-up with Dr. Lovena Le in 4 weeks.  Given bloody pleural fluid, I discussed with Pulmonology on 4/6 and the recommended holding Eliquis today and resuming 4/7.  Essential hypertension: Mildly uncontrolled at times.  Continue verapamil.  Benazepril temporarily held due to rising creatinine.  Stage III chronic kidney disease: Stable.  Patient was not aware of this diagnosis.  Counseled him and family extensively at bedside.  Outpatient follow-up.  Patient's baseline creatinine probably fluctuates in the 1.2-1.6 range.  Presented with creatinine of 1.29 which has mildly increased to 1.48.  He did receive contrast for CTA chest.  Hold ACEI, encourage increased oral fluid intake and follow BMP in a.m.  Hyperlipidemia: Allergic to statins.  Continue fenofibrate.     DVT prophylaxis: Eliquis which was started held temporarily. Code Status: Full Family Communication: Discussed in detail with patient's spouse and daughter at bedside.  Updated care and answered questions. Disposition: DC home pending clinical improvement.   Consultants:  Cardiology Pulmonology IR  Procedures:  Ultrasound-guided diagnostic and therapeutic left thoracentesis by IR 4/6  Antimicrobials:  None    Subjective: Seen this morning after he returned from procedure.  Stated that his left-sided chest pain had resolved.  No dyspnea.  Subsequently paged by RN this afternoon  indicating some chest tightness and stabbing pain after sneezing.  ROS: As above.  Objective:  Vitals:   11/22/17 0920 11/22/17 1000 11/22/17 1324 11/22/17 1359  BP:  133/73 (!) 143/67 (!) 150/86 (!) 143/76  Pulse:   69 71  Resp:  (!) 24 18 16   Temp:  98.3 F (36.8 C) 99.4 F (37.4 C)   TempSrc:   Oral   SpO2:  95% 96% 96%  Weight:      Height:        Examination:  General exam: Pleasant elderly male sitting up comfortably in chair this morning. Respiratory system: Improved breath sounds in the left base. No rub/crackles. Rest clear . Respiratory effort normal. Cardiovascular system: S1 & S2 heard, RRR. No JVD, murmurs, rubs, gallops or clicks. No pedal edema.  Telemetry personally reviewed: Sinus rhythm. Gastrointestinal system: Abdomen is nondistended, soft and nontender. No organomegaly or masses felt. Normal bowel sounds heard.  Stable. Central nervous system: Alert and oriented. No focal neurological deficits.  Stable. Extremities: Symmetric 5 x 5 power. Skin: No rashes, lesions or ulcers Psychiatry: Judgement and insight appear normal. Mood & affect appropriate.     Data Reviewed: I have personally reviewed following labs and imaging studies  CBC: Recent Labs  Lab 11/20/17 0059 11/21/17 0300  WBC 8.2 8.0  NEUTROABS  --  4.7  HGB 15.8 14.1  HCT 46.2 42.3  MCV 91.5 91.4  PLT 233 762   Basic Metabolic Panel: Recent Labs  Lab 11/20/17 0059 11/20/17 0944 11/21/17 0300 11/22/17 0150  NA 137  --  137 138  K 3.7  --  3.8 3.6  CL 106  --  104 106  CO2 22  --  22 23  GLUCOSE 165*  --  127* 131*  BUN 17  --  16 17  CREATININE 1.29*  --  1.32* 1.48*  CALCIUM 9.4  --  8.3* 8.6*  MG  --  2.0 2.1  --    Liver Function Tests: Recent Labs  Lab 11/21/17 0300  AST 22  ALT 20  ALKPHOS 65  BILITOT 0.8  PROT 6.0*  ALBUMIN 2.7*   Coagulation Profile: No results for input(s): INR, PROTIME in the last 168 hours. Cardiac Enzymes: Recent Labs  Lab 11/20/17 0412 11/20/17 0944 11/20/17 1521  TROPONINI <0.03 <0.03 <0.03   HbA1C: Recent Labs    11/20/17 0412  HGBA1C 5.5   CBG: No results for input(s): GLUCAP in the last  168 hours.  Recent Results (from the past 240 hour(s))  Gram stain     Status: None   Collection Time: 11/22/17  9:42 AM  Result Value Ref Range Status   Specimen Description PLEURAL LEFT  Final   Special Requests NONE  Final   Gram Stain   Final    MODERATE WBC PRESENT,BOTH PMN AND MONONUCLEAR NO ORGANISMS SEEN Performed at [facility], [address], [place], [place] [address]    Report Status 11/22/2017 FINAL  Final         Radiology Studies: Dg Chest 1 View  Result Date: 11/22/2017 CLINICAL DATA:  Status post thoracentesis EXAM: CHEST  1 VIEW COMPARISON:  November 22, 2017 FINDINGS: No pneumothorax after left thoracentesis. A small left effusion remains, smaller in the interval. Opacity under the effusion is likely atelectasis. No other interval changes. IMPRESSION: Persistent small left pleural effusion, improved in the interval after thoracentesis. No pneumothorax. Electronically Signed   By: Dorise Bullion III M.D   On:  11/22/2017 09:43   Dg Chest 2 View  Result Date: 11/21/2017 CLINICAL DATA:  Left pleural effusion EXAM: CHEST - 2 VIEW COMPARISON:  CT and x-ray earlier today FINDINGS: Small left pleural effusion slightly increased from earlier today. Mild left lower lobe atelectasis/infiltrate unchanged. Right lung clear.  Negative for heart failure. IMPRESSION: Small left pleural effusion and left lower lobe atelectasis/infiltrate. Mild interval increase in small left effusion. Electronically Signed   By: Franchot Gallo M.D.   On: 11/21/2017 13:10   US Thoracentesis Asp Pleural Space W/img Guide  Result Date: 11/22/2017 INDICATION: Patient with prior history of prostate cancer, chronic kidney disease, left pleural effusion. Request received for diagnostic and therapeutic left thoracentesis. EXAM: ULTRASOUND GUIDED DIAGNOSTIC AND THERAPEUTIC LEFT THORACENTESIS MEDICATIONS: None COMPLICATIONS: None immediate. PROCEDURE: An ultrasound guided thoracentesis was thoroughly  discussed with the patient and questions answered. The benefits, risks, alternatives and complications were also discussed. The patient understands and wishes to proceed with the procedure. Written consent was obtained. Ultrasound was performed to localize and mark an adequate pocket of fluid in the left chest. The area was then prepped and draped in the normal sterile fashion. 1% Lidocaine was used for local anesthesia. Under ultrasound guidance a 6 Fr Safe-T-Centesis catheter was introduced. Thoracentesis was performed. The catheter was removed and a dressing applied. FINDINGS: A total of approximately 480 cc of bloody fluid was removed. Samples were sent to the laboratory as requested by the clinical team. IMPRESSION: Successful ultrasound guided diagnostic and therapeutic left thoracentesis yielding 480 cc of pleural fluid. Read by: Rowe Robert, PA-C Electronically Signed   By: Jacqulynn Cadet M.D.   On: 11/22/2017 09:36        Scheduled Meds: . [START ON 11/23/2017] benazepril  40 mg Oral Daily  . cholecalciferol  2,000 Units Oral Daily  . fenofibrate  160 mg Oral Daily  . multivitamin with minerals  1 tablet Oral Daily  . oxybutynin  10 mg Oral Daily  . verapamil  240 mg Oral QHS  . vitamin B-12  5,000 mcg Oral Daily  . vitamin C  1,000 mg Oral Daily   Continuous Infusions:   LOS: 0 days     Vernell Leep, MD, FACP, [facility]. [facility] Pager [phone]  If 7PM-7AM, please contact night-coverage [email or web address] Password [email or web address] 11/22/2017, 2:53 PM

## 2017-11-22 NOTE — Procedures (Signed)
Ultrasound-guided diagnostic and therapeutic left thoracentesis performed yielding 480 cc of bloody fluid. No immediate complications. Follow-up chest x-ray pending. The fluid was sent to the lab for preordered studies.

## 2017-11-22 NOTE — Plan of Care (Signed)
Care plan reviewed and patient is progressing.  

## 2017-11-23 ENCOUNTER — Observation Stay (HOSPITAL_COMMUNITY): Payer: Medicare Other

## 2017-11-23 DIAGNOSIS — I10 Essential (primary) hypertension: Secondary | ICD-10-CM | POA: Diagnosis not present

## 2017-11-23 DIAGNOSIS — J90 Pleural effusion, not elsewhere classified: Secondary | ICD-10-CM | POA: Diagnosis not present

## 2017-11-23 DIAGNOSIS — N183 Chronic kidney disease, stage 3 (moderate): Secondary | ICD-10-CM | POA: Diagnosis not present

## 2017-11-23 DIAGNOSIS — Z9889 Other specified postprocedural states: Secondary | ICD-10-CM | POA: Diagnosis not present

## 2017-11-23 DIAGNOSIS — I4891 Unspecified atrial fibrillation: Secondary | ICD-10-CM | POA: Diagnosis not present

## 2017-11-23 LAB — BASIC METABOLIC PANEL
Anion gap: 10 (ref 5–15)
BUN: 16 mg/dL (ref 6–20)
CHLORIDE: 104 mmol/L (ref 101–111)
CO2: 23 mmol/L (ref 22–32)
CREATININE: 1.31 mg/dL — AB (ref 0.61–1.24)
Calcium: 8.7 mg/dL — ABNORMAL LOW (ref 8.9–10.3)
GFR, EST AFRICAN AMERICAN: 59 mL/min — AB (ref >60)
GFR, EST NON AFRICAN AMERICAN: 51 mL/min — AB (ref >60)
Glucose, Bld: 128 mg/dL — ABNORMAL HIGH (ref 65–99)
POTASSIUM: 3.9 mmol/L (ref 3.5–5.1)
SODIUM: 137 mmol/L (ref 135–145)

## 2017-11-23 LAB — CBC
HCT: 41.9 % (ref 39.0–52.0)
HEMOGLOBIN: 14.2 g/dL (ref 13.0–17.0)
MCH: 30.7 pg (ref 26.0–34.0)
MCHC: 33.9 g/dL (ref 30.0–36.0)
MCV: 90.5 fL (ref 78.0–100.0)
PLATELETS: 253 10*3/uL (ref 150–400)
RBC: 4.63 MIL/uL (ref 4.22–5.81)
RDW: 12.7 % (ref 11.5–15.5)
WBC: 8.9 10*3/uL (ref 4.0–10.5)

## 2017-11-23 MED ORDER — APIXABAN 5 MG PO TABS
5.0000 mg | ORAL_TABLET | Freq: Two times a day (BID) | ORAL | Status: DC
  Administered 2017-11-23 – 2017-11-24 (×3): 5 mg via ORAL
  Filled 2017-11-23 (×3): qty 1

## 2017-11-23 NOTE — Progress Notes (Signed)
PROGRESS NOTE   JAKAYDEN CANCIO  EPP:[id number]    DOB: [birth year]    DOA: 11/20/2017  PCP: Lucille Passy, MD   I have briefly reviewed patients previous medical records in [facility].  Brief Narrative:  77 year old male with PMH of HTN, HLD, prostate cancer status post prostatectomy, atrial fibrillation status post ablation and not on anticoagulants PTA, stage III chronic kidney disease (patient not aware PTA) presented with left-sided pleuritic chest pain ongoing for a few days but worsened night prior to admission, associated with difficulty taking deep breaths without cough.  In ED, noted to be in A. fib with RVR, not hypoxic.  Cardiology and pulmonology consulted.  Status post US guided diagnostic and therapeutic left thoracentesis 4/6, cultures pending.  Improved.   Assessment & Plan:   Principal Problem:   Chest pain Active Problems:   Hyperlipidemia   Essential hypertension   Atrial fibrillation with RVR (HCC)   CKD (chronic kidney disease), stage III (HCC)   Left pleuritic chest pain/Left pleural effusion: CTA chest 4/4: No PE but showed medium-sized left pleural effusion with associated atelectasis.  Patient denies symptoms suggestive of pneumonia or URTI recently or sickly contacts.  However had low-grade fever.  Pulmonology consulted, repeated chest x-ray and underwent ultrasound-guided diagnostic and therapeutic left thoracentesis by IR on 4/6 yielding 480 mL of bloody fluid.  Post-tap x-ray showed decreased effusion and no pneumothorax.  Fluid seems exudative in nature and Gram stain without organisms.  Follow culture-pending.  Ongoing intermittent low-grade fevers otherwise symptomatically and radiologically better-personally reviewed chest x-ray and.  Follow pleural fluid cultures and cytology and treat supportively.  Improving.  Add incentive spirometry.  Paroxysmal A. fib with RVR: Cardiology consulted.  Continue verapamil for rate control. Echo shows normal LV function.   TSH normal. Outpatient follow-up with Dr. Lovena Le in 4 weeks.  Eliquis was temporarily held for thoracentesis and was resumed 4/7.  Essential hypertension: Mildly uncontrolled at times.  Continue verapamil.  Benazepril which was temporarily held on 4/6 due to elevated creatinine resumed again 4/7.  Stage III chronic kidney disease: Patient was not aware of this diagnosis and now is aware.  Outpatient follow-up.  Patient's baseline creatinine probably fluctuates in the 1.2-1.6 range.  Presented with creatinine of 1.29 which had mildly increased to 1.48 on 4/6.  He did receive contrast for CTA chest.  Temporarily held benazepril on 4/6, encourage oral fluids, creatinine down to 1.3.  Resume benazepril.  Follow BMP closely as outpatient.  Hyperlipidemia: Allergic to statins.  Continue fenofibrate.     DVT prophylaxis: Eliquis Code Status: Full Family Communication: None at bedside.   Disposition: DC home pending clinical improvement and outstanding pleural fluid results, possibly 4/8.Marland Kitchen    Consultants:  Cardiology Pulmonology IR  Procedures:  Ultrasound-guided diagnostic and therapeutic left thoracentesis by IR 4/6  Antimicrobials:  None    Subjective: Overall feels much better.  Intermittent minimal, 1/10 left upper pleuritic chest pain with deep breathing or coughing.  No dyspnea or chest tightness.  No bleeding reported.  Patient was interviewed and examined along with RN.  ROS: As above.  Objective:  Vitals:   11/22/17 2010 11/22/17 2121 11/23/17 0429 11/23/17 0639  BP: (!) 154/81 (!) 165/90 (!) 148/84   Pulse: 81     Resp: 20     Temp: 99 F (37.2 C)  (!) 100.6 F (38.1 C) 98.8 F (37.1 C)  TempSrc: Oral  Oral Oral  SpO2: 98%     Weight:  Height:        Examination:  General exam: Pleasant elderly male seen ambulating comfortably in the room. Respiratory system: Slightly diminished breath sounds in the left base but otherwise clear to auscultation.  No  increased work of breathing. Cardiovascular system: S1 & S2 heard, RRR. No JVD, murmurs, rubs, gallops or clicks. No pedal edema.  Telemetry personally reviewed: Sinus rhythm. Gastrointestinal system: Abdomen is nondistended, soft and nontender. No organomegaly or masses felt. Normal bowel sounds heard.  Stable. Central nervous system: Alert and oriented. No focal neurological deficits.  Stable. Extremities: Symmetric 5 x 5 power. Skin: No rashes, lesions or ulcers Psychiatry: Judgement and insight appear normal. Mood & affect appropriate.     Data Reviewed: I have personally reviewed following labs and imaging studies  CBC: Recent Labs  Lab 11/20/17 0059 11/21/17 0300 11/23/17 0247  WBC 8.2 8.0 8.9  NEUTROABS  --  4.7  --   HGB 15.8 14.1 14.2  HCT 46.2 42.3 41.9  MCV 91.5 91.4 90.5  PLT 233 217 350   Basic Metabolic Panel: Recent Labs  Lab 11/20/17 0059 11/20/17 0944 11/21/17 0300 11/22/17 0150 11/23/17 0247  NA 137  --  137 138 137  K 3.7  --  3.8 3.6 3.9  CL 106  --  104 106 104  CO2 22  --  22 23 23   GLUCOSE 165*  --  127* 131* 128*  BUN 17  --  16 17 16   CREATININE 1.29*  --  1.32* 1.48* 1.31*  CALCIUM 9.4  --  8.3* 8.6* 8.7*  MG  --  2.0 2.1  --   --    Liver Function Tests: Recent Labs  Lab 11/21/17 0300  AST 22  ALT 20  ALKPHOS 65  BILITOT 0.8  PROT 6.0*  ALBUMIN 2.7*   Coagulation Profile: No results for input(s): INR, PROTIME in the last 168 hours. Cardiac Enzymes: Recent Labs  Lab 11/20/17 0412 11/20/17 0944 11/20/17 1521  TROPONINI <0.03 <0.03 <0.03   HbA1C: No results for input(s): HGBA1C in the last 72 hours. CBG: No results for input(s): GLUCAP in the last 168 hours.  Recent Results (from the past 240 hour(s))  Gram stain     Status: None   Collection Time: 11/22/17  9:42 AM  Result Value Ref Range Status   Specimen Description PLEURAL LEFT  Final   Special Requests NONE  Final   Gram Stain   Final    MODERATE WBC PRESENT,BOTH  PMN AND MONONUCLEAR NO ORGANISMS SEEN Performed at [facility], [address] [place], [place] [address]    Report Status 11/22/2017 FINAL  Final         Radiology Studies: Dg Chest 1 View  Result Date: 11/22/2017 CLINICAL DATA:  Status post thoracentesis EXAM: CHEST  1 VIEW COMPARISON:  November 22, 2017 FINDINGS: No pneumothorax after left thoracentesis. A small left effusion remains, smaller in the interval. Opacity under the effusion is likely atelectasis. No other interval changes. IMPRESSION: Persistent small left pleural effusion, improved in the interval after thoracentesis. No pneumothorax. Electronically Signed   By: Dorise Bullion III M.D   On: 11/22/2017 09:43   Dg Chest 2 View  Result Date: 11/23/2017 CLINICAL DATA:  Followup left pleural effusion. EXAM: CHEST - 2 VIEW COMPARISON:  11/21/2017. FINDINGS: Small to moderate-sized left pleural effusion is stable from the previous day's exam. There is mild associated lung base atelectasis. Remainder of the lungs is clear. No right  pleural effusion. No pneumothorax. Heart, mediastinum and hila are unremarkable. Skeletal structures are intact. IMPRESSION: 1. Small to moderate-sized left pleural effusion without change previous day's study. Mild associated left lung base atelectasis. No convincing pneumonia or pulmonary edema. Electronically Signed   By: Lajean Manes M.D.   On: 11/23/2017 10:49   Dg Chest 2 View  Result Date: 11/23/2017 CLINICAL DATA:  Encounter for pleural effusion. EXAM: CHEST - 2 VIEW COMPARISON:  11/22/2017 at 9:32 a.m. the FINDINGS: Small left pleural effusion obscures the left hemidiaphragm. Is mild associated left lung base opacity consistent with atelectasis. Remainder of the lungs is clear. No right pleural effusion. No pneumothorax. Cardiac silhouette is normal in size and configuration. No mediastinal hilar masses or convincing adenopathy. Skeletal structures are intact. IMPRESSION: 1. Small left pleural  effusion mild associated left lung base atelectasis. No evidence of pneumonia or pulmonary edema. Electronically Signed   By: Lajean Manes M.D.   On: 11/23/2017 10:48   Dg Chest 2 View  Result Date: 11/21/2017 CLINICAL DATA:  Left pleural effusion EXAM: CHEST - 2 VIEW COMPARISON:  CT and x-ray earlier today FINDINGS: Small left pleural effusion slightly increased from earlier today. Mild left lower lobe atelectasis/infiltrate unchanged. Right lung clear.  Negative for heart failure. IMPRESSION: Small left pleural effusion and left lower lobe atelectasis/infiltrate. Mild interval increase in small left effusion. Electronically Signed   By: Franchot Gallo M.D.   On: 11/21/2017 13:10   US Thoracentesis Asp Pleural Space W/img Guide  Result Date: 11/22/2017 INDICATION: Patient with prior history of prostate cancer, chronic kidney disease, left pleural effusion. Request received for diagnostic and therapeutic left thoracentesis. EXAM: ULTRASOUND GUIDED DIAGNOSTIC AND THERAPEUTIC LEFT THORACENTESIS MEDICATIONS: None COMPLICATIONS: None immediate. PROCEDURE: An ultrasound guided thoracentesis was thoroughly discussed with the patient and questions answered. The benefits, risks, alternatives and complications were also discussed. The patient understands and wishes to proceed with the procedure. Written consent was obtained. Ultrasound was performed to localize and mark an adequate pocket of fluid in the left chest. The area was then prepped and draped in the normal sterile fashion. 1% Lidocaine was used for local anesthesia. Under ultrasound guidance a 6 Fr Safe-T-Centesis catheter was introduced. Thoracentesis was performed. The catheter was removed and a dressing applied. FINDINGS: A total of approximately 480 cc of bloody fluid was removed. Samples were sent to the laboratory as requested by the clinical team. IMPRESSION: Successful ultrasound guided diagnostic and therapeutic left thoracentesis yielding 480 cc of  pleural fluid. Read by: Rowe Robert, PA-C Electronically Signed   By: Jacqulynn Cadet M.D.   On: 11/22/2017 09:36        Scheduled Meds: . benazepril  40 mg Oral Daily  . cholecalciferol  2,000 Units Oral Daily  . fenofibrate  160 mg Oral Daily  . multivitamin with minerals  1 tablet Oral Daily  . oxybutynin  10 mg Oral Daily  . verapamil  240 mg Oral QHS  . vitamin B-12  5,000 mcg Oral Daily  . vitamin C  1,000 mg Oral Daily   Continuous Infusions:   LOS: 0 days     Vernell Leep, MD, FACP, [facility]. [facility] Pager [phone]  If 7PM-7AM, please contact night-coverage [email or web address] Password [facility] 11/23/2017, 12:37 PM

## 2017-11-23 NOTE — Progress Notes (Signed)
[place] for apixaban Indication: atrial fibrillation  Allergies  Allergen Reactions  . Eucalyptol     Head stops up  . Statins Other (See Comments)    Body aches, memory loss    Patient Measurements: Height: 5\' 11"  (180.3 cm) Weight: 205 lb 9.6 oz (93.3 kg) IBW/kg (Calculated) : 75.3  Vital Signs: Temp: 98.8 F (37.1 C) (04/07 0639) Temp Source: Oral (04/07 0639) BP: 148/84 (04/07 0429)  Labs: Recent Labs    11/20/17 1521 11/21/17 0300 11/22/17 0150 11/23/17 0247  HGB  --  14.1  --  14.2  HCT  --  42.3  --  41.9  PLT  --  217  --  253  CREATININE  --  1.32* 1.48* 1.31*  TROPONINI <0.03  --   --   --     Estimated Creatinine Clearance: 56 mL/min (A) (by C-G formula based on SCr of 1.31 mg/dL (H)).   Medical History: Past Medical History:  Diagnosis Date  . Atrial flutter ([place]) 03-2012  . Chronic back pain   . Headache(784.0)    migraines x 3 in life  . Hearing loss   . Hyperlipidemia   . Hypertension   . HYPOGONADISM, MALE 10/12/2007   Followup per urology    . Melanoma ([place])    melanoma left forearm 2005- no chemo  . PROSTATE CANCER, HX OF 10/06/2008   Status post surgery approximately 2010      Medications:  Medications Prior to Admission  Medication Sig Dispense Refill Last Dose  . ALPRAZolam (XANAX) 0.5 MG tablet TAKE 1 TABLET AT BEDTIME AS NEEDED FOR ANXIETY 30 tablet 3 Past Week at Unknown time  . Ascorbic Acid (VITAMIN C) 500 MG CHEW Chew 1,000 mg by mouth daily.    11/19/2017 at Unknown time  . aspirin EC 81 MG tablet Take 81 mg by mouth daily.   11/19/2017 at Unknown time  . benazepril (LOTENSIN) 40 MG tablet TAKE 1 TABLET BY MOUTH  DAILY 90 tablet 1 11/19/2017 at Unknown time  . Cholecalciferol (VITAMIN D3) 2000 UNITS TABS Take 2,000 Units by mouth daily.    11/19/2017 at Unknown time  . Cyanocobalamin (VITAMIN B-12) 5000 MCG SUBL Place 5,000 mcg under the tongue daily.    11/19/2017 at Unknown time  . fenofibrate  160 MG tablet TAKE 1 TABLET BY MOUTH  DAILY 90 tablet 1 11/19/2017 at Unknown time  . Multiple Vitamin (MULTIVITAMIN WITH MINERALS) TABS Take 1 tablet by mouth daily.   11/19/2017 at Unknown time  . oxybutynin (DITROPAN-XL) 10 MG 24 hr tablet Take 10 mg by mouth daily.  11 11/19/2017 at Unknown time  . Turmeric 500 MG CAPS Take 1 capsule by mouth daily.   11/19/2017 at Unknown time  . verapamil (CALAN-SR) 240 MG CR tablet TAKE 1 TABLET BY MOUTH AT  BEDTIME 90 tablet 3 11/19/2017 at Unknown time   Scheduled:  . benazepril  40 mg Oral Daily  . cholecalciferol  2,000 Units Oral Daily  . fenofibrate  160 mg Oral Daily  . multivitamin with minerals  1 tablet Oral Daily  . oxybutynin  10 mg Oral Daily  . verapamil  240 mg Oral QHS  . vitamin B-12  5,000 mcg Oral Daily  . vitamin C  1,000 mg Oral Daily    Assessment: 77 yo male with history of aflutter (CHADSVASC=3). He is now s/p thoracentesis on 4/6. Pharmacy to resume apixaban. -SCr= 1.31, CBC stable  Goal of Therapy:  Monitor platelets by anticoagulation protocol: Yes   Plan:  -apixaban 5mg  po bid -No medication adjustments anticipated  Hildred Laser, PharmD Clinical Pharmacist Clinical phone from 8:30-4:00 is [phone] After 4pm, please call Main Rx [phone]) for assistance. 11/23/2017 12:41 PM

## 2017-11-23 NOTE — Plan of Care (Signed)
Care plan reviewed, patient is progressing.  

## 2017-11-24 DIAGNOSIS — I10 Essential (primary) hypertension: Secondary | ICD-10-CM | POA: Diagnosis not present

## 2017-11-24 DIAGNOSIS — N183 Chronic kidney disease, stage 3 (moderate): Secondary | ICD-10-CM | POA: Diagnosis not present

## 2017-11-24 DIAGNOSIS — I4891 Unspecified atrial fibrillation: Secondary | ICD-10-CM | POA: Diagnosis not present

## 2017-11-24 DIAGNOSIS — R0781 Pleurodynia: Secondary | ICD-10-CM | POA: Diagnosis not present

## 2017-11-24 DIAGNOSIS — J90 Pleural effusion, not elsewhere classified: Secondary | ICD-10-CM | POA: Diagnosis not present

## 2017-11-24 MED ORDER — APIXABAN 5 MG PO TABS: 5.0000 mg | ORAL_TABLET | Freq: Two times a day (BID) | ORAL | 0 refills | Status: DC

## 2017-11-24 MED ORDER — LEVOFLOXACIN 750 MG PO TABS
750.0000 mg | ORAL_TABLET | Freq: Every day | ORAL | Status: DC
  Administered 2017-11-24: 750 mg via ORAL
  Filled 2017-11-24: qty 1

## 2017-11-24 MED ORDER — LEVOFLOXACIN 750 MG PO TABS: 750.0000 mg | ORAL_TABLET | Freq: Every day | ORAL | 0 refills | Status: DC

## 2017-11-24 NOTE — Progress Notes (Signed)
Patient in room with spouse. Peripheral IV site removed from left forearm with catheter tip intact. Discharge instructions, medications with changes, symptoms to notify MD, and follow up appointments reviewed with patient and spouse. Patient understood all of the above. Personal belongings at bedside. Patient is no acute distress.

## 2017-11-24 NOTE — Progress Notes (Signed)
Progress Note  Patient Name: [name] Date of Encounter: 11/24/2017  Primary Cardiologist: Cristopher Peru, MD   Subjective   Pleuritic pain improved no palpitations   Inpatient Medications    Scheduled Meds: . apixaban  5 mg Oral BID  . benazepril  40 mg Oral Daily  . cholecalciferol  2,000 Units Oral Daily  . fenofibrate  160 mg Oral Daily  . multivitamin with minerals  1 tablet Oral Daily  . oxybutynin  10 mg Oral Daily  . verapamil  240 mg Oral QHS  . vitamin B-12  5,000 mcg Oral Daily  . vitamin C  1,000 mg Oral Daily   Continuous Infusions:  PRN Meds: acetaminophen, ALPRAZolam, hydrALAZINE, morphine injection, nitroGLYCERIN, ondansetron (ZOFRAN) IV, zolpidem   Vital Signs    Vitals:   11/23/17 1301 11/23/17 1953 11/23/17 2134 11/24/17 0500  BP: (!) 152/84 (!) 154/85 (!) 156/84 131/69  Pulse: 73   60  Resp: 18     Temp: 98.3 F (36.8 C) 99.7 F (37.6 C)  99.7 F (37.6 C)  TempSrc: Oral Oral  Oral  SpO2: 96%   97%  Weight:      Height:        Intake/Output Summary (Last 24 hours) at 11/24/2017 0839 Last data filed at 11/23/2017 1300 Gross per 24 hour  Intake 240 ml  Output -  Net 240 ml   Filed Weights   11/20/17 0456  Weight: 205 lb 9.6 oz (93.3 kg)    Telemetry    NSR 11/24/2017    Physical Exam   Affect appropriate Healthy:  appears stated age HEENT: normal Neck supple with no adenopathy JVP normal no bruits no thyromegaly Lungs clear with atelectasis left base 1/S2 no murmur, no rub, gallop or click PMI normal Abdomen: benighn, BS positve, no tenderness, no AAA no bruit.  No HSM or HJR Distal pulses intact with no bruits No edema Neuro non-focal Skin warm and dry No muscular weakness   Labs    Chemistry Recent Labs  Lab 11/21/17 0300 11/22/17 0150 11/23/17 0247  NA 137 138 137  K 3.8 3.6 3.9  CL 104 106 104  CO2 22 23 23   GLUCOSE 127* 131* 128*  BUN 16 17 16   CREATININE 1.32* 1.48* 1.31*  CALCIUM 8.3* 8.6* 8.7*    PROT 6.0*  --   --   ALBUMIN 2.7*  --   --   AST 22  --   --   ALT 20  --   --   ALKPHOS 65  --   --   BILITOT 0.8  --   --   GFRNONAA 51* 44* 51*  GFRAA 59* 51* 59*  ANIONGAP 11 9 10      Hematology Recent Labs  Lab 11/20/17 0059 11/21/17 0300 11/23/17 0247  WBC 8.2 8.0 8.9  RBC 5.05 4.63 4.63  HGB 15.8 14.1 14.2  HCT 46.2 42.3 41.9  MCV 91.5 91.4 90.5  MCH 31.3 30.5 30.7  MCHC 34.2 33.3 33.9  RDW 13.2 12.9 12.7  PLT 233 217 253    Cardiac Enzymes Recent Labs  Lab 11/20/17 0412 11/20/17 0944 11/20/17 1521  TROPONINI <0.03 <0.03 <0.03    Recent Labs  Lab 11/20/17 0103  TROPIPOC 0.02     BNP Recent Labs  Lab 11/20/17 0223  BNP 91.3     DDimer  Recent Labs  Lab 11/20/17 0217  DDIMER 0.55*     Radiology    Dg Chest 1  View  Result Date: 11/22/2017 CLINICAL DATA:  Status post thoracentesis EXAM: CHEST  1 VIEW COMPARISON:  November 22, 2017 FINDINGS: No pneumothorax after left thoracentesis. A small left effusion remains, smaller in the interval. Opacity under the effusion is likely atelectasis. No other interval changes. IMPRESSION: Persistent small left pleural effusion, improved in the interval after thoracentesis. No pneumothorax. Electronically Signed   By: Dorise Bullion III M.D   On: 11/22/2017 09:43   Dg Chest 2 View  Result Date: 11/23/2017 CLINICAL DATA:  Encounter for pleural effusion. EXAM: CHEST - 2 VIEW COMPARISON:  11/22/2017 at 9:32 a.m. the FINDINGS: Small left pleural effusion obscures the left hemidiaphragm. Is mild associated left lung base opacity consistent with atelectasis. Remainder of the lungs is clear. No right pleural effusion. No pneumothorax. Cardiac silhouette is normal in size and configuration. No mediastinal hilar masses or convincing adenopathy. Skeletal structures are intact. IMPRESSION: 1. Small left pleural effusion mild associated left lung base atelectasis. No evidence of pneumonia or pulmonary edema. Electronically Signed    By: Lajean Manes M.D.   On: 11/23/2017 10:48   US Thoracentesis Asp Pleural Space W/img Guide  Result Date: 11/22/2017 INDICATION: Patient with prior history of prostate cancer, chronic kidney disease, left pleural effusion. Request received for diagnostic and therapeutic left thoracentesis. EXAM: ULTRASOUND GUIDED DIAGNOSTIC AND THERAPEUTIC LEFT THORACENTESIS MEDICATIONS: None COMPLICATIONS: None immediate. PROCEDURE: An ultrasound guided thoracentesis was thoroughly discussed with the patient and questions answered. The benefits, risks, alternatives and complications were also discussed. The patient understands and wishes to proceed with the procedure. Written consent was obtained. Ultrasound was performed to localize and mark an adequate pocket of fluid in the left chest. The area was then prepped and draped in the normal sterile fashion. 1% Lidocaine was used for local anesthesia. Under ultrasound guidance a 6 Fr Safe-T-Centesis catheter was introduced. Thoracentesis was performed. The catheter was removed and a dressing applied. FINDINGS: A total of approximately 480 cc of bloody fluid was removed. Samples were sent to the laboratory as requested by the clinical team. IMPRESSION: Successful ultrasound guided diagnostic and therapeutic left thoracentesis yielding 480 cc of pleural fluid. Read by: Rowe Robert, PA-C Electronically Signed   By: Jacqulynn Cadet M.D.   On: 11/22/2017 09:36    Patient Profile     77 year old male with past medical history of atrial flutter status post prior ablation x2, hypertension, hyperlipidemia, prostate cancer, chronic stage III kidney disease for evaluation of chest pain and atrial fibrillation. Chest CT shows no pulmonary embolus but there is a left pleural effusion.  Creatinine is 1.29, enzymes negative.  Hemoglobin 15.8.      Assessment & Plan    1 paroxysmal atrial fibrillation-maintaining NSR continue eliquis   2 chest pain-pleuritic related to  pulmonary process R/O echo with no RWMA;s no plans for stress testing  3 Pleural effusion: post US guided thoracentesis 480 cc CXR 11/23/17 improved no pneumonia   4 HTN:  Well controlled.  Continue current medications and low sodium Dash type diet.    5 Mild aortic stenosis-reviewed echo 11/20/17 mean gradient on ly 14 mmHg f/u echo in a year   Arranged outpatient f/u with Dr Lovena Le Will sign off   For questions or updates, please contact [place] Please consult [email or web address] for contact info under Cardiology/STEMI.      Signed, Jenkins Rouge, MD  11/24/2017, 8:39 AM

## 2017-11-24 NOTE — Discharge Summary (Signed)
Physician Discharge Summary  [name] [id number]:[id number] DOB: June 25, 1941  PCP: Lucille Passy, MD  Admit date: 11/20/2017 Discharge date: 11/24/2017  Recommendations for Outpatient Follow-up:  1. Dr. Arnette Norris, PCP in 4 days with repeat labs (CBC & BMP). 2. Tammy Parrett, NP/Pulmonology on 11/28/17 at 4 PM.  Please follow final pleural fluid culture and other results and consider repeating chest x-ray when deemed necessary. 3. Dr. Crissie Sickles, EP cardiology on 12/12/17 at 4 PM.  Home Health: None Equipment/Devices: None  Discharge Condition: Improved and stable. CODE STATUS: Full. Diet recommendation: Heart healthy diet.  Discharge Diagnoses:  Principal Problem:   Chest pain Active Problems:   Hyperlipidemia   Essential hypertension   Atrial fibrillation with RVR (HCC)   CKD (chronic kidney disease), stage III [facility])   Brief Summary: 77 year old male with PMH of HTN, HLD, prostate cancer status post prostatectomy, atrial fibrillation status post ablation and not on anticoagulants PTA, stage III chronic kidney disease (patient not aware PTA) presented with left-sided pleuritic chest pain ongoing for a few days but worsened night prior to admission, associated with difficulty taking deep breaths without cough.  In ED, noted to be in A. fib with RVR, not hypoxic.  Cardiology and pulmonology consulted.  Status post US guided diagnostic and therapeutic left thoracentesis 4/6.   Assessment & Plan:   Left pleuritic chest pain/Left pleural effusion: CTA chest 4/4: No PE but showed medium-sized left pleural effusion with associated atelectasis.  Patient denies symptoms suggestive of pneumonia or URTI recently or sickly contacts.  However had low-grade fever.  Pulmonology consulted, repeated chest x-ray and underwent ultrasound-guided diagnostic and therapeutic left thoracentesis by IR on 4/6 yielding 480 mL of bloody fluid.  Post-tap x-ray showed decreased effusion and no pneumothorax.  Fluid  seems exudative in nature and Gram stain without organisms.    Preliminary pleural fluid culture results negative to date. Ongoing intermittent low-grade fevers otherwise symptomatically and radiologically better.  Follow pleural fluid cultures and cytology results. Added incentive spirometry.  Denies any further symptoms.  No dyspnea or chest pain.  I reviewed with Dr. Elsworth Soho, consulting Pulmonologist, since pleural fluid is predominantly neutrophilic, concern for infectious etiology and he recommended treating with 5 days of levofloxacin.  Has follow-up appointment with pulmonology.  Will need repeat chest x-ray to ensure resolution of pleural effusion.  Paroxysmal A. fib with RVR: Cardiology consulted.  Continue verapamil for rate control. Echo shows normal LV function.  TSH normal. Outpatient follow-up with Dr. Lovena Le.  Eliquis was temporarily held for thoracentesis and was resumed 4/7.  I discussed with Dr. Johnsie Cancel, cardiology who advised to stop Aspirin at discharge.  Essential hypertension: Continue verapamil.  Benazepril which was temporarily held on 4/6 due to elevated creatinine resumed again 4/7.  Stage III chronic kidney disease: Patient was not aware of this diagnosis and now is aware.  Outpatient follow-up.  Patient's baseline creatinine probably fluctuates in the 1.2-1.6 range.  Presented with creatinine of 1.29 which had mildly increased to 1.48 on 4/6.  He did receive contrast for CTA chest.  Temporarily held benazepril on 4/6, encourage oral fluids, creatinine down to 1.3.  Resumed benazepril.  Follow BMP closely as outpatient.  Hyperlipidemia: Allergic to statins.  Continue fenofibrate.     Consultants:  Cardiology Pulmonology IR  Procedures:  Ultrasound-guided diagnostic and therapeutic left thoracentesis by IR 4/6     Discharge Instructions  Discharge Instructions    Call MD for:  difficulty breathing, headache or visual disturbances  Complete by:  As  directed    Call MD for:  extreme fatigue   Complete by:  As directed    Call MD for:  persistant dizziness or light-headedness   Complete by:  As directed    Call MD for:  severe uncontrolled pain   Complete by:  As directed    Call MD for:  temperature >100.4   Complete by:  As directed    Diet - low sodium heart healthy   Complete by:  As directed    Increase activity slowly   Complete by:  As directed        Medication List    STOP taking these medications   aspirin EC 81 MG tablet     TAKE these medications   ALPRAZolam 0.5 MG tablet Commonly known as:  XANAX TAKE 1 TABLET AT BEDTIME AS NEEDED FOR ANXIETY   apixaban 5 MG Tabs tablet Commonly known as:  ELIQUIS Take 1 tablet (5 mg total) by mouth 2 (two) times daily.   benazepril 40 MG tablet Commonly known as:  LOTENSIN TAKE 1 TABLET BY MOUTH  DAILY   fenofibrate 160 MG tablet TAKE 1 TABLET BY MOUTH  DAILY   levofloxacin 750 MG tablet Commonly known as:  LEVAQUIN Take 1 tablet (750 mg total) by mouth daily. Start taking on:  11/25/2017   multivitamin with minerals Tabs tablet Take 1 tablet by mouth daily.   oxybutynin 10 MG 24 hr tablet Commonly known as:  DITROPAN-XL Take 10 mg by mouth daily.   Turmeric 500 MG Caps Take 1 capsule by mouth daily.   verapamil 240 MG CR tablet Commonly known as:  CALAN-SR TAKE 1 TABLET BY MOUTH AT  BEDTIME   Vitamin B-12 5000 MCG Subl Place 5,000 mcg under the tongue daily.   Vitamin C 500 MG Chew Chew 1,000 mg by mouth daily.   Vitamin D3 2000 units Tabs Take 2,000 Units by mouth daily.      Follow-up Information    Parrett, Fonnie Mu, NP Follow up on 11/28/2017.   Specialty:  Pulmonary Disease Why:  4 pm Contact information: [address] [place] [address] [phone]        Evans Lance, MD Follow up on 12/12/2017.   Specialty:  Cardiology Why:  Please arrive 15 minutes early for your 4:00pm appointment Contact information: [address] [place] [address] [phone]        Lucille Passy, MD. Schedule an appointment as soon as possible for a visit in 4 day(s).   Specialty:  Family Medicine Why:  To be seen with repeat labs (CBC & BMP). Contact information: [address] [place] [address] [phone]          Allergies  Allergen Reactions  . Eucalyptol     Head stops up  . Statins Other (See Comments)    Body aches, memory loss      Procedures/Studies: Dg Chest 1 View  Result Date: 11/22/2017 CLINICAL DATA:  Status post thoracentesis EXAM: CHEST  1 VIEW COMPARISON:  November 22, 2017 FINDINGS: No pneumothorax after left thoracentesis. A small left effusion remains, smaller in the interval. Opacity under the effusion is likely atelectasis. No other interval changes. IMPRESSION: Persistent small left pleural effusion, improved in the interval after thoracentesis. No pneumothorax. Electronically Signed   By: Dorise Bullion III M.D   On: 11/22/2017 09:43   Dg Chest 2 View  Result Date: 11/23/2017  CLINICAL DATA:  Followup left pleural effusion. EXAM: CHEST - 2 VIEW COMPARISON:  11/21/2017. FINDINGS: Small to moderate-sized left pleural effusion is stable from the previous day's exam. There is mild associated lung base atelectasis. Remainder of the lungs is clear. No right pleural effusion. No pneumothorax. Heart, mediastinum and hila are unremarkable. Skeletal structures are intact. IMPRESSION: 1. Small to moderate-sized left pleural effusion without change previous day's study. Mild associated left lung base atelectasis. No convincing pneumonia or pulmonary edema. Electronically Signed   By: Lajean Manes M.D.   On: 11/23/2017 10:49   Dg Chest 2 View  Result Date: 11/23/2017 CLINICAL DATA:  Encounter for pleural effusion. EXAM: CHEST - 2 VIEW COMPARISON:  11/22/2017 at 9:32 a.m. the FINDINGS: Small left pleural effusion obscures the left hemidiaphragm. Is mild associated left lung base  opacity consistent with atelectasis. Remainder of the lungs is clear. No right pleural effusion. No pneumothorax. Cardiac silhouette is normal in size and configuration. No mediastinal hilar masses or convincing adenopathy. Skeletal structures are intact. IMPRESSION: 1. Small left pleural effusion mild associated left lung base atelectasis. No evidence of pneumonia or pulmonary edema. Electronically Signed   By: Lajean Manes M.D.   On: 11/23/2017 10:48   Dg Chest 2 View  Result Date: 11/21/2017 CLINICAL DATA:  Left pleural effusion EXAM: CHEST - 2 VIEW COMPARISON:  CT and x-ray earlier today FINDINGS: Small left pleural effusion slightly increased from earlier today. Mild left lower lobe atelectasis/infiltrate unchanged. Right lung clear.  Negative for heart failure. IMPRESSION: Small left pleural effusion and left lower lobe atelectasis/infiltrate. Mild interval increase in small left effusion. Electronically Signed   By: Franchot Gallo M.D.   On: 11/21/2017 13:10   Dg Chest 2 View  Result Date: 11/20/2017 CLINICAL DATA:  Chest pain EXAM: CHEST - 2 VIEW COMPARISON:  08/09/2011 FINDINGS: Small left-sided pleural effusion. Airspace disease at the left lung base. Normal heart size. No pneumothorax. IMPRESSION: Small left-sided pleural effusion with left basilar airspace disease suspicious for pneumonia. Radiographic follow-up to resolution recommended. Electronically Signed   By: Donavan Foil M.D.   On: 11/20/2017 01:25   Ct Angio Chest Pe W And/or Wo Contrast  Result Date: 11/20/2017 CLINICAL DATA:  Left chest pain history of prostate cancer. Shortness of breath. EXAM: CT ANGIOGRAPHY CHEST WITH CONTRAST TECHNIQUE: Multidetector CT imaging of the chest was performed using the standard protocol during bolus administration of intravenous contrast. Multiplanar CT image reconstructions and MIPs were obtained to evaluate the vascular anatomy. CONTRAST:  114mL ISOVUE-370 IOPAMIDOL (ISOVUE-370) INJECTION 76%  COMPARISON:  Chest radiograph 11/20/2017 FINDINGS: Cardiovascular: Contrast injection is sufficient to demonstrate satisfactory opacification of the pulmonary arteries to the segmental level. There is no pulmonary embolus. The main pulmonary artery is within normal limits for size. There is a normal 3-vessel arch branching pattern without evidence of acute aortic syndrome. There is noaortic atherosclerosis. Heart size is normal, without pericardial effusion. There are coronary artery calcifications. Mediastinum/Nodes: No mediastinal, hilar or axillary lymphadenopathy. The visualized thyroid and thoracic esophageal course are unremarkable. Lungs/Pleura: Medium-sized left pleural effusion. There is associated left basilar atelectasis. Upper Abdomen: Contrast bolus timing is not optimized for evaluation of the abdominal organs. Within this limitation, the visualized organs of the upper abdomen are normal. Musculoskeletal: Flowing midthoracic osteophytosis. Review of the MIP images confirms the above findings. IMPRESSION: 1. No pulmonary embolus. 2. Medium-sized left pleural effusion with associated atelectasis. Electronically Signed   By: Ulyses Jarred M.D.   On: 11/20/2017 05:30  US Thoracentesis Asp Pleural Space W/img Guide  Result Date: 11/22/2017 INDICATION: Patient with prior history of prostate cancer, chronic kidney disease, left pleural effusion. Request received for diagnostic and therapeutic left thoracentesis. EXAM: ULTRASOUND GUIDED DIAGNOSTIC AND THERAPEUTIC LEFT THORACENTESIS MEDICATIONS: None COMPLICATIONS: None immediate. PROCEDURE: An ultrasound guided thoracentesis was thoroughly discussed with the patient and questions answered. The benefits, risks, alternatives and complications were also discussed. The patient understands and wishes to proceed with the procedure. Written consent was obtained. Ultrasound was performed to localize and mark an adequate pocket of fluid in the left chest. The area  was then prepped and draped in the normal sterile fashion. 1% Lidocaine was used for local anesthesia. Under ultrasound guidance a 6 Fr Safe-T-Centesis catheter was introduced. Thoracentesis was performed. The catheter was removed and a dressing applied. FINDINGS: A total of approximately 480 cc of bloody fluid was removed. Samples were sent to the laboratory as requested by the clinical team. IMPRESSION: Successful ultrasound guided diagnostic and therapeutic left thoracentesis yielding 480 cc of pleural fluid. Read by: Rowe Robert, PA-C Electronically Signed   By: Jacqulynn Cadet M.D.   On: 11/22/2017 09:36      Subjective: Patient was interviewed and examined with his spouse in the room.  Denies any further complaints.  States that he feels "fine".  No further chest pain or dyspnea.  Denies fevers.  Discharge Exam:  Vitals:   11/23/17 1301 11/23/17 1953 11/23/17 2134 11/24/17 0500  BP: (!) 152/84 (!) 154/85 (!) 156/84 131/69  Pulse: 73   60  Resp: 18     Temp: 98.3 F (36.8 C) 99.7 F (37.6 C)  99.7 F (37.6 C)  TempSrc: Oral Oral  Oral  SpO2: 96%   97%  Weight:      Height:        General exam: Pleasant elderly male seen ambulating comfortably in the room. Respiratory system: Slightly diminished breath sounds in the left base but otherwise clear to auscultation.  No increased work of breathing. Cardiovascular system: S1 & S2 heard, RRR. No JVD, murmurs, rubs, gallops or clicks. No pedal edema.  Gastrointestinal system: Abdomen is nondistended, soft and nontender. No organomegaly or masses felt. Normal bowel sounds heard.   Central nervous system: Alert and oriented. No focal neurological deficits.  Extremities: Symmetric 5 x 5 power. Skin: No rashes, lesions or ulcers Psychiatry: Judgement and insight appear normal. Mood & affect appropriate.       The results of significant diagnostics from this hospitalization (including imaging, microbiology, ancillary and laboratory)  are listed below for reference.     Microbiology: Recent Results (from the past 240 hour(s))  Culture, body fluid-bottle     Status: None (Preliminary result)   Collection Time: 11/22/17  9:42 AM  Result Value Ref Range Status   Specimen Description PLEURAL LEFT  Final   Special Requests NONE  Final   Culture   Final    NO GROWTH 1 DAY Performed at [facility], [address] [place], [place] [address]    Report Status PENDING  Incomplete  Gram stain     Status: None   Collection Time: 11/22/17  9:42 AM  Result Value Ref Range Status   Specimen Description PLEURAL LEFT  Final   Special Requests NONE  Final   Gram Stain   Final    MODERATE WBC PRESENT,BOTH PMN AND MONONUCLEAR NO ORGANISMS SEEN Performed at [facility], [address] [place], [place] [address]  Report Status 11/22/2017 FINAL  Final     Labs: CBC: Recent Labs  Lab 11/20/17 0059 11/21/17 0300 11/23/17 0247  WBC 8.2 8.0 8.9  NEUTROABS  --  4.7  --   HGB 15.8 14.1 14.2  HCT 46.2 42.3 41.9  MCV 91.5 91.4 90.5  PLT 233 217 588   Basic Metabolic Panel: Recent Labs  Lab 11/20/17 0059 11/20/17 0944 11/21/17 0300 11/22/17 0150 11/23/17 0247  NA 137  --  137 138 137  K 3.7  --  3.8 3.6 3.9  CL 106  --  104 106 104  CO2 22  --  22 23 23   GLUCOSE 165*  --  127* 131* 128*  BUN 17  --  16 17 16   CREATININE 1.29*  --  1.32* 1.48* 1.31*  CALCIUM 9.4  --  8.3* 8.6* 8.7*  MG  --  2.0 2.1  --   --    Liver Function Tests: Recent Labs  Lab 11/21/17 0300  AST 22  ALT 20  ALKPHOS 65  BILITOT 0.8  PROT 6.0*  ALBUMIN 2.7*   BNP (last 3 results) Recent Labs    11/20/17 0223  BNP 91.3   Cardiac Enzymes: Recent Labs  Lab 11/20/17 0412 11/20/17 0944 11/20/17 1521  TROPONINI <0.03 <0.03 <0.03   Urinalysis    Component Value Date/Time   COLORURINE YELLOW 11/20/2017 0727   APPEARANCEUR CLEAR 11/20/2017 0727   LABSPEC 1.040 (H) 11/20/2017 0727   PHURINE 6.0 11/20/2017 0727    GLUCOSEU NEGATIVE 11/20/2017 0727   GLUCOSEU NEGATIVE 01/14/2017 1011   HGBUR NEGATIVE 11/20/2017 0727   HGBUR negative 05/26/2009 1408   BILIRUBINUR NEGATIVE 11/20/2017 0727   BILIRUBINUR neg 05/18/2015 1507   KETONESUR NEGATIVE 11/20/2017 0727   PROTEINUR NEGATIVE 11/20/2017 0727   UROBILINOGEN 0.2 01/14/2017 1011   NITRITE NEGATIVE 11/20/2017 0727   LEUKOCYTESUR NEGATIVE 11/20/2017 0727    Discussed in detail with patient's spouse at bedside.  Updated care and answered questions.  Time coordinating discharge: Over 30 minutes  SIGNED:  Vernell Leep, MD, FACP, [facility] Pager [phone]  If 7PM-7AM, please contact night-coverage [email or web address] Password [email or web address] 11/24/2017, 1:40 PM

## 2017-11-24 NOTE — Plan of Care (Signed)
Complete prior to Discharge

## 2017-11-26 ENCOUNTER — Telehealth: Payer: Self-pay | Admitting: Behavioral Health

## 2017-11-26 NOTE — Telephone Encounter (Signed)
Transition Care Management Follow-up Telephone Call  PCP: Lucille Passy, MD  Admit date: 11/20/2017 Discharge date: 11/24/2017  Recommendations for Outpatient Follow-up:  1. Dr. Arnette Norris, PCP in 4 days with repeat labs (CBC & BMP). 2. Tammy Parrett, NP/Pulmonology on 11/28/17 at 4 PM.  Please follow final pleural fluid culture and other results and consider repeating chest x-ray when deemed necessary. 3. Dr. Crissie Sickles, EP cardiology on 12/12/17 at 4 PM.  Home Health: None Equipment/Devices: None  Discharge Condition: Improved and stable.   How have you been since you were released from the hospital? Patient stated, "I'm a little tired, but doing better"; taking deep breaths & using incentive spirometry.   Do you understand why you were in the hospital? yes   Do you understand the discharge instructions? yes   Where were you discharged to? Home with spouse.   Items Reviewed:  Medications reviewed: yes  Allergies reviewed: yes  Dietary changes reviewed: yes, heart healthy diet  Referrals reviewed: yes, follow-up with PCP, Cardiology & Pulmonology.   Functional Questionnaire:   Activities of Daily Living (ADLs):   He states they are independent in the following: ambulation, bathing and hygiene, feeding, continence, grooming, toileting and dressing States they require assistance with the following: None   Any transportation issues/concerns?: no   Any patient concerns? no   Confirmed importance and date/time of follow-up visits scheduled yes, 11/27/17 at 11:00 AM.  Provider Appointment booked with Dr. Deborra Medina.  Confirmed with patient if condition begins to worsen call PCP or go to the ER.  Patient was given the office number and encouraged to call back with question or concerns.  : yes

## 2017-11-27 ENCOUNTER — Other Ambulatory Visit: Payer: Self-pay | Admitting: Family Medicine

## 2017-11-27 ENCOUNTER — Ambulatory Visit: Payer: Medicare Other | Admitting: Family Medicine

## 2017-11-27 ENCOUNTER — Encounter: Payer: Self-pay | Admitting: Family Medicine

## 2017-11-27 ENCOUNTER — Other Ambulatory Visit (INDEPENDENT_AMBULATORY_CARE_PROVIDER_SITE_OTHER): Payer: Medicare Other

## 2017-11-27 DIAGNOSIS — N189 Chronic kidney disease, unspecified: Secondary | ICD-10-CM

## 2017-11-27 DIAGNOSIS — R079 Chest pain, unspecified: Secondary | ICD-10-CM

## 2017-11-27 LAB — CBC WITH DIFFERENTIAL/PLATELET
Basophils Absolute: 0 10*3/uL (ref 0.0–0.1)
Basophils Relative: 0.5 % (ref 0.0–3.0)
EOS ABS: 0.1 10*3/uL (ref 0.0–0.7)
EOS PCT: 2.1 % (ref 0.0–5.0)
HCT: 42.6 % (ref 39.0–52.0)
HEMOGLOBIN: 14.3 g/dL (ref 13.0–17.0)
LYMPHS ABS: 1.2 10*3/uL (ref 0.7–4.0)
Lymphocytes Relative: 17.8 % (ref 12.0–46.0)
MCHC: 33.5 g/dL (ref 30.0–36.0)
MCV: 92.2 fl (ref 78.0–100.0)
MONO ABS: 0.8 10*3/uL (ref 0.1–1.0)
Monocytes Relative: 11.5 % (ref 3.0–12.0)
NEUTROS PCT: 68.1 % (ref 43.0–77.0)
Neutro Abs: 4.7 10*3/uL (ref 1.4–7.7)
Platelets: 370 10*3/uL (ref 150.0–400.0)
RBC: 4.62 Mil/uL (ref 4.22–5.81)
RDW: 13.3 % (ref 11.5–15.5)
WBC: 6.9 10*3/uL (ref 4.0–10.5)

## 2017-11-27 LAB — CULTURE, BODY FLUID-BOTTLE: CULTURE: NO GROWTH

## 2017-11-27 LAB — COMPREHENSIVE METABOLIC PANEL
ALBUMIN: 3.6 g/dL (ref 3.5–5.2)
ALK PHOS: 73 U/L (ref 39–117)
ALT: 19 U/L (ref 0–53)
AST: 19 U/L (ref 0–37)
BILIRUBIN TOTAL: 0.5 mg/dL (ref 0.2–1.2)
BUN: 24 mg/dL — AB (ref 6–23)
CO2: 28 mEq/L (ref 19–32)
CREATININE: 1.61 mg/dL — AB (ref 0.40–1.50)
Calcium: 9 mg/dL (ref 8.4–10.5)
Chloride: 102 mEq/L (ref 96–112)
GFR: 44.47 mL/min — ABNORMAL LOW (ref >60.00)
Glucose, Bld: 118 mg/dL — ABNORMAL HIGH (ref 70–99)
POTASSIUM: 4.5 meq/L (ref 3.5–5.1)
SODIUM: 136 meq/L (ref 135–145)
TOTAL PROTEIN: 7.2 g/dL (ref 6.0–8.3)

## 2017-11-27 LAB — CULTURE, BODY FLUID W GRAM STAIN -BOTTLE

## 2017-11-27 NOTE — Progress Notes (Unsigned)
Subjective:   Patient ID: [name], male    DOB: 12-Feb-1941, 77 y.o.   MRN: [id number]  [name] is a pleasant 77 y.o. year old male who presents to clinic today with Hospitalization Follow-up (Patient was seen at [facility] from 4.4.19-4.8.19.  He was C/O left sided chest pain that was 7 out of 10 that worsened with deep breathing.  CXR & CT showed Left sided Pleural Effusion with associated atelectasis.  An ultrasound guided Thoracentesis was completed.  Pleural fluid showed no-growth after 4 days.  He states that he is doing a little better but coughs when he breathes deeply and his throat is always dry.  ED asked that BMP be done at follow-up visit.)  on 11/27/2017  HPI:  Notes reviewed.      Current Outpatient Medications on File Prior to Visit  Medication Sig Dispense Refill  . ALPRAZolam (XANAX) 0.5 MG tablet TAKE 1 TABLET AT BEDTIME AS NEEDED FOR ANXIETY 30 tablet 3  . apixaban (ELIQUIS) 5 MG TABS tablet Take 1 tablet (5 mg total) by mouth 2 (two) times daily. 60 tablet 0  . Ascorbic Acid (VITAMIN C) 500 MG CHEW Chew 1,000 mg by mouth daily.     . benazepril (LOTENSIN) 40 MG tablet TAKE 1 TABLET BY MOUTH  DAILY 90 tablet 1  . Cholecalciferol (VITAMIN D3) 2000 UNITS TABS Take 2,000 Units by mouth daily.     . Cyanocobalamin (VITAMIN B-12) 5000 MCG SUBL Place 5,000 mcg under the tongue daily.     . fenofibrate 160 MG tablet TAKE 1 TABLET BY MOUTH  DAILY 90 tablet 1  . levofloxacin (LEVAQUIN) 750 MG tablet Take 1 tablet (750 mg total) by mouth daily. 4 tablet 0  . Multiple Vitamin (MULTIVITAMIN WITH MINERALS) TABS Take 1 tablet by mouth daily.    Marland Kitchen oxybutynin (DITROPAN-XL) 10 MG 24 hr tablet Take 10 mg by mouth daily.  11  . Turmeric 500 MG CAPS Take 1 capsule by mouth daily.    . verapamil (CALAN-SR) 240 MG CR tablet TAKE 1 TABLET BY MOUTH AT  BEDTIME 90 tablet 3  . [DISCONTINUED] pravastatin (PRAVACHOL) 20 MG tablet Take 1 tablet (20 mg total) by mouth daily. 30 tablet 6    No current facility-administered medications on file prior to visit.     Allergies  Allergen Reactions  . Eucalyptol     Head stops up  . Statins Other (See Comments)    Body aches, memory loss    Past Medical History:  Diagnosis Date  . Atrial flutter ([place]) 03-2012  . Chronic back pain   . Headache(784.0)    migraines x 3 in life  . Hearing loss   . Hyperlipidemia   . Hypertension   . HYPOGONADISM, MALE 10/12/2007   Followup per urology    . Melanoma ([place])    melanoma left forearm 2005- no chemo  . PROSTATE CANCER, HX OF 10/06/2008   Status post surgery approximately 2010      Past Surgical History:  Procedure Laterality Date  . ABLATION  01-24-2014   repeat CTI ablation by Dr Lovena Le  . ABLATION OF DYSRHYTHMIC FOCUS  10/15/2012   CTI ablation by Dr Lovena Le for atrial flutter  . arhtroscopy  right knee  2010  . ATRIAL FLUTTER ABLATION N/A 10/15/2012   Procedure: ATRIAL FLUTTER ABLATION;  Surgeon: Evans Lance, MD;  Location: [facility] CATH LAB;  Service: Cardiovascular;  Laterality: N/A;  . ATRIAL FLUTTER ABLATION N/A 01/24/2014  Procedure: ATRIAL FLUTTER ABLATION;  Surgeon: Evans Lance, MD;  Location: [facility] CATH LAB;  Service: Cardiovascular;  Laterality: N/A;  . BACK SURGERY  07/2006   lumbar discectomy  . CARDIAC CATHETERIZATION     15 years ago  . CARDIOVERSION  05/18/2012   Procedure: CARDIOVERSION;  Surgeon: Peter M Martinique, MD;  Location: [facility] ENDOSCOPY;  Service: Cardiovascular;  Laterality: N/A;  on eloquis on-going  . COLONOSCOPY W/ POLYPECTOMY    . LUMBAR LAMINECTOMY/DECOMPRESSION MICRODISCECTOMY  08/16/2011   Procedure: LUMBAR LAMINECTOMY/DECOMPRESSION MICRODISCECTOMY;  Surgeon: Dahlia Bailiff;  Location: [facility] ORS;  Service: Orthopedics;  Laterality: N/A;  Lumbar Decompression and Foraminotomy L4-S1    . MELANOMA EXCISION  12/13/03   [place] [facility]  . PROSTATECTOMY  07/2008  . ROTATOR CUFF REPAIR  2008   right; Dr Justice Britain  . TONSILLECTOMY  1947    Family  History  Problem Relation Age of Onset  . Pancreatic cancer Mother   . Colon cancer Brother   . Esophageal cancer Neg Hx   . Stomach cancer Neg Hx   . Rectal cancer Neg Hx     Social History   Socioeconomic History  . Marital status: Married    Spouse name: Not on file  . Number of children: 2  . Years of education: Not on file  . Highest education level: Not on file  Occupational History  . Occupation: Film/video editor  Social Needs  . Financial resource strain: Not on file  . Food insecurity:    Worry: Not on file    Inability: Not on file  . Transportation needs:    Medical: Not on file    Non-medical: Not on file  Tobacco Use  . Smoking status: Former Smoker    Packs/day: 1.00    Years: 20.00    Pack years: 20.00    Types: Cigarettes    Last attempt to quit: 08/08/1984    Years since quitting: 33.3  . Smokeless tobacco: Never Used  Substance and Sexual Activity  . Alcohol use: Yes    Alcohol/week: 1.2 oz    Types: 2 Cans of beer per week    Comment: socially   2 beer or mixed drinks week  . Drug use: No  . Sexual activity: Never  Lifestyle  . Physical activity:    Days per week: Not on file    Minutes per session: Not on file  . Stress: Not on file  Relationships  . Social connections:    Talks on phone: Not on file    Gets together: Not on file    Attends religious service: Not on file    Active member of club or organization: Not on file    Attends meetings of clubs or organizations: Not on file    Relationship status: Not on file  . Intimate partner violence:    Fear of current or ex partner: Not on file    Emotionally abused: Not on file    Physically abused: Not on file    Forced sexual activity: Not on file  Other Topics Concern  . Not on file  Social History Narrative   in [place] since '84   Desires CPR   The PMH, [place], Social History, Family History, Medications, and allergies have been reviewed in [facility], and have been updated if  relevant.   Review of Systems     Objective:    BP (!) 110/54 (BP Location: Left Arm, Patient Position: Sitting, Cuff Size:  Large)   Pulse 68   Temp 98 F (36.7 C) (Oral)   Ht 5' 11.5" (1.816 m)   Wt 200 lb 12.8 oz (91.1 kg)   SpO2 95%   BMI 27.62 kg/m    Physical Exam        Assessment & Plan:   No diagnosis found. No follow-ups on file.

## 2017-11-28 ENCOUNTER — Ambulatory Visit: Payer: Medicare Other | Admitting: Adult Health

## 2017-11-28 ENCOUNTER — Ambulatory Visit (INDEPENDENT_AMBULATORY_CARE_PROVIDER_SITE_OTHER)
Admission: RE | Admit: 2017-11-28 | Discharge: 2017-11-28 | Disposition: A | Discharge status: Home / Self Care | Payer: Medicare Other | Source: Ambulatory Visit | Attending: Adult Health | Admitting: Adult Health

## 2017-11-28 ENCOUNTER — Encounter: Payer: Self-pay | Admitting: Adult Health

## 2017-11-28 VITALS — BP 116/64 | HR 77 | Ht 71.5 in | Wt 201.0 lb

## 2017-11-28 DIAGNOSIS — J910 Malignant pleural effusion: Secondary | ICD-10-CM | POA: Insufficient documentation

## 2017-11-28 DIAGNOSIS — J90 Pleural effusion, not elsewhere classified: Secondary | ICD-10-CM

## 2017-11-28 DIAGNOSIS — J9811 Atelectasis: Secondary | ICD-10-CM | POA: Diagnosis not present

## 2017-11-28 NOTE — Patient Instructions (Addendum)
Finish Levaquin today as discussed.  Follow up with Dr. Elsworth Soho  In 6 weeks with chest xray and As needed

## 2017-11-28 NOTE — Progress Notes (Signed)
@Patient  ID: [name], male    DOB: 1941/01/15, 77 y.o.   MRN: [id number]  Chief Complaint  Patient presents with  . Follow-up    pleural effusion     Referring provider: Lucille Passy, MD  HPI: 77 year old male seen for pulmonary consult during hospitalization April 2019 for left pleural effusion  TEST  CT chest November 20, 2017> negative for PE, medium sized left pleural effusion with associated atelectasis Left thoracentesis November 22, 2017 with 480 cc of bloody fluid removed, cytology negative for malignant cells, pleural fluid culture negative  11/28/2017 Follow up : Left pleural effusion Patient presents for a post hospital follow-up.  Patient was recently hospitalized earlier this month for left-sided pleuritic pain.  A CT chest was done and was negative for a pulmonary embolism.  This did show a medium size left pleural effusion with associated atelectasis.  Patient underwent a left thoracentesis on November 22, 2017 with bloody fluid removal.  Cytology was negative for malignant cells.  Pleural fluid cultures were negative.  Patient was treated with antibiotics.  He was discharged on Levaquin.  He took his last dose of Levaquin today.  Patient says he is feeling better.  He has decreased pleuritic pain.  And feels he can take in a deep breath.  He denies any fever, chills, cough, congestion.  Chest x-ray reviewed independently and shows no reaccumulation with a small trace left pleural effusion remaining.   Allergies  Allergen Reactions  . Eucalyptol     Dried Eucalytus:  Head stops up  . Statins Other (See Comments)    Body aches, memory loss    Immunization History  Administered Date(s) Administered  . Influenza Split 05/16/2011, 06/25/2012  . Influenza Whole 05/26/2009  . Influenza, High Dose Seasonal PF 05/26/2013, 05/18/2015  . Influenza,inj,Quad PF,6+ Mos 05/24/2014, 05/30/2016, 06/06/2017  . Pneumococcal Conjugate-13 01/03/2014  . Pneumococcal Polysaccharide-23  08/20/2007  . Td 08/19/1989, 10/09/2007  . Tdap 03/22/2015  . Zoster 10/09/2007    Past Medical History:  Diagnosis Date  . Atrial flutter ([place]) 03-2012  . Chronic back pain   . Headache(784.0)    migraines x 3 in life  . Hearing loss   . Hyperlipidemia   . Hypertension   . HYPOGONADISM, MALE 10/12/2007   Followup per urology    . Melanoma ([place])    melanoma left forearm 2005- no chemo  . PROSTATE CANCER, HX OF 10/06/2008   Status post surgery approximately 2010      Tobacco History: Social History   Tobacco Use  Smoking Status Former Smoker  . Packs/day: 1.00  . Years: 20.00  . Pack years: 20.00  . Types: Cigarettes  . Last attempt to quit: 08/08/1984  . Years since quitting: 33.3  Smokeless Tobacco Never Used   Counseling given: Not Answered   Outpatient Encounter Medications as of 11/28/2017  Medication Sig  . ALPRAZolam (XANAX) 0.5 MG tablet TAKE 1 TABLET AT BEDTIME AS NEEDED FOR ANXIETY  . apixaban (ELIQUIS) 5 MG TABS tablet Take 1 tablet (5 mg total) by mouth 2 (two) times daily.  . Ascorbic Acid (VITAMIN C) 500 MG CHEW Chew 1,000 mg by mouth daily.   . benazepril (LOTENSIN) 40 MG tablet TAKE 1 TABLET BY MOUTH  DAILY  . Cholecalciferol (VITAMIN D3) 2000 UNITS TABS Take 2,000 Units by mouth daily.   . Cyanocobalamin (VITAMIN B-12) 5000 MCG SUBL Place 5,000 mcg under the tongue daily.   . fenofibrate 160 MG tablet TAKE 1  TABLET BY MOUTH  DAILY  . levofloxacin (LEVAQUIN) 750 MG tablet Take 1 tablet (750 mg total) by mouth daily.  . Multiple Vitamin (MULTIVITAMIN WITH MINERALS) TABS Take 1 tablet by mouth daily.  Marland Kitchen oxybutynin (DITROPAN-XL) 10 MG 24 hr tablet Take 10 mg by mouth daily.  . Turmeric 500 MG CAPS Take 1 capsule by mouth daily.  . verapamil (CALAN-SR) 240 MG CR tablet TAKE 1 TABLET BY MOUTH AT  BEDTIME  . [DISCONTINUED] pravastatin (PRAVACHOL) 20 MG tablet Take 1 tablet (20 mg total) by mouth daily.   No facility-administered encounter medications on  file as of 11/28/2017.      Review of Systems  Constitutional:   No  weight loss, night sweats,  Fevers, chills,  +fatigue, or  lassitude.  HEENT:   No headaches,  Difficulty swallowing,  Tooth/dental problems, or  Sore throat,                No sneezing, itching, ear ache, nasal congestion, post nasal drip,   CV:  No chest pain,  Orthopnea, PND, swelling in lower extremities, anasarca, dizziness, palpitations, syncope.   GI  No heartburn, indigestion, abdominal pain, nausea, vomiting, diarrhea, change in bowel habits, loss of appetite, bloody stools.   Resp: No shortness of breath with exertion or at rest.  No excess mucus, no productive cough,  No non-productive cough,  No coughing up of blood.  No change in color of mucus.  No wheezing.  No chest wall deformity  Skin: no rash or lesions.  GU: no dysuria, change in color of urine, no urgency or frequency.  No flank pain, no hematuria   MS:  No joint pain or swelling.  No decreased range of motion.  No back pain.    Physical Exam  BP 116/64 (BP Location: Left Arm, Cuff Size: Normal)   Pulse 77   Ht 5' 11.5" (1.816 m)   Wt 201 lb (91.2 kg)   SpO2 98%   BMI 27.64 kg/m   GEN: A/Ox3; pleasant , NAD, elderly    HEENT:  [place]/AT,  EACs-clear, TMs-wnl, NOSE-clear, THROAT-clear, no lesions, no postnasal drip or exudate noted.   NECK:  Supple w/ fair ROM; no JVD; normal carotid impulses w/o bruits; no thyromegaly or nodules palpated; no lymphadenopathy.    RESP  Clear  P & A; w/o, wheezes/ rales/ or rhonchi. no accessory muscle use, no dullness to percussion  CARD:  RRR, no m/r/g, no peripheral edema, pulses intact, no cyanosis or clubbing.  GI:   Soft & nt; nml bowel sounds; no organomegaly or masses detected.   Musco: Warm bil, no deformities or joint swelling noted.   Neuro: alert, no focal deficits noted.    Skin: Warm, no lesions or rashes    Lab Results:  CBC  BMET  ProBNP No results found for:  PROBNP  Imaging: Dg Chest 1 View  Result Date: 11/22/2017 CLINICAL DATA:  Status post thoracentesis EXAM: CHEST  1 VIEW COMPARISON:  November 22, 2017 FINDINGS: No pneumothorax after left thoracentesis. A small left effusion remains, smaller in the interval. Opacity under the effusion is likely atelectasis. No other interval changes. IMPRESSION: Persistent small left pleural effusion, improved in the interval after thoracentesis. No pneumothorax. Electronically Signed   By: Dorise Bullion III M.D   On: 11/22/2017 09:43   Dg Chest 2 View  Result Date: 11/28/2017 CLINICAL DATA:  Follow-up left pleural effusion. EXAM: CHEST - 2 VIEW COMPARISON:  Chest x-ray dated November 23, 2017. FINDINGS: The heart size and mediastinal contours are within normal limits. Normal pulmonary vascularity. Unchanged small left pleural effusion with adjacent left lower lobe atelectasis. The right lung is clear. No consolidation or pneumothorax. No acute osseous abnormality. IMPRESSION: Unchanged small left pleural effusion with adjacent left lower lobe atelectasis. Electronically Signed   By: Titus Dubin M.D.   On: 11/28/2017 16:31   Dg Chest 2 View  Result Date: 11/23/2017 CLINICAL DATA:  Followup left pleural effusion. EXAM: CHEST - 2 VIEW COMPARISON:  11/21/2017. FINDINGS: Small to moderate-sized left pleural effusion is stable from the previous day's exam. There is mild associated lung base atelectasis. Remainder of the lungs is clear. No right pleural effusion. No pneumothorax. Heart, mediastinum and hila are unremarkable. Skeletal structures are intact. IMPRESSION: 1. Small to moderate-sized left pleural effusion without change previous day's study. Mild associated left lung base atelectasis. No convincing pneumonia or pulmonary edema. Electronically Signed   By: Lajean Manes M.D.   On: 11/23/2017 10:49   Dg Chest 2 View  Result Date: 11/23/2017 CLINICAL DATA:  Encounter for pleural effusion. EXAM: CHEST - 2 VIEW  COMPARISON:  11/22/2017 at 9:32 a.m. the FINDINGS: Small left pleural effusion obscures the left hemidiaphragm. Is mild associated left lung base opacity consistent with atelectasis. Remainder of the lungs is clear. No right pleural effusion. No pneumothorax. Cardiac silhouette is normal in size and configuration. No mediastinal hilar masses or convincing adenopathy. Skeletal structures are intact. IMPRESSION: 1. Small left pleural effusion mild associated left lung base atelectasis. No evidence of pneumonia or pulmonary edema. Electronically Signed   By: Lajean Manes M.D.   On: 11/23/2017 10:48   Dg Chest 2 View  Result Date: 11/21/2017 CLINICAL DATA:  Left pleural effusion EXAM: CHEST - 2 VIEW COMPARISON:  CT and x-ray earlier today FINDINGS: Small left pleural effusion slightly increased from earlier today. Mild left lower lobe atelectasis/infiltrate unchanged. Right lung clear.  Negative for heart failure. IMPRESSION: Small left pleural effusion and left lower lobe atelectasis/infiltrate. Mild interval increase in small left effusion. Electronically Signed   By: Franchot Gallo M.D.   On: 11/21/2017 13:10   Dg Chest 2 View  Result Date: 11/20/2017 CLINICAL DATA:  Chest pain EXAM: CHEST - 2 VIEW COMPARISON:  08/09/2011 FINDINGS: Small left-sided pleural effusion. Airspace disease at the left lung base. Normal heart size. No pneumothorax. IMPRESSION: Small left-sided pleural effusion with left basilar airspace disease suspicious for pneumonia. Radiographic follow-up to resolution recommended. Electronically Signed   By: Donavan Foil M.D.   On: 11/20/2017 01:25   Ct Angio Chest Pe W And/or Wo Contrast  Result Date: 11/20/2017 CLINICAL DATA:  Left chest pain history of prostate cancer. Shortness of breath. EXAM: CT ANGIOGRAPHY CHEST WITH CONTRAST TECHNIQUE: Multidetector CT imaging of the chest was performed using the standard protocol during bolus administration of intravenous contrast. Multiplanar CT  image reconstructions and MIPs were obtained to evaluate the vascular anatomy. CONTRAST:  170mL ISOVUE-370 IOPAMIDOL (ISOVUE-370) INJECTION 76% COMPARISON:  Chest radiograph 11/20/2017 FINDINGS: Cardiovascular: Contrast injection is sufficient to demonstrate satisfactory opacification of the pulmonary arteries to the segmental level. There is no pulmonary embolus. The main pulmonary artery is within normal limits for size. There is a normal 3-vessel arch branching pattern without evidence of acute aortic syndrome. There is noaortic atherosclerosis. Heart size is normal, without pericardial effusion. There are coronary artery calcifications. Mediastinum/Nodes: No mediastinal, hilar or axillary lymphadenopathy. The visualized thyroid and thoracic esophageal course are unremarkable. Lungs/Pleura: Medium-sized  left pleural effusion. There is associated left basilar atelectasis. Upper Abdomen: Contrast bolus timing is not optimized for evaluation of the abdominal organs. Within this limitation, the visualized organs of the upper abdomen are normal. Musculoskeletal: Flowing midthoracic osteophytosis. Review of the MIP images confirms the above findings. IMPRESSION: 1. No pulmonary embolus. 2. Medium-sized left pleural effusion with associated atelectasis. Electronically Signed   By: Ulyses Jarred M.D.   On: 11/20/2017 05:30   US Thoracentesis Asp Pleural Space W/img Guide  Result Date: 11/22/2017 INDICATION: Patient with prior history of prostate cancer, chronic kidney disease, left pleural effusion. Request received for diagnostic and therapeutic left thoracentesis. EXAM: ULTRASOUND GUIDED DIAGNOSTIC AND THERAPEUTIC LEFT THORACENTESIS MEDICATIONS: None COMPLICATIONS: None immediate. PROCEDURE: An ultrasound guided thoracentesis was thoroughly discussed with the patient and questions answered. The benefits, risks, alternatives and complications were also discussed. The patient understands and wishes to proceed with the  procedure. Written consent was obtained. Ultrasound was performed to localize and mark an adequate pocket of fluid in the left chest. The area was then prepped and draped in the normal sterile fashion. 1% Lidocaine was used for local anesthesia. Under ultrasound guidance a 6 Fr Safe-T-Centesis catheter was introduced. Thoracentesis was performed. The catheter was removed and a dressing applied. FINDINGS: A total of approximately 480 cc of bloody fluid was removed. Samples were sent to the laboratory as requested by the clinical team. IMPRESSION: Successful ultrasound guided diagnostic and therapeutic left thoracentesis yielding 480 cc of pleural fluid. Read by: Rowe Robert, PA-C Electronically Signed   By: Jacqulynn Cadet M.D.   On: 11/22/2017 09:36     Assessment & Plan:   Pleural effusion on left Left pleural effusion, appeared to be exudative.  Patient is improving after thoracentesis and antibiotics.  Chest x-ray today shows no reaccumulation.  Patient is continues to have a small trace effusion.  We will recheck a chest x-ray in 6 weeks.  May advance activity as tolerated.      Rexene Edison, NP 11/28/2017

## 2017-11-28 NOTE — Assessment & Plan Note (Addendum)
Left pleural effusion, appeared to be exudative.  Patient is improving after thoracentesis and antibiotics.  Chest x-ray today shows no reaccumulation.  Patient is continues to have a small trace effusion.  We will recheck a chest x-ray in 6 weeks.  May advance activity as tolerated.

## 2017-12-02 ENCOUNTER — Telehealth: Payer: Self-pay

## 2017-12-02 DIAGNOSIS — N189 Chronic kidney disease, unspecified: Secondary | ICD-10-CM

## 2017-12-02 NOTE — Telephone Encounter (Signed)
LMOVM for pt to RTC about lab results/that he needs to push fluids and have a Basic Metabolic Panel drawn the end of this week/PEC ok to advise and schedule lab visit/may also tell him exactly what Dr. Deborra Medina said that his kidney function is still higher than it has been/thx dmf

## 2017-12-02 NOTE — Telephone Encounter (Signed)
-----   Message from Lucille Passy, MD sent at 12/01/2017 11:28 AM EDT ----- Please call pt- labs all look good but kidney function still higher than it has been running.  I would like him to push some fluids and to recheck a BMET at the end of the week. How is he feeling?

## 2017-12-04 ENCOUNTER — Other Ambulatory Visit (INDEPENDENT_AMBULATORY_CARE_PROVIDER_SITE_OTHER): Payer: Medicare Other

## 2017-12-04 DIAGNOSIS — N189 Chronic kidney disease, unspecified: Secondary | ICD-10-CM

## 2017-12-04 LAB — BASIC METABOLIC PANEL WITH GFR
BUN: 14 mg/dL (ref 6–23)
CO2: 28 meq/L (ref 19–32)
Calcium: 9.2 mg/dL (ref 8.4–10.5)
Chloride: 104 meq/L (ref 96–112)
Creatinine, Ser: 1.29 mg/dL (ref 0.40–1.50)
GFR: 57.43 mL/min — ABNORMAL LOW (ref >60.00)
Glucose, Bld: 88 mg/dL (ref 70–99)
Potassium: 5.1 meq/L (ref 3.5–5.1)
Sodium: 139 meq/L (ref 135–145)

## 2017-12-08 ENCOUNTER — Other Ambulatory Visit: Payer: Self-pay | Admitting: Family Medicine

## 2017-12-09 NOTE — Telephone Encounter (Signed)
TA-Is this appropriate? Looks like he cancelled and appt with you just after ED visit/no new appt scheduled/plz advise/thx dmf

## 2017-12-12 ENCOUNTER — Ambulatory Visit: Payer: Medicare Other | Admitting: Internal Medicine

## 2017-12-12 ENCOUNTER — Encounter: Payer: Self-pay | Admitting: Internal Medicine

## 2017-12-12 VITALS — BP 138/72 | HR 70 | Ht 71.5 in | Wt 202.0 lb

## 2017-12-12 DIAGNOSIS — J90 Pleural effusion, not elsewhere classified: Secondary | ICD-10-CM

## 2017-12-12 DIAGNOSIS — I4892 Unspecified atrial flutter: Secondary | ICD-10-CM | POA: Diagnosis not present

## 2017-12-12 DIAGNOSIS — I10 Essential (primary) hypertension: Secondary | ICD-10-CM | POA: Diagnosis not present

## 2017-12-12 NOTE — Patient Instructions (Signed)

## 2017-12-12 NOTE — Progress Notes (Signed)
HPI [name] returns today for followup. He is a pleasant 77 yo man with atrial flutter s/p ablation who developed recurrent flutter over a year out from his initial ablation and underwent repeat ablation 3 years ago. In the interim, he has been stable except that he developed an episode of pleuritic chest pain, and was found to have a pleural effusion for which she had 600 cc of fluid removed.  His chest pressure has resolved and his shortness of breath is back to baseline.  He has no trouble sleeping but still requires Xanax.  He notes that during the episode of pleurisy, he had brief episodes of atrial fibrillation.  No other complaints today.  Allergies  Allergen Reactions  . Eucalyptol     Dried Eucalytus:  Head stops up  . Statins Other (See Comments)    Body aches, memory loss     Current Outpatient Medications  Medication Sig Dispense Refill  . ALPRAZolam (XANAX) 0.5 MG tablet TAKE 1 TABLET AT BEDTIME AS NEEDED FOR ANXIETY 30 tablet 1  . apixaban (ELIQUIS) 5 MG TABS tablet Take 1 tablet (5 mg total) by mouth 2 (two) times daily. 60 tablet 0  . Ascorbic Acid (VITAMIN C) 500 MG CHEW Chew 1,000 mg by mouth daily.     . benazepril (LOTENSIN) 40 MG tablet TAKE 1 TABLET BY MOUTH  DAILY 90 tablet 1  . Cholecalciferol (VITAMIN D3) 2000 UNITS TABS Take 2,000 Units by mouth daily.     . Cyanocobalamin (VITAMIN B-12) 5000 MCG SUBL Place 5,000 mcg under the tongue daily.     . fenofibrate 160 MG tablet TAKE 1 TABLET BY MOUTH  DAILY 90 tablet 1  . Multiple Vitamin (MULTIVITAMIN WITH MINERALS) TABS Take 1 tablet by mouth daily.    Marland Kitchen oxybutynin (DITROPAN-XL) 10 MG 24 hr tablet Take 10 mg by mouth daily.  11  . Turmeric 500 MG CAPS Take 1 capsule by mouth daily.    . verapamil (CALAN-SR) 240 MG CR tablet TAKE 1 TABLET BY MOUTH AT  BEDTIME 90 tablet 3   No current facility-administered medications for this visit.      Past Medical History:  Diagnosis Date  . Atrial flutter ([place])  03-2012  . Chronic back pain   . Headache(784.0)    migraines x 3 in life  . Hearing loss   . Hyperlipidemia   . Hypertension   . HYPOGONADISM, MALE 10/12/2007   Followup per urology    . Melanoma ([place])    melanoma left forearm 2005- no chemo  . PROSTATE CANCER, HX OF 10/06/2008   Status post surgery approximately 2010      ROS:   All systems reviewed and negative except as noted in the HPI.   Past Surgical History:  Procedure Laterality Date  . ABLATION  01-24-2014   repeat CTI ablation by Dr Lovena Le  . ABLATION OF DYSRHYTHMIC FOCUS  10/15/2012   CTI ablation by Dr Lovena Le for atrial flutter  . arhtroscopy  right knee  2010  . ATRIAL FLUTTER ABLATION N/A 10/15/2012   Procedure: ATRIAL FLUTTER ABLATION;  Surgeon: Evans Lance, MD;  Location: [facility] CATH LAB;  Service: Cardiovascular;  Laterality: N/A;  . ATRIAL FLUTTER ABLATION N/A 01/24/2014   Procedure: ATRIAL FLUTTER ABLATION;  Surgeon: Evans Lance, MD;  Location: [facility] CATH LAB;  Service: Cardiovascular;  Laterality: N/A;  . BACK SURGERY  07/2006   lumbar discectomy  . CARDIAC CATHETERIZATION  15 years ago  . CARDIOVERSION  05/18/2012   Procedure: CARDIOVERSION;  Surgeon: Peter M Martinique, MD;  Location: [facility] ENDOSCOPY;  Service: Cardiovascular;  Laterality: N/A;  on eloquis on-going  . COLONOSCOPY W/ POLYPECTOMY    . LUMBAR LAMINECTOMY/DECOMPRESSION MICRODISCECTOMY  08/16/2011   Procedure: LUMBAR LAMINECTOMY/DECOMPRESSION MICRODISCECTOMY;  Surgeon: Dahlia Bailiff;  Location: [facility] ORS;  Service: Orthopedics;  Laterality: N/A;  Lumbar Decompression and Foraminotomy L4-S1    . MELANOMA EXCISION  12/13/03   [place] [facility]  . PROSTATECTOMY  07/2008  . ROTATOR CUFF REPAIR  2008   right; Dr Justice Britain  . TONSILLECTOMY  1947     Family History  Problem Relation Age of Onset  . Pancreatic cancer Mother   . Colon cancer Brother   . Esophageal cancer Neg Hx   . Stomach cancer Neg Hx   . Rectal cancer Neg Hx      Social History    Socioeconomic History  . Marital status: Married    Spouse name: Not on file  . Number of children: 2  . Years of education: Not on file  . Highest education level: Not on file  Occupational History  . Occupation: Film/video editor  Social Needs  . Financial resource strain: Not on file  . Food insecurity:    Worry: Not on file    Inability: Not on file  . Transportation needs:    Medical: Not on file    Non-medical: Not on file  Tobacco Use  . Smoking status: Former Smoker    Packs/day: 1.00    Years: 20.00    Pack years: 20.00    Types: Cigarettes    Last attempt to quit: 08/08/1984    Years since quitting: 33.3  . Smokeless tobacco: Never Used  Substance and Sexual Activity  . Alcohol use: Yes    Alcohol/week: 1.2 oz    Types: 2 Cans of beer per week    Comment: socially   2 beer or mixed drinks week  . Drug use: No  . Sexual activity: Never  Lifestyle  . Physical activity:    Days per week: Not on file    Minutes per session: Not on file  . Stress: Not on file  Relationships  . Social connections:    Talks on phone: Not on file    Gets together: Not on file    Attends religious service: Not on file    Active member of club or organization: Not on file    Attends meetings of clubs or organizations: Not on file    Relationship status: Not on file  . Intimate partner violence:    Fear of current or ex partner: Not on file    Emotionally abused: Not on file    Physically abused: Not on file    Forced sexual activity: Not on file  Other Topics Concern  . Not on file  Social History Narrative   in [place] since '84   Desires CPR     BP 138/72   Pulse 70   Ht 5' 11.5" (1.816 m)   Wt 202 lb (91.6 kg)   SpO2 97%   BMI 27.78 kg/m   Physical Exam:  Well appearing 77 year old man, NAD HEENT: Unremarkable Neck: 6 cm JVD, no thyromegally Lymphatics:  No adenopathy Back:  No CVA tenderness Lungs:  Clear, with no wheezes, rales, or rhonchi.  I did  not appreciate egophony on exam. HEART:  Regular rate rhythm, no murmurs, no  rubs, no clicks Abd:  soft, positive bowel sounds, no organomegally, no rebound, no guarding Ext:  2 plus pulses, no edema, no cyanosis, no clubbing Skin:  No rashes no nodules Neuro:  CN II through XII intact, motor grossly intact   DEVICE  Normal device function.  See PaceArt for details.   Assess/Plan: 1.  Atrial flutter/fibrillation -he is status post ablation.  His atrial arrhythmias appear to be well controlled. 2.  Unexplained pleural effusion -he has undergone thoracentesis and no evidence of reaccumulation. 3.  Insomnia -much of his sleep difficulty revolves around prostate problems for which she has to urinate 3-4 times at night.  I have encouraged the patient to maintain a low-sodium diet. 4.  Hypertension -his systolic blood pressure is up a bit today.  He will continue his calcium channel blocker and Lotensin.  He is encouraged to maintain a low-sodium diet.  Crissie Sickles, MD

## 2017-12-22 DIAGNOSIS — C61 Malignant neoplasm of prostate: Secondary | ICD-10-CM | POA: Diagnosis not present

## 2017-12-23 DIAGNOSIS — L821 Other seborrheic keratosis: Secondary | ICD-10-CM | POA: Diagnosis not present

## 2017-12-23 DIAGNOSIS — C44529 Squamous cell carcinoma of skin of other part of trunk: Secondary | ICD-10-CM | POA: Diagnosis not present

## 2017-12-26 ENCOUNTER — Other Ambulatory Visit: Payer: Self-pay | Admitting: Internal Medicine

## 2017-12-26 ENCOUNTER — Telehealth: Payer: Self-pay | Admitting: Internal Medicine

## 2017-12-26 MED ORDER — APIXABAN 5 MG PO TABS: 5.0000 mg | ORAL_TABLET | Freq: Two times a day (BID) | ORAL | 6 refills | Status: DC

## 2017-12-26 NOTE — Telephone Encounter (Signed)
New Message    *STAT* If patient is at the pharmacy, call can be transferred to refill team.   1. Which medications need to be refilled? (please list name of each medication and dose if known) apixaban (ELIQUIS) 5 MG TABS tablet  2. Which pharmacy/location (including street and city if local pharmacy) is medication to be sent to? [facility] [place]   3. Do they need a 30 day or 90 day supply? [place]

## 2017-12-26 NOTE — Telephone Encounter (Signed)
Refill sent in

## 2017-12-26 NOTE — Telephone Encounter (Signed)
Pt last saw Dr Lovena Le 12/12/17, last labs 12/04/17 Creat 1.29, age 77, weight 91.6kg, based on specified criteria pt is on appropriate dosage of Eliquis 5mg  BID. Will refill rx.

## 2018-01-19 ENCOUNTER — Ambulatory Visit: Payer: Medicare Other | Admitting: Pulmonary Disease

## 2018-01-19 ENCOUNTER — Encounter: Payer: Self-pay | Admitting: Pulmonary Disease

## 2018-01-19 ENCOUNTER — Ambulatory Visit (INDEPENDENT_AMBULATORY_CARE_PROVIDER_SITE_OTHER)
Admission: RE | Admit: 2018-01-19 | Discharge: 2018-01-19 | Disposition: A | Discharge status: Home / Self Care | Payer: Medicare Other | Source: Ambulatory Visit | Attending: Pulmonary Disease | Admitting: Pulmonary Disease

## 2018-01-19 DIAGNOSIS — J90 Pleural effusion, not elsewhere classified: Secondary | ICD-10-CM

## 2018-01-19 NOTE — Patient Instructions (Signed)
Chest x-ray today-we will call you with results Likely had pleurisy which is resolved

## 2018-01-19 NOTE — Addendum Note (Signed)
Addended by: Valerie Salts on: 01/19/2018 11:23 AM   Modules accepted: Orders

## 2018-01-19 NOTE — Assessment & Plan Note (Signed)
Appears to have been related to pleurisy, no acute neutrophilic exudate. Obtain chest x-ray today and if this is okay then no further follow-up required

## 2018-01-19 NOTE — Progress Notes (Signed)
   Subjective:    Patient ID: [name], male    DOB: 02/14/1941, 77 y.o.   MRN: [id number]  HPI   77 year old male seen for pulmonary consult during hospitalization April 2019 for left pleural effusion  Past medical history of CKD stage III, hypertension, atrial fibrillation status post ablation  Thoracentesis showed a neutrophilic exudate. He also had atrial fibrillation with RVR and was started on verapamil and anticoagulation with Eliquis.  He has done well overall, reports occasional dyspnea on exertion, no wheezing.  Chest pain has not recurred  Significant tests/ events reviewed  CT angio  chest November 20, 2017> negative for PE, medium sized left pleural effusion with associated atelectasis Left thoracentesis November 22, 2017 with 480 cc of bloody fluid removed, cytology negative for malignant cells, pleural fluid culture negative   Review of Systems Patient denies significant dyspnea,cough, hemoptysis,  chest pain, palpitations, pedal edema, orthopnea, paroxysmal nocturnal dyspnea, lightheadedness, nausea, vomiting, abdominal or  leg pains      Objective:   Physical Exam  Gen. Pleasant, well-nourished, in no distress ENT - no thrush, no post nasal drip Neck: No JVD, no thyromegaly, no carotid bruits Lungs: no use of accessory muscles, no dullness to percussion, clear without rales or rhonchi  Cardiovascular: Rhythm regular, heart sounds  normal, no murmurs or gallops, no peripheral edema Musculoskeletal: No deformities, no cyanosis or clubbing        Assessment & Plan:

## 2018-01-21 ENCOUNTER — Telehealth: Payer: Self-pay | Admitting: Pulmonary Disease

## 2018-01-21 DIAGNOSIS — J90 Pleural effusion, not elsewhere classified: Secondary | ICD-10-CM

## 2018-01-21 NOTE — Telephone Encounter (Signed)
Spoke with pt in the lobby and explained chest x ray results. Pt understood and nothing further is needed. Chest xray order entered.

## 2018-01-22 DIAGNOSIS — C44529 Squamous cell carcinoma of skin of other part of trunk: Secondary | ICD-10-CM | POA: Diagnosis not present

## 2018-02-10 ENCOUNTER — Encounter: Payer: Self-pay | Admitting: Internal Medicine

## 2018-02-10 DIAGNOSIS — C44529 Squamous cell carcinoma of skin of other part of trunk: Secondary | ICD-10-CM | POA: Diagnosis not present

## 2018-02-18 ENCOUNTER — Telehealth: Payer: Self-pay | Admitting: *Deleted

## 2018-02-18 NOTE — Telephone Encounter (Signed)
Received Dermatopathology Report results from [facility], sent to wrong [place] office; forwarded to provider via fax/SLS

## 2018-03-02 ENCOUNTER — Other Ambulatory Visit: Payer: Self-pay | Admitting: Family Medicine

## 2018-03-02 NOTE — Telephone Encounter (Signed)
Dr. Maida Sale know that this pt saw you recently and you state in your note that if he needs a refill on his cardiac medications to contact his pharmacy/He is needing his Verapamil and I was unsure if you meant this as well or if Dr. Deborra Medina was to fill it/just want to be certain/plz advise/thank you M. Albertina Parr

## 2018-03-05 ENCOUNTER — Other Ambulatory Visit: Payer: Self-pay

## 2018-03-26 ENCOUNTER — Encounter: Payer: Self-pay | Admitting: Gastroenterology

## 2018-04-06 ENCOUNTER — Other Ambulatory Visit: Payer: Self-pay | Admitting: Family Medicine

## 2018-04-06 DIAGNOSIS — L570 Actinic keratosis: Secondary | ICD-10-CM | POA: Diagnosis not present

## 2018-04-06 NOTE — Telephone Encounter (Signed)
Pt must sched OV first/advised at last fill req that needed to be seen in May/Last OV was Nov 2018/thx dmf

## 2018-05-08 ENCOUNTER — Telehealth: Payer: Self-pay | Admitting: Pulmonary Disease

## 2018-05-08 NOTE — Telephone Encounter (Signed)
Pt states he is coughing up white phlegm x 2 weeks. - for PND - for fever/chills/body aches. + SOB with exertion as well.  Pt currently not on any allergy medication or nasal sprays. Pt has apt on Wednesday.   Dr. Halford Chessman please advise as RA is out of the office.

## 2018-05-08 NOTE — Telephone Encounter (Signed)
Spoke with patient-aware of recs from VS-pt has apt Wednesday with RA and aware that if he feels it his allergies then he could speak with his pharmacy and discuss what medication he could take over the weekend that is safe with is current medications. Nothing more needed at this time.

## 2018-05-08 NOTE — Telephone Encounter (Signed)
This is a new problem that hasn't been addressed by Dr. Elsworth Soho before.  He needs to come in for a visit.

## 2018-05-13 ENCOUNTER — Ambulatory Visit (INDEPENDENT_AMBULATORY_CARE_PROVIDER_SITE_OTHER)
Admission: RE | Admit: 2018-05-13 | Discharge: 2018-05-13 | Disposition: A | Discharge status: Home / Self Care | Payer: Medicare Other | Source: Ambulatory Visit | Attending: Pulmonary Disease | Admitting: Pulmonary Disease

## 2018-05-13 ENCOUNTER — Encounter: Payer: Self-pay | Admitting: Pulmonary Disease

## 2018-05-13 ENCOUNTER — Ambulatory Visit: Payer: Medicare Other | Admitting: Pulmonary Disease

## 2018-05-13 VITALS — BP 134/68 | HR 58 | Ht 71.5 in | Wt 198.0 lb

## 2018-05-13 DIAGNOSIS — J90 Pleural effusion, not elsewhere classified: Secondary | ICD-10-CM | POA: Diagnosis not present

## 2018-05-13 DIAGNOSIS — Z23 Encounter for immunization: Secondary | ICD-10-CM | POA: Diagnosis not present

## 2018-05-13 DIAGNOSIS — G4700 Insomnia, unspecified: Secondary | ICD-10-CM | POA: Diagnosis not present

## 2018-05-13 NOTE — Progress Notes (Signed)
   Subjective:    Patient ID: [name], male    DOB: [birth year], 77 y.o.   MRN: [id number]  HPI  77 yo for FU forleftpleural effusion  11/2017  Past medical history of CKD stage III, hypertension, atrial fibrillation status post ablation  Thoracentesis showed a neutrophilic exudate.   Chief Complaint  Patient presents with  . Follow-up    3 month rov.  c/o sob, chest pressure worse with exertion and hot temp.  also notes pnd, prod cough with frothy white mucus.     His last follow-up x-ray showed small left effusion versus pleural thickening. He reports persistent mild dyspnea especially on reading aloud for a long time or on walking stairs. He also reports occasional frothy clear sputum. Their son is stationed in [place] and they are going to travel in October  Also reports loud snoring and would like to check him out for sleep disordered breathing.  Bedtime of 11 PM and woke up around 5 AM.  Now after retirement wakes up around 7 AM but has restless sleep and is up every couple of hours.  Chest x-ray shows persistent blunting of left costophrenic angle. Spirometry shows moderate restriction with ratio of 72 and FEV1 and FVC of 58%   Significant tests/ events reviewed  CT angio  chest November 20, 2017>negative for PE, medium sized left pleural effusion with associated atelectasis Left thoracentesis November 22, 2017 with 480 cc of bloody fluid removed,cytology negative for malignant cells,pleural fluid culture negative  Review of Systems neg for any significant sore throat, dysphagia, itching, sneezing, nasal congestion or excess/ purulent secretions, fever, chills, sweats, unintended wt loss, pleuritic or exertional cp, hempoptysis, orthopnea pnd or change in chronic leg swelling. Also denies presyncope, palpitations, heartburn, abdominal pain, nausea, vomiting, diarrhea or change in bowel or urinary habits, dysuria,hematuria, rash, arthralgias, visual complaints, headache,  numbness weakness or ataxia.     Objective:   Physical Exam  Gen. Pleasant, well-nourished, in no distress ENT - no thrush, no post nasal drip Neck: No JVD, no thyromegaly, no carotid bruits Lungs: no use of accessory muscles, no dullness to percussion, decreased left flu shot today.  Your CT scan showed without rales or rhonchi  Cardiovascular: Rhythm regular, heart sounds  normal, no murmurs or gallops, no peripheral edema Musculoskeletal: No deformities, no cyanosis or clubbing        Assessment & Plan:

## 2018-05-13 NOTE — Assessment & Plan Note (Signed)
Given excessive daytime somnolence, narrow pharyngeal exam, witnessed apneas & loud snoring, obstructive sleep apnea is very likely & an overnight polysomnogram will be scheduled as a home study. The pathophysiology of obstructive sleep apnea , it's cardiovascular consequences & modes of treatment including CPAP were discused with the patient in detail & they evidenced understanding.  

## 2018-05-13 NOTE — Assessment & Plan Note (Signed)
Chest x-ray showed persistent thickening or fluid around left lung Lung function appears slightly decreased, restrictive pattern. Proceed with high-resolution CT of the chest, no contrast

## 2018-05-13 NOTE — Addendum Note (Signed)
Addended by: Len Blalock on: 05/13/2018 11:22 AM   Modules accepted: Orders

## 2018-05-13 NOTE — Patient Instructions (Addendum)
Chest x-ray showed persistent thickening or fluid around left lung Lung function appears slightly decreased, restrictive pattern. Proceed with high-resolution CT of the chest, no contrast Home sleep test

## 2018-05-18 ENCOUNTER — Ambulatory Visit (INDEPENDENT_AMBULATORY_CARE_PROVIDER_SITE_OTHER)
Admission: RE | Admit: 2018-05-18 | Discharge: 2018-05-18 | Disposition: A | Discharge status: Home / Self Care | Payer: Medicare Other | Source: Ambulatory Visit | Attending: Pulmonary Disease | Admitting: Pulmonary Disease

## 2018-05-18 DIAGNOSIS — J90 Pleural effusion, not elsewhere classified: Secondary | ICD-10-CM

## 2018-05-19 ENCOUNTER — Other Ambulatory Visit: Payer: Self-pay | Admitting: Family Medicine

## 2018-05-21 ENCOUNTER — Telehealth: Payer: Self-pay | Admitting: Pulmonary Disease

## 2018-05-21 NOTE — Telephone Encounter (Signed)
Notes recorded by Rigoberto Noel, MD on 05/19/2018 at 3:27 PM EDT Alternatively, we could do a PET scan in 3 months to take another look at this pleural thickening ------  Notes recorded by Rigoberto Noel, MD on 05/19/2018 at 3:04 PM EDT The pleural thickening is sequelae of the episode of pleurisy that he had. No way to undo this. If there were concern for cancer we would do a biopsy, pleural fluid results were negative for cancer.   Attempted to call pt to relay the above information from previous CT but unable to reach pt. Left message for pt to return call x1.

## 2018-05-22 DIAGNOSIS — L812 Freckles: Secondary | ICD-10-CM | POA: Diagnosis not present

## 2018-05-22 DIAGNOSIS — L821 Other seborrheic keratosis: Secondary | ICD-10-CM | POA: Diagnosis not present

## 2018-05-22 DIAGNOSIS — L570 Actinic keratosis: Secondary | ICD-10-CM | POA: Diagnosis not present

## 2018-05-22 DIAGNOSIS — Z85828 Personal history of other malignant neoplasm of skin: Secondary | ICD-10-CM | POA: Diagnosis not present

## 2018-05-22 DIAGNOSIS — Z85820 Personal history of malignant melanoma of skin: Secondary | ICD-10-CM | POA: Diagnosis not present

## 2018-05-22 MED ORDER — ALBUTEROL SULFATE HFA 108 (90 BASE) MCG/ACT IN AERS: 2.0000 | INHALATION_SPRAY | Freq: Four times a day (QID) | RESPIRATORY_TRACT | 6 refills | Status: DC | PRN

## 2018-05-22 NOTE — Telephone Encounter (Signed)
Called and spoke with pt stating to him to do albuterol inhaler as needed for the shortness of breath, wheezing, chest tightness.   Pt stated to me he did not have an albuterol inhaler. Rx sent to pt's pharmacy for pt. Nothing further needed.

## 2018-05-22 NOTE — Telephone Encounter (Signed)
Albuterol 2 puffs every 6 hours as needed for shortness of breath/wheezing -as we discussed

## 2018-05-22 NOTE — Telephone Encounter (Signed)
Called and spoke to patient, made aware of results. Voiced understanding. Patient states he was told he had Chronic Bronchitis and would like to know how RA will be treating this and what can he do regarding his SOB with exertion, cough with white phlegm, and his chest feeling heavy on deep inhalations which the patient states is all his baseline.   RA please advise.

## 2018-06-22 DIAGNOSIS — R3915 Urgency of urination: Secondary | ICD-10-CM | POA: Diagnosis not present

## 2018-06-22 DIAGNOSIS — G4710 Hypersomnia, unspecified: Secondary | ICD-10-CM | POA: Diagnosis not present

## 2018-06-23 DIAGNOSIS — G4710 Hypersomnia, unspecified: Secondary | ICD-10-CM | POA: Diagnosis not present

## 2018-06-24 ENCOUNTER — Other Ambulatory Visit: Payer: Self-pay | Admitting: *Deleted

## 2018-06-24 DIAGNOSIS — G4700 Insomnia, unspecified: Secondary | ICD-10-CM

## 2018-06-25 ENCOUNTER — Telehealth: Payer: Self-pay | Admitting: Pulmonary Disease

## 2018-06-25 NOTE — Telephone Encounter (Signed)
Spoke with patient's wife Earlie Server, she is aware of the results and will relay the information to the patient when he returns home. Patient currently has an appt scheduled to see RA on 12/19. Per Earlie Server, he is still having problems with not getting enough sleep at night. She wants to know if there is anything else he can do in order to produce sleep.   RA, please advise. Thanks!

## 2018-06-25 NOTE — Telephone Encounter (Signed)
Over-the-counter melatonin. We may have discussed this during office visit and I forget he may already have tried this

## 2018-06-25 NOTE — Telephone Encounter (Signed)
Per RA, HST does not show any signs of OSA. Does not recommend treatment at this time.

## 2018-07-27 ENCOUNTER — Ambulatory Visit: Payer: Self-pay

## 2018-07-27 ENCOUNTER — Encounter: Payer: Self-pay | Admitting: Pulmonary Disease

## 2018-07-27 ENCOUNTER — Ambulatory Visit (INDEPENDENT_AMBULATORY_CARE_PROVIDER_SITE_OTHER): Payer: Medicare Other | Admitting: Pulmonary Disease

## 2018-07-27 ENCOUNTER — Encounter: Payer: Medicare Other | Admitting: Family Medicine

## 2018-07-27 DIAGNOSIS — J90 Pleural effusion, not elsewhere classified: Secondary | ICD-10-CM | POA: Diagnosis not present

## 2018-07-27 DIAGNOSIS — I10 Essential (primary) hypertension: Secondary | ICD-10-CM

## 2018-07-27 NOTE — Progress Notes (Signed)
   Subjective:    Patient ID: [name], male    DOB: [birth year], 77 y.o.   MRN: [id number]  HPI  77 yo for FU forleftpleural effusion  11/2017  Past medical history of CKD stage III, hypertension, atrial fibrillation status post ablation  Thoracentesis showed a neutrophilic exudate.  Chief Complaint  Patient presents with  . Follow-up    pt states that he is SOB, and chest tightness feels that it is getting worse. Pt states that when talking he starts coughing that is productive   He had persistent dyspnea for the last 3 months.  They were able to make the trip to [place] and able to go to different sites.  He has bouts of coughing when he takes a deep breath and he feels like this is getting worse.  Wife feels he has lost a lot of weight, objectively he seems to have lost 9 pounds over the past 7 months, his weight on his last visit 04/2018 was 198 pounds and today is 192 pounds with clothes on. Reports feeling of some grittiness in his mouth and lack of taste  Cough is productive of minimal white sputum he denies fevers or chest pain  Personally reviewed last CT scan and Chest x-ray shows persistent blunting of left costophrenic angle.  Obtain detailed environmental history, he worked in [employer] all his life , he worked for a week clearing asbestos  Significant tests/ events reviewed  Spirometry 04/2018  shows moderate restriction with ratio of 72 and FEV1 and FVC of 58%  CT chest 04/2018 Small residual left pleural effusion is identified. Diffuse left-sided pleural thickening is noted likely of a reactive nature  CTangiochest November 20, 2017>negative for PE, medium sized left pleural effusion with associated atelectasis Left thoracentesis November 22, 2017 with 480 cc of bloody fluid removed,cytology negative for malignant cells,pleural fluid culture negative  HST 06/2018 neg    Past Medical History:  Diagnosis Date  . Atrial flutter ([place]) 03-2012  .  Chronic back pain   . Headache(784.0)    migraines x 3 in life  . Hearing loss   . Hyperlipidemia   . Hypertension   . HYPOGONADISM, MALE 10/12/2007   Followup per urology    . Melanoma ([place])    melanoma left forearm 2005- no chemo  . PROSTATE CANCER, HX OF 10/06/2008   Status post surgery approximately 2010      Review of Systems neg for any significant sore throat, dysphagia, itching, sneezing, nasal congestion or excess/ purulent secretions, fever, chills, sweats, unintended wt loss, pleuritic or exertional cp, hempoptysis, orthopnea pnd or change in chronic leg swelling. Also denies presyncope, palpitations, heartburn, abdominal pain, nausea, vomiting, diarrhea or change in bowel or urinary habits, dysuria,hematuria, rash, arthralgias, visual complaints, headache, numbness weakness or ataxia.     Objective:   Physical Exam   Gen. Pleasant, well-nourished, in no distress ENT - no thrush, no post nasal drip Neck: No JVD, no thyromegaly, no carotid bruits Lungs: no use of accessory muscles, no dullness to percussion, decreased left without rales or rhonchi  Cardiovascular: Rhythm regular, heart sounds  normal, no murmurs or gallops, no peripheral edema Musculoskeletal: No deformities, no cyanosis or clubbing         Assessment & Plan:

## 2018-07-27 NOTE — Telephone Encounter (Signed)
Per TA call Pulmonology for ASAP OV/Called and is now scheduled to see Dr. Elsworth Soho @ 3:30 but will arrive at 3:15 today/also confirmed that is aware of new address of [address], [place]/thx dmf

## 2018-07-27 NOTE — Patient Instructions (Signed)
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

## 2018-07-27 NOTE — Telephone Encounter (Signed)
Ret'd call to pt.  Reported has been short of breath since April, when he had to have a pint of fluid drained from his left lung.  Stated the shortness of breath has increased over past 2 weeks.  Reported he has intermittent episodes when speaking that he has to catch his breath.  Noted that pt. was able to answer assessment questions in detail, but noted he slowed down to talk x 2, during the questions.  Stated he is able to lay flat.  Reported he recently traveled to [place], and returned  October 28th; noted about 10 days after his trip, he had increased SOB.  Stated he actually felt better in [place], and able to tolerate a lot of walking.  Stated the only time he struggled was when he had to climb steep inclines.  Reported it is hard to take a deep breath.  Stated he has been "spitting up frothy white phlegm, multiple times / day."  Stated this has been present since his problem started back in April.  Stated he has feeling of being cold at intervals.  Reported has chest discomfort at intervals, "like someone is poking me with a finger."  Denied any chest discomfort at this time.  Pt. Also verb. Concern about weight loss of 35 # since April; denied intentional weight loss.  Stated he is to f/u with his Pulmonologist on 12/19, but requested appt. With PCP.  Appt. given today at 3:00 PM with PCP.  Advised this appt. Will be focused on his shortness of breath.  Care advice given per protocol.  Pt. stated he will not be able to rest today, leading up to appt., due to work responsibilities.  Advised to go to ER if has any worsening of his breathing, or any chest pain.  Verb. Understanding.          Reason for Disposition . [1] MILD difficulty breathing (e.g., minimal/no SOB at rest, SOB with walking, pulse <100) AND [2] NEW-onset or WORSE than normal    Pt. reported has had shortness of breath since April.  Stated had fluid on left lung at that time.  Reported his shortness of breath is intermittent and has  increased over past 2 weeks.  Able to speak in detail with assessment questions, but stated he has to catch his breath at times when trying to speak.  Stated his SOB is moderate to severe at times.  Answer Assessment - Initial Assessment Questions 1. RESPIRATORY STATUS: "Describe your breathing?" (e.g., wheezing, shortness of breath, unable to speak, severe coughing)      Increased shortness of breath in speaking 2. ONSET: "When did this breathing problem begin?"      About 2 weeks ago  3. PATTERN "Does the difficult breathing come and go, or has it been constant since it started?"      Comes and goes  4. SEVERITY: "How bad is your breathing?" (e.g., mild, moderate, severe)    - MILD: No SOB at rest, mild SOB with walking, speaks normally in sentences, can lay down, no retractions, pulse < 100.    - MODERATE: SOB at rest, SOB with minimal exertion and prefers to sit, cannot lie down flat, speaks in phrases, mild retractions, audible wheezing, pulse 100-120.    - SEVERE: Very SOB at rest, speaks in single words, struggling to breathe, sitting hunched forward, retractions, pulse > 120      Moderate and severe at times - stated he speaks in phrases intermittently; SOB with climbing  stairs ;  5. RECURRENT SYMPTOM: "Have you had difficulty breathing before?" If so, ask: "When was the last time?" and "What happened that time?"      Yes, had fluid in (L) lung in April; had this drained; followed by Pulmonolgy.    6. CARDIAC HISTORY: "Do you have any history of heart disease?" (e.g., heart attack, angina, bypass surgery, angioplasty)      A-Fib 7. LUNG HISTORY: "Do you have any history of lung disease?"  (e.g., pulmonary embolus, asthma, emphysema)     Hx of fluid in left lung in April 8. CAUSE: "What do you think is causing the breathing problem?"      unknown 9. OTHER SYMPTOMS: "Do you have any other symptoms? (e.g., dizziness, runny nose, cough, chest pain, fever)     "sometimes feels like someone  is poking him with a finger"; (L) ant. Chest; denied feeling of chest discomfort at present; cannot breathe deep; spitting up frothy white phlegm at intervals/ multiple times per day; stated "has cold feeling every once in awhile"    10. PREGNANCY: "Is there any chance you are pregnant?" "When was your last menstrual period?"       N/a 11. TRAVEL: "Have you traveled out of the country in the last month?" (e.g., travel history, exposures)       In [place] ; ret'd Oct. 28 th; noted increased shortness of breath about 10 days after returning. Reported felt better while in [place], and able to tolerate a lot of walking at that time. Does report some shortness of breath with going up steep incline.  Protocols used: BREATHING DIFFICULTY-A-AH Message from Lennox Solders sent at 07/27/2018 8:57 AM EST   Summary: please advise   Pt is calling and he is having sob . This sob started in April 2019. Sob has gotten worst. Pt does have an appt to see his pulmonologist on 08-06-18. Pt has been dieting and now has stopped. Pt is concerning because he is still losing weight

## 2018-07-27 NOTE — Progress Notes (Signed)
Per TA call Pulmonology for ASAP OV/Called and is now scheduled to see Dr. Elsworth Soho @ 3:30 but will arrive at 3:15 today/also confirmed that is aware of new address of [address], [place]/thx dmf

## 2018-07-28 ENCOUNTER — Telehealth: Payer: Self-pay | Admitting: Pulmonary Disease

## 2018-07-28 NOTE — Telephone Encounter (Signed)
On review of his medications, benazepril can also cause a cough. Would advise him to stop this and substitute with Benicar 40 which is another medication in the same family which would not cause cough

## 2018-07-28 NOTE — Assessment & Plan Note (Signed)
I wonder if he could have ACE inhibitor induced cough.  We will advise him to stop benazepril and change to olmesartan instead

## 2018-07-28 NOTE — Assessment & Plan Note (Signed)
Cytology on left effusion was negative for malignancy.  CT scan shows pleural thickening and encasing of entire left lung and a thick pleural peel suggestive of fibrothorax.  Lung function shows restriction.  I wonder if he has some degree of trapped lung due to fibrothorax, alternative diagnoses would be mesothelioma but he does not seem to have a history of significant exposure to asbestos  We will proceed with a PET scan, if pleural lights up then he will need pleural biopsy.  If not then we may still have to consider surgical opinion

## 2018-07-29 ENCOUNTER — Ambulatory Visit: Payer: Self-pay

## 2018-07-29 ENCOUNTER — Telehealth: Payer: Self-pay

## 2018-07-29 NOTE — Telephone Encounter (Signed)
Copied from [place] [id number]. Topic: General - Other >> Jul 29, 2018  1:56 PM Antonieta Iba C wrote: Reason for CRM: pt is calling in to update the office with the details of his pulmonary visit. Pt says that he was asked to call back by the nurse one he completes his visit.   CB: [phone]

## 2018-07-29 NOTE — Telephone Encounter (Signed)
I spoke to pt/he advised me that Pulmonologist has ordered a PET scan and was grateful that we helped him/he will call back to update next week/I spoke to Dr. Deborra Medina and advised/thx dmf

## 2018-07-31 NOTE — Telephone Encounter (Signed)
Left message for patient to call back  

## 2018-08-06 ENCOUNTER — Ambulatory Visit: Payer: Medicare Other | Admitting: Pulmonary Disease

## 2018-08-07 ENCOUNTER — Ambulatory Visit (HOSPITAL_COMMUNITY)
Admission: RE | Admit: 2018-08-07 | Discharge: 2018-08-07 | Disposition: A | Discharge status: Home / Self Care | Payer: Medicare Other | Source: Ambulatory Visit | Attending: Pulmonary Disease | Admitting: Pulmonary Disease

## 2018-08-07 DIAGNOSIS — J90 Pleural effusion, not elsewhere classified: Secondary | ICD-10-CM | POA: Insufficient documentation

## 2018-08-07 DIAGNOSIS — J929 Pleural plaque without asbestos: Secondary | ICD-10-CM | POA: Diagnosis not present

## 2018-08-07 LAB — GLUCOSE, CAPILLARY: GLUCOSE-CAPILLARY: 107 mg/dL — AB (ref 70–99)

## 2018-08-07 MED ORDER — FLUDEOXYGLUCOSE F - 18 (FDG) INJECTION
9.1600 | Freq: Once | INTRAVENOUS | Status: AC | PRN
  Administered 2018-08-07: 9.16 via INTRAVENOUS

## 2018-08-10 ENCOUNTER — Telehealth: Payer: Self-pay | Admitting: Pulmonary Disease

## 2018-08-10 DIAGNOSIS — J90 Pleural effusion, not elsewhere classified: Secondary | ICD-10-CM

## 2018-08-10 NOTE — Telephone Encounter (Signed)
Order has been placed. Will close this message.

## 2018-08-10 NOTE — Telephone Encounter (Signed)
Discussed PET scan results with wife, patient was out. Explained pleural thickening could represent malignancy or residual inflammation with lung trapping.  Will need CT-guided pleural biopsy ASAP. Please schedule

## 2018-08-24 ENCOUNTER — Other Ambulatory Visit: Payer: Self-pay | Admitting: Radiology

## 2018-08-25 ENCOUNTER — Ambulatory Visit (HOSPITAL_COMMUNITY)
Admission: RE | Admit: 2018-08-25 | Discharge: 2018-08-25 | Disposition: A | Discharge status: Home / Self Care | Payer: Medicare Other | Source: Ambulatory Visit | Attending: Pulmonary Disease | Admitting: Pulmonary Disease

## 2018-08-25 ENCOUNTER — Encounter (HOSPITAL_COMMUNITY): Payer: Self-pay

## 2018-08-25 ENCOUNTER — Other Ambulatory Visit: Payer: Self-pay

## 2018-08-25 ENCOUNTER — Ambulatory Visit (HOSPITAL_COMMUNITY)
Admission: RE | Admit: 2018-08-25 | Discharge: 2018-08-25 | Disposition: A | Discharge status: Home / Self Care | Payer: Medicare Other | Source: Ambulatory Visit | Attending: Interventional Radiology | Admitting: Interventional Radiology

## 2018-08-25 DIAGNOSIS — C450 Mesothelioma of pleura: Secondary | ICD-10-CM | POA: Diagnosis not present

## 2018-08-25 DIAGNOSIS — E785 Hyperlipidemia, unspecified: Secondary | ICD-10-CM | POA: Insufficient documentation

## 2018-08-25 DIAGNOSIS — N183 Chronic kidney disease, stage 3 (moderate): Secondary | ICD-10-CM | POA: Insufficient documentation

## 2018-08-25 DIAGNOSIS — J90 Pleural effusion, not elsewhere classified: Secondary | ICD-10-CM

## 2018-08-25 DIAGNOSIS — I129 Hypertensive chronic kidney disease with stage 1 through stage 4 chronic kidney disease, or unspecified chronic kidney disease: Secondary | ICD-10-CM | POA: Insufficient documentation

## 2018-08-25 DIAGNOSIS — Z87891 Personal history of nicotine dependence: Secondary | ICD-10-CM | POA: Insufficient documentation

## 2018-08-25 DIAGNOSIS — Z800 Family history of malignant neoplasm of digestive organs: Secondary | ICD-10-CM | POA: Insufficient documentation

## 2018-08-25 DIAGNOSIS — Z7901 Long term (current) use of anticoagulants: Secondary | ICD-10-CM | POA: Insufficient documentation

## 2018-08-25 DIAGNOSIS — J95811 Postprocedural pneumothorax: Secondary | ICD-10-CM

## 2018-08-25 DIAGNOSIS — J9809 Other diseases of bronchus, not elsewhere classified: Secondary | ICD-10-CM | POA: Diagnosis not present

## 2018-08-25 DIAGNOSIS — Z8546 Personal history of malignant neoplasm of prostate: Secondary | ICD-10-CM | POA: Insufficient documentation

## 2018-08-25 DIAGNOSIS — R918 Other nonspecific abnormal finding of lung field: Secondary | ICD-10-CM | POA: Diagnosis not present

## 2018-08-25 DIAGNOSIS — Z85820 Personal history of malignant melanoma of skin: Secondary | ICD-10-CM | POA: Insufficient documentation

## 2018-08-25 DIAGNOSIS — Z79899 Other long term (current) drug therapy: Secondary | ICD-10-CM | POA: Diagnosis not present

## 2018-08-25 DIAGNOSIS — J948 Other specified pleural conditions: Secondary | ICD-10-CM | POA: Diagnosis not present

## 2018-08-25 LAB — CBC WITH DIFFERENTIAL/PLATELET
Abs Immature Granulocytes: 0.03 10*3/uL (ref 0.00–0.07)
Basophils Absolute: 0.1 10*3/uL (ref 0.0–0.1)
Basophils Relative: 1 %
EOS ABS: 0.1 10*3/uL (ref 0.0–0.5)
EOS PCT: 2 %
HCT: 43.5 % (ref 39.0–52.0)
Hemoglobin: 14.4 g/dL (ref 13.0–17.0)
Immature Granulocytes: 0 %
Lymphocytes Relative: 23 %
Lymphs Abs: 1.6 10*3/uL (ref 0.7–4.0)
MCH: 28.9 pg (ref 26.0–34.0)
MCHC: 33.1 g/dL (ref 30.0–36.0)
MCV: 87.3 fL (ref 80.0–100.0)
Monocytes Absolute: 0.7 10*3/uL (ref 0.1–1.0)
Monocytes Relative: 10 %
Neutro Abs: 4.5 10*3/uL (ref 1.7–7.7)
Neutrophils Relative %: 64 %
PLATELETS: 322 10*3/uL (ref 150–400)
RBC: 4.98 MIL/uL (ref 4.22–5.81)
RDW: 13.2 % (ref 11.5–15.5)
WBC: 6.9 10*3/uL (ref 4.0–10.5)
nRBC: 0 % (ref 0.0–0.2)

## 2018-08-25 LAB — COMPREHENSIVE METABOLIC PANEL
ALT: 9 U/L (ref 0–44)
ANION GAP: 8 (ref 5–15)
AST: 31 U/L (ref 15–41)
Albumin: 3.1 g/dL — ABNORMAL LOW (ref 3.5–5.0)
Alkaline Phosphatase: 42 U/L (ref 38–126)
BUN: 13 mg/dL (ref 8–23)
CO2: 21 mmol/L — ABNORMAL LOW (ref 22–32)
Calcium: 8.9 mg/dL (ref 8.9–10.3)
Chloride: 109 mmol/L (ref 98–111)
Creatinine, Ser: 1.17 mg/dL (ref 0.61–1.24)
GFR calc Af Amer: >60 mL/min (ref >60)
GFR calc non Af Amer: 60 mL/min — ABNORMAL LOW (ref >60)
Glucose, Bld: 107 mg/dL — ABNORMAL HIGH (ref 70–99)
Potassium: 4.8 mmol/L (ref 3.5–5.1)
Sodium: 138 mmol/L (ref 135–145)
Total Bilirubin: 1.3 mg/dL — ABNORMAL HIGH (ref 0.3–1.2)
Total Protein: 6.6 g/dL (ref 6.5–8.1)

## 2018-08-25 LAB — PROTIME-INR
INR: 1.05
Prothrombin Time: 13.6 seconds (ref 11.4–15.2)

## 2018-08-25 MED ORDER — SODIUM CHLORIDE 0.9 % IV SOLN
INTRAVENOUS | Status: DC
  Administered 2018-08-25: 10:00:00 via INTRAVENOUS

## 2018-08-25 MED ORDER — LIDOCAINE HCL 1 % IJ SOLN
INTRAMUSCULAR | Status: AC
  Filled 2018-08-25: qty 20

## 2018-08-25 MED ORDER — FENTANYL CITRATE (PF) 100 MCG/2ML IJ SOLN
INTRAMUSCULAR | Status: DC | PRN
  Administered 2018-08-25: 25 ug via INTRAVENOUS
  Administered 2018-08-25: 50 ug via INTRAVENOUS

## 2018-08-25 MED ORDER — MIDAZOLAM HCL 2 MG/2ML IJ SOLN
INTRAMUSCULAR | Status: DC | PRN
  Administered 2018-08-25: 1 mg via INTRAVENOUS
  Administered 2018-08-25: 0.5 mg via INTRAVENOUS

## 2018-08-25 MED ORDER — FENTANYL CITRATE (PF) 100 MCG/2ML IJ SOLN
INTRAMUSCULAR | Status: AC
  Filled 2018-08-25: qty 6

## 2018-08-25 MED ORDER — NALOXONE HCL 0.4 MG/ML IJ SOLN
INTRAMUSCULAR | Status: AC
  Filled 2018-08-25: qty 1

## 2018-08-25 MED ORDER — MIDAZOLAM HCL 2 MG/2ML IJ SOLN
INTRAMUSCULAR | Status: AC
  Filled 2018-08-25: qty 6

## 2018-08-25 MED ORDER — FLUMAZENIL 0.5 MG/5ML IV SOLN
INTRAVENOUS | Status: AC
  Filled 2018-08-25: qty 5

## 2018-08-25 NOTE — H&P (Signed)
Chief Complaint: Patient was seen in consultation today for left pleural mass biopsy.   Referring Physician(s): Rigoberto Noel  Supervising Physician: Corrie Mckusick  Patient Status: [facility] - Out-pt  History of Present Illness: [name] is a 78 y.o. male with a past medical history significant for HLD, HTN, atrial fibrillation s/p ablation, CKD III and history of prostate cancer s/p prostatectomy who presents to IR today for biopsy of left pleural mass. Patient is currently followed by Dr. Elsworth Soho for continued dyspnea, chest pain with exertion and productive cough with frothy white mucus which has been ongoing since April 2019. He was admitted to [facility] on 11/20/17 after presenting to the ED due to complaints of chest pain and dyspnea; CXR showed PNA as well as small left pleural effusion for which he underwent thoracentesis on 11/22/17 - cytology of pleural fluid was negative for malignancy. He was discharged on 11/24/17 however he continued to have worsening dyspnea on follow up with Dr. Elsworth Soho - CT chest w/o contrast was performed on 05/18/18 which showed small residual left pleural effusion as well as diffuse left-sided pleural thickening, increased from prior exam, likely reactive in nature. 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 Request has been made to IR for pleural biopsy.  Patient reports that he continues to have dyspnea, sometimes at rest but most often associated with exertion and talking for long periods of time. He reports left sided chest pain that "feels like a finger is poking me right in the chest" and has not noted to be associated with exertion, eating,  breathing or position - he states the pain lasts for a few seconds before resolving. He also reports concern about "thickening of the skin around my heart" as well as concern that fluid has accumulated in his lung again and that is "making my heart stop working right." He expresses concern regarding his atrial fibrillation and use of Eliquis due to side effects - he has follow up with cardiology scheduled to discuss this. He reports intermittent cough that is sometimes productive of clear sputum, however he is mostly concerned with daily "clear spittle" production that he would like a sample taken of for testing. He is concerned about the size of the thickening in his lungs and would like "an exact measurement from someone." His wife is also concerned that he has lost "a ton" of weight unintentionally, however his appetite has remained the same.   Past Medical History:  Diagnosis Date  . Atrial flutter ([place]) 03-2012  . Chronic back pain   . Headache(784.0)    migraines x 3 in life  . Hearing loss   . Hyperlipidemia   . Hypertension   . HYPOGONADISM, MALE 10/12/2007   Followup per urology    . Melanoma ([place])    melanoma left forearm 2005- no chemo  . PROSTATE CANCER, HX OF 10/06/2008   Status post surgery approximately 2010      Past Surgical History:  Procedure Laterality Date  . ABLATION  01-24-2014   repeat CTI ablation by Dr Lovena Le  . ABLATION OF DYSRHYTHMIC FOCUS  10/15/2012   CTI ablation by Dr Lovena Le for atrial flutter  . arhtroscopy  right knee  2010  . ATRIAL FLUTTER ABLATION N/A 10/15/2012  Procedure: ATRIAL FLUTTER ABLATION;  Surgeon: Evans Lance, MD;  Location: [facility] CATH LAB;  Service: Cardiovascular;  Laterality: N/A;  . ATRIAL FLUTTER ABLATION N/A 01/24/2014   Procedure: ATRIAL FLUTTER ABLATION;  Surgeon: Evans Lance, MD;  Location: [facility] CATH LAB;  Service: Cardiovascular;  Laterality: N/A;  . BACK SURGERY  07/2006   lumbar discectomy  . CARDIAC CATHETERIZATION     15 years  ago  . CARDIOVERSION  05/18/2012   Procedure: CARDIOVERSION;  Surgeon: Peter M Martinique, MD;  Location: [facility] ENDOSCOPY;  Service: Cardiovascular;  Laterality: N/A;  on eloquis on-going  . COLONOSCOPY W/ POLYPECTOMY    . LUMBAR LAMINECTOMY/DECOMPRESSION MICRODISCECTOMY  08/16/2011   Procedure: LUMBAR LAMINECTOMY/DECOMPRESSION MICRODISCECTOMY;  Surgeon: Dahlia Bailiff;  Location: [facility] ORS;  Service: Orthopedics;  Laterality: N/A;  Lumbar Decompression and Foraminotomy L4-S1    . MELANOMA EXCISION  12/13/03   [place] [facility]  . PROSTATECTOMY  07/2008  . ROTATOR CUFF REPAIR  2008   right; Dr Justice Britain  . TONSILLECTOMY  1947    Allergies: Eucalyptol and Statins  Medications: Prior to Admission medications   Medication Sig Start Date End Date Taking? Authorizing Provider  albuterol (PROVENTIL HFA;VENTOLIN HFA) 108 (90 Base) MCG/ACT inhaler Inhale 2 puffs into the lungs every 6 (six) hours as needed for wheezing or shortness of breath. 05/22/18  Yes Rigoberto Noel, MD  apixaban (ELIQUIS) 5 MG TABS tablet Take 1 tablet (5 mg total) by mouth 2 (two) times daily. 12/26/17  Yes Evans Lance, MD  benazepril (LOTENSIN) 40 MG tablet Take 1qd; must schedule OV for future fills Patient taking differently: Take 40 mg by mouth daily. Take 1qd; must schedule OV for future fills 05/19/18  Yes Lucille Passy, MD  fenofibrate 160 MG tablet Take 1qd; must schedule OV for future fills Patient taking differently: Take 160 mg by mouth daily. Take 1qd; must schedule OV for future fills 05/19/18  Yes Lucille Passy, MD  oxybutynin (DITROPAN-XL) 10 MG 24 hr tablet Take 10 mg by mouth daily. 11/07/17  Yes [provider]  verapamil (CALAN-SR) 240 MG CR tablet TAKE 1 TABLET BY MOUTH AT  BEDTIME 03/05/18  Yes Lucille Passy, MD  pravastatin (PRAVACHOL) 20 MG tablet Take 1 tablet (20 mg total) by mouth daily. 10/10/11 11/12/11  Colon Branch, MD     Family History  Problem Relation Age of Onset  . Pancreatic cancer  Mother   . Colon cancer Brother   . Esophageal cancer Neg Hx   . Stomach cancer Neg Hx   . Rectal cancer Neg Hx     Social History   Socioeconomic History  . Marital status: Married    Spouse name: Not on file  . Number of children: 2  . Years of education: Not on file  . Highest education level: Not on file  Occupational History  . Occupation: Film/video editor  Social Needs  . Financial resource strain: Not on file  . Food insecurity:    Worry: Not on file    Inability: Not on file  . Transportation needs:    Medical: Not on file    Non-medical: Not on file  Tobacco Use  . Smoking status: Former Smoker    Packs/day: 1.00    Years: 20.00    Pack years: 20.00    Types: Cigarettes    Last attempt to quit: 08/08/1984    Years since quitting: 34.0  . Smokeless tobacco: Never Used  Substance and Sexual Activity  . Alcohol use: Yes    Alcohol/week: 2.0 standard drinks    Types: 2 Cans of beer per week    Comment: socially   2 beer or mixed drinks week  . Drug use: No  . Sexual activity: Never  Lifestyle  . Physical activity:    Days per week: Not on file    Minutes per session: Not on file  . Stress: Not on file  Relationships  . Social connections:    Talks on phone: Not on file    Gets together: Not on file    Attends religious service: Not on file    Active member of club or organization: Not on file    Attends meetings of clubs or organizations: Not on file    Relationship status: Not on file  Other Topics Concern  . Not on file  Social History Narrative   in [place] since '84   Desires CPR     Review of Systems: A 12 point ROS discussed and pertinent positives are indicated in the HPI above.  All other systems are negative.  Review of Systems  Constitutional: Positive for unexpected weight change. Negative for appetite change, chills and fever.  Respiratory: Positive for cough (intermittent; productive of clear sputum), chest tightness (with  exertion) and shortness of breath (worse with exertion and talking). Negative for wheezing.   Cardiovascular: Positive for chest pain ("like someone is poking my chest" - none currently). Negative for palpitations and leg swelling.  Gastrointestinal: Negative for abdominal pain, blood in stool, diarrhea, nausea and vomiting.  Genitourinary: Negative for dysuria and hematuria.  Skin: Negative for rash.  Neurological: Negative for dizziness, syncope and headaches.  Psychiatric/Behavioral: Negative for confusion.    Vital Signs: BP 120/66   Pulse 72   Temp 97.8 F (36.6 C) (Oral)   Resp 16   Ht 6' (1.829 m)   Wt 185 lb (83.9 kg)   SpO2 97%   BMI 25.09 kg/m   Physical Exam Vitals signs reviewed.  Constitutional:      General: He is not in acute distress.    Comments: Wife at bedside during exam.  HENT:     Head: Normocephalic and atraumatic.  Cardiovascular:     Rate and Rhythm: Normal rate and regular rhythm.     Heart sounds: Murmur present.  Pulmonary:     Effort: No respiratory distress.     Breath sounds: No wheezing.     Comments: Decreased breath sounds bilaterally; poor inspiratory effort Chest:     Chest wall: No tenderness.  Abdominal:     Palpations: Abdomen is soft.     Tenderness: There is no abdominal tenderness.  Skin:    General: Skin is warm and dry.  Neurological:     Mental Status: He is alert and oriented to person, place, and time.  Psychiatric:        Mood and Affect: Mood normal.        Behavior: Behavior normal.        Thought Content: Thought content normal.        Judgment: Judgment normal.      MD Evaluation Airway: WNL Heart: WNL Abdomen: WNL Chest/ Lungs: WNL ASA  Classification: 3 Mallampati/Airway Score: Two   Imaging: Nm Pet Image Initial (pi) Skull Base To Thigh  Result Date: 08/07/2018 CLINICAL DATA:  Initial treatment strategy for left pleural thickening. No malignancy identified on thoracentesis 11/22/2017. Evaluate for  pleural based mass.  History of prostate cancer. EXAM: NUCLEAR MEDICINE PET SKULL BASE TO THIGH TECHNIQUE: 9.1 mCi F-18 FDG was injected intravenously. Full-ring PET imaging was performed from the skull base to thigh after the radiotracer. CT data was obtained and used for attenuation correction and anatomic localization. Fasting blood glucose: 107 mg/dl COMPARISON:  Chest CT 05/18/2018 and 11/20/2017. FINDINGS: Mediastinal blood pool activity: SUV max 1.3 NECK: No hypermetabolic cervical lymph nodes are identified.There are no lesions of the pharyngeal mucosal space. Incidental CT findings: The right maxillary sinus is opacified without associated hypermetabolic activity. CHEST: There is extensive nodular pleural thickening throughout the left hemithorax with associated hypermetabolic activity. The hypermetabolism is greatest medially and inferiorly (SUV max 14.9). This pleural thickening has mildly progressed from the most recent CT. There are several hypermetabolic and mildly enlarged mediastinal lymph nodes. 1.8 cm AP window node on image 71/4 has an SUV max of 10.9, and there is an 11 mm subcarinal node on image 83/4 which is mildly hypermetabolic (SUV max 5.5). 17 mm anterior left hilar node on image 52/7 is hypermetabolic as well. There are small hypermetabolic left internal mammary nodes, measuring up to 9 mm on image 103/4 (SUV max 9.0). Hypermetabolic activity extends along the left major fissure. There is no suspicious pulmonary or right pleural activity. Incidental CT findings: Stable left lower lobe atelectasis. No suspicious pulmonary nodules. Mild atherosclerosis of the aorta, great vessels and coronary arteries with aortic valvular calcifications. Bilateral gynecomastia. ABDOMEN/PELVIS: There is no hypermetabolic activity within the liver, adrenal glands, spleen or pancreas. There are small hypermetabolic lymph nodes in the upper abdomen, including a 17 mm celiac node on image 117/4 (SUV max 11.5) and  a left retrocrural node on image 117/4 (SUV max 9.0). Incidental CT findings: Possible small gallstones. Mild sigmoid diverticulosis and bladder wall thickening. Mild aortic and branch vessel atherosclerosis. SKELETON: There is no hypermetabolic activity to suggest osseous metastatic disease. Incidental CT findings: Postsurgical changes in the lower lumbar spine. Probable bone island in the right iliac bone without hypermetabolic activity. IMPRESSION: 1. Marked hypermetabolic activity associated with the extensive pleural thickening throughout the left hemithorax. There are associated mildly enlarged and hypermetabolic mediastinal, left internal mammary and upper abdominal lymph nodes. 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 Mesothelioma less likely. Pleural biopsy recommended. 2. No extranodal involvement identified in the abdomen or pelvis. 3. Possible cholelithiasis, mild sigmoid diverticulosis, bladder wall thickening and Aortic Atherosclerosis (ICD10-I70.0). Electronically Signed   By: Richardean Sale M.D.   On: 08/07/2018 10:48    Labs:  CBC: Recent Labs    11/21/17 0300 11/23/17 0247 11/27/17 1123 08/25/18 0918  WBC 8.0 8.9 6.9 6.9  HGB 14.1 14.2 14.3 14.4  HCT 42.3 41.9 42.6 43.5  PLT 217 253 370.0 322    COAGS: Recent Labs    08/25/18 0918  INR 1.05    BMP: Recent Labs    11/21/17 0300 11/22/17 0150 11/23/17 0247 11/27/17 1123 12/04/17 1033 08/25/18 0918  NA 137 138 137 136 139 138  K 3.8 3.6 3.9 4.5 5.1 4.8  CL 104 106 104 102 104 109  CO2 22 23 23 28 28  21*  GLUCOSE 127* 131* 128* 118* 88 107*  BUN 16 17 16  24* 14 13  CALCIUM 8.3* 8.6* 8.7* 9.0 9.2 8.9  CREATININE 1.32* 1.48* 1.31* 1.61* 1.29 1.17  GFRNONAA 51* 44* 51*  --   --  60*  GFRAA 59* 51* 59*  --   --  >60  LIVER FUNCTION TESTS: Recent Labs    11/21/17 0300 11/27/17 1123 08/25/18 0918  BILITOT 0.8 0.5 1.3*  AST 22 19 31   ALT 20 19 9   ALKPHOS 65 73 42  PROT 6.0*  7.2 6.6  ALBUMIN 2.7* 3.6 3.1*    TUMOR MARKERS: No results for input(s): AFPTM, CEA, CA199, CHROMGRNA in the last 8760 hours.  Assessment and Plan:  Patient with ongoing dyspnea and left sided chest pain since 11/2017 followed by Dr. Elsworth Soho - admitted to [facility] for same from 4/419 - 11/24/17 where he underwent left sided thoracentesis which showed no malignant cells, he continued to have worsening symptoms and underwent CT chest w/o contrast on 05/18/18 which showed small residual left pleural effusion and diffuse left-sided pleural thickening, his symptoms persisted after various medication therapies and he subsequently underwent PET scan on 08/07/18 which showed marked hypermetabolic activity associated with the extensive pleural thickening throughout the left hemithorax; associated mildly enlarged and hypermetabolic mediastinal, left internal mammary and upper abdominal lymph nodes; 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 Request was made to IR for pleural biopsy for which he presents today.  Patient has been NPO since midnight last night, last does of Eliquis was 08/21/18. Afebrile, WBC 6.9, hgb 14.4, plt 322, INR 1.05, creatinine 1.17.  Risks and benefits discussed with the patient including, but not limited to bleeding, infection, damage to adjacent structures, pneumothorax requiring chest tube placement or low yield requiring additional tests.  All of the patient's questions were answered, patient is agreeable to proceed.  Consent signed and in chart.  Thank you for this interesting consult.  I greatly enjoyed meeting [name] and look forward to participating in their care.  A copy of this report was sent to the requesting provider on this date.  Electronically Signed: Joaquim Nam, PA-C 08/25/2018, 10:26 AM   I spent a total of  30 Minutes  in face to face in clinical consultation, greater than 50% of which was counseling/coordinating care for left pleural  biopsy.

## 2018-08-25 NOTE — Discharge Instructions (Signed)
Lung Biopsy, Care After °This sheet gives you information about how to care for yourself after your procedure. Your health care provider may also give you more specific instructions depending on the type of biopsy you had. If you have problems or questions, contact your health care provider. °What can I expect after the procedure? °After the procedure, it is common to have: °· A cough. °· A sore throat. °· Pain where a needle, bronchoscope, or incision was used to collect a biopsy sample (biopsy site). °Follow these instructions at home: °Medicines ° °· Take over-the-counter and prescription medicines only as told by your health care provider. °· Do not drive for 24 hours if you were given a sedative. °· Do not drink alcohol while taking pain medicine. °· Do not drive or use heavy machinery while taking prescription pain medicine. °· To prevent or treat constipation while you are taking prescription pain medicine, your health care provider may recommend that you: °? Drink enough fluid to keep your urine clear or pale yellow. °? Take over-the-counter or prescription medicines. °? Eat foods that are high in fiber, such as fresh fruits and vegetables, whole grains, and beans. °? Limit foods that are high in fat and processed sugars, such as fried and sweet foods. °Activity °· If you had an incision during your procedure, avoid activities that may pull the incision site open. °· Return to your normal activities as told by your health care provider. Ask your health care provider what activities are safe for you. °If you had an open biopsy:  °· Follow instructions from your health care provider about how to take care of your incision. Make sure you: °? Wash your hands with soap and water before you change your bandage (dressing). If soap and water are not available, use hand sanitizer. °? Change your dressing as told by your health care provider. °? Leave stitches (sutures), skin glue, or adhesive strips in place. These  skin closures may need to stay in place for 2 weeks or longer. If adhesive strip edges start to loosen and curl up, you may trim the loose edges. Do not remove adhesive strips completely unless your health care provider tells you to do that. °· Check your incision area every day for signs of infection. Check for: °? Redness, swelling, or pain. °? Fluid or blood. °? Warmth. °? Pus or a bad smell. °General instructions °· It is up to you to get the results of your procedure. Ask your health care provider, or the department that is doing the procedure, when your results will be ready. °Contact a health care provider if: °· You have a fever. °· You have redness, swelling, or pain around your biopsy site. °· You have fluid or blood coming from your biopsy site. °· Your biopsy site feels warm to the touch. °· You have pus or a bad smell coming from your biopsy site. °Get help right away if: °· You cough up blood. °· You have trouble breathing. °· You have chest pain. °Summary °· After the procedure, it is common to have a sore throat and a cough. °· Return to your normal activities as told by your health care provider. Ask your health care provider what activities are safe for you. °· Take over-the-counter and prescription medicines only as told by your health care provider. °· Report any unusual symptoms to your health care provider. °This information is not intended to replace advice given to you by your health care provider. Make sure   you discuss any questions you have with your health care provider. °Document Released: 09/03/2016 Document Revised: 09/03/2016 Document Reviewed: 09/03/2016 °Elsevier Interactive Patient Education © 2019 [employer] Inc[employer] ° °

## 2018-08-25 NOTE — Procedures (Signed)
Interventional Radiology Procedure Note  Procedure: CT guided biopsy of left pleural mass Complications: None Recommendations: - Bedrest until CXR cleared.  Minimize talking, coughing or otherwise straining.  - Follow up 1 hr CXR pending  - NPO until CXR cleared  Signed,  Corrie Mckusick, DO

## 2018-09-01 ENCOUNTER — Encounter: Payer: Self-pay | Admitting: Pulmonary Disease

## 2018-09-01 ENCOUNTER — Ambulatory Visit: Payer: Medicare Other | Admitting: Pulmonary Disease

## 2018-09-01 DIAGNOSIS — I10 Essential (primary) hypertension: Secondary | ICD-10-CM

## 2018-09-01 DIAGNOSIS — J910 Malignant pleural effusion: Secondary | ICD-10-CM | POA: Diagnosis not present

## 2018-09-01 NOTE — Patient Instructions (Signed)
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  Referral to oncology has been made.  STO P taking benazepril , this may be worsening your cough Measure your blood pressure once every 2 days until cardiology appointment If higher than 140/90, call us back and we can substitute with different medication

## 2018-09-01 NOTE — Progress Notes (Signed)
   Subjective:    Patient ID: [name], male    DOB: January 18, 1941, 78 y.o.   MRN: [id number]  HPI 78 yo for FU forleftpleural effusion4/2019& persistent dyspnea Past medical history of CKD stage III, hypertension, atrial fibrillation status post ablation  Chief Complaint  Patient presents with  . Follow-up    F/U for CT biopsy results. States breathing has been the same since last visit.     Thoracentesis showed a neutrophilic exudate  environmental history- he worked in [employer] all his life , he worked for a week clearing asbestos  PET showed Marked hypermetabolic activity associated with the extensive pleural thickening throughout the left hemithorax. There are associated mildly enlarged and hypermetabolic mediastinal, left internal mammary and upper abdominal lymph nodes  CT guided bx showed mesothelioma  I gave him biopsy results today.  He continues to complain of cough and dyspnea especially when he takes a deep breath  Significant tests/ events reviewed  Spirometry 04/2018  shows moderate restriction with ratio of 72 and FEV1 and FVC of 58%  CT chest 04/2018 Small residual left pleural effusion is identified. Diffuse left-sided pleural thickening is noted likely of a reactive nature  CTangiochest November 20, 2017>negative for PE, medium sized left pleural effusion with associated atelectasis Left thoracentesis November 22, 2017 with 480 cc of bloody fluid removed,cytology negative for malignant cells,pleural fluid culture negative  HST 06/2018 neg  Past Medical History:  Diagnosis Date  . Atrial flutter ([place]) 03-2012  . Chronic back pain   . Headache(784.0)    migraines x 3 in life  . Hearing loss   . Hyperlipidemia   . Hypertension   . HYPOGONADISM, MALE 10/12/2007   Followup per urology    . Melanoma ([place])    melanoma left forearm 2005- no chemo  . PROSTATE CANCER, HX OF 10/06/2008   Status post surgery approximately 2010        Review of Systems neg for any significant sore throat, dysphagia, itching, sneezing, nasal congestion or excess/ purulent secretions, fever, chills, sweats, unintended wt loss, pleuritic or exertional cp, hempoptysis, orthopnea pnd or change in chronic leg swelling. Also denies presyncope, palpitations, heartburn, abdominal pain, nausea, vomiting, diarrhea or change in bowel or urinary habits, dysuria,hematuria, rash, arthralgias, visual complaints, headache, numbness weakness or ataxia.     Objective:   Physical Exam  Gen. Pleasant, well-nourished, in no distress ENT - no thrush, no pallor/icterus,no post nasal drip Neck: No JVD, no thyromegaly, no carotid bruits Lungs: no use of accessory muscles, no dullness to percussion, left basal  rales no rhonchi  Cardiovascular: Rhythm regular, heart sounds  normal, no murmurs or gallops, no peripheral edema Musculoskeletal: No deformities, no cyanosis or clubbing        Assessment & Plan:

## 2018-09-01 NOTE — Assessment & Plan Note (Signed)
STO P taking benazepril , this may be worsening your cough Measure your blood pressure once every 2 days until cardiology appointment If higher than 140/90, call us back and we can substitute with different medication

## 2018-09-01 NOTE — Assessment & Plan Note (Addendum)
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  Referral to oncology has been made. All questions were answered

## 2018-09-03 ENCOUNTER — Telehealth: Payer: Self-pay | Admitting: Pulmonary Disease

## 2018-09-03 DIAGNOSIS — J910 Malignant pleural effusion: Secondary | ICD-10-CM

## 2018-09-03 NOTE — Telephone Encounter (Signed)
I called and spoke with the pt  He states that the cancer center was supposed to discuss his case today and he is wanting to know the outcome  I looked under referral and order had never been placed  I made a stat referral today  Will forward to RA to see if this was ever even discussed at the meeting

## 2018-09-04 NOTE — Telephone Encounter (Signed)
Please obtain appointment ASAP with Dr. Mohammed/ oncology and inform patient of appointment date and time Please let him know that his details will be discussed on the meeting next Thursday

## 2018-09-04 NOTE — Telephone Encounter (Signed)
[facility] please advise on this, thank you.

## 2018-09-04 NOTE — Telephone Encounter (Signed)
Patient calling back as he has not heard from [place].  He is very anxious about this and is asking for an update call from the nurse this afternoon, CB is [phone].

## 2018-09-04 NOTE — Telephone Encounter (Signed)
Called and spoke with the patient today regarding below messages. Pt is frustrated because he wants answers and feels that he cannot get any  Pt would like to know what stage of cancer he is in, and more details. Pt has not heard from Dr. Earlie Server MD office at this time for an appt Advised patient RA recommendations and comments below. Advised patient that I called oncology Dr. Claudie Fisherman MD off at [phone] LVM for s/o to return my call. Pt asking that all records from our office to be sent to [place] release form to pt today, has mailing address of [address] on it. Pt verbalized understanding, had no further concerns.

## 2018-09-04 NOTE — Telephone Encounter (Signed)
This order has been sent to [facility] office it is in review they will contact the patient these are scheduled by his office they have to review the case before they make the appt please make the  Patient aware

## 2018-09-05 NOTE — Telephone Encounter (Signed)
Dr. Elsworth Soho spoke to me about this patient.  I understand the final pathology is still pending.  We have been working on getting him an appointment once the pathology is confirmed as mesothelioma.  We would love to take care of him here but if he decided to go to [facility], I will cancel any appointment plans here. Thank you

## 2018-09-07 NOTE — Telephone Encounter (Signed)
Response from [place], RN  Shirlee Limerick  I just wanted to follow up with you. He is going to be seen at [facility] so he will not be seen here. Let me know if you have any questions, thanks Hinton Dyer

## 2018-09-07 NOTE — Telephone Encounter (Signed)
Spoke with pt. He is aware of Dr. Lew Dawes message to [place]. Pt was very upset and wanted to know why he wasn't told this when we called on Friday. Advised him that we were not aware of this information when we called him on Friday. States that he is going to come to our office and fill out a records release form so that we can send his records to [facility] for a second opinion.

## 2018-09-10 ENCOUNTER — Telehealth: Payer: Self-pay | Admitting: *Deleted

## 2018-09-10 ENCOUNTER — Telehealth: Payer: Self-pay | Admitting: Pulmonary Disease

## 2018-09-10 DIAGNOSIS — C459 Mesothelioma, unspecified: Secondary | ICD-10-CM

## 2018-09-10 NOTE — Telephone Encounter (Signed)
Definitely we will be happy to see him next week.  I think I have some openings on September 17, 2018 afternoon.  Thank you for the referral.

## 2018-09-10 NOTE — Telephone Encounter (Signed)
Called him to give him results of second opinion which was also mesothelioma.  He said that there had been a misunderstanding, that he did want to keep an appointment with the cancer center here with Dr. Julien Nordmann He also called the cancer center himself requesting an appointment. We will request oncology for an appointment again for consultation

## 2018-09-10 NOTE — Telephone Encounter (Signed)
Received call from patient inquiring about his status as a new pt with Dr. Julien Nordmann. New diagnosis of mesothelioma.  I did not see any appts for this gentleman on Dr. Worthy Flank schedule.  Please advise.

## 2018-09-11 ENCOUNTER — Telehealth: Payer: Self-pay | Admitting: *Deleted

## 2018-09-11 ENCOUNTER — Encounter (HOSPITAL_COMMUNITY): Payer: Self-pay

## 2018-09-11 DIAGNOSIS — J910 Malignant pleural effusion: Secondary | ICD-10-CM

## 2018-09-11 NOTE — Telephone Encounter (Signed)
Thank you Dr. Julien Nordmann for getting him in [place] please let patient know about his appointment

## 2018-09-11 NOTE — Telephone Encounter (Signed)
Patient is aware of appt. Will close this encounter.

## 2018-09-11 NOTE — Telephone Encounter (Signed)
Oncology Nurse Navigator Documentation  Oncology Nurse Navigator Flowsheets 09/11/2018  Navigator Location [facility]  Referral date to RadOnc/MedOnc 09/11/2018  Navigator Encounter Type Telephone/I called Mr. Sjogren today to clarify referral to be seen here. He would like to be seen with Dr. Julien Nordmann.  I scheduled him to be seen with Dr. Julien Nordmann next week. He verbalized understanding of appt time and place.   Telephone Outgoing Call  Treatment Phase Pre-Tx/Tx Discussion  Barriers/Navigation Needs Education;Coordination of Care  Education Other  Interventions Coordination of Care;Education  Coordination of Care Appts  Education Method Verbal  Acuity Level 2  Time Spent with Patient 30

## 2018-09-12 ENCOUNTER — Other Ambulatory Visit: Payer: Self-pay | Admitting: Family Medicine

## 2018-09-15 ENCOUNTER — Ambulatory Visit: Payer: Medicare Other | Admitting: Internal Medicine

## 2018-09-15 ENCOUNTER — Encounter: Payer: Self-pay | Admitting: Internal Medicine

## 2018-09-15 VITALS — BP 122/66 | HR 60 | Ht 72.0 in | Wt 187.8 lb

## 2018-09-15 DIAGNOSIS — I10 Essential (primary) hypertension: Secondary | ICD-10-CM

## 2018-09-15 DIAGNOSIS — I4892 Unspecified atrial flutter: Secondary | ICD-10-CM | POA: Diagnosis not present

## 2018-09-15 NOTE — Progress Notes (Signed)
HPI Mr. Eliberto Ivory returns today for followup. He is a pleasant 78yo man with atrial flutter s/p ablation who developed recurrent flutter over a year out from his initial ablation and underwent repeat ablation 4 yearsago. In the interim, he has been stable except that he developed an episode of pleuritic chest pain, and was found to have a pleural effusion for which she had 600 cc of fluid removed. He was then diagnosed with probable mesothelioma. He is pending evaluation by Dr. Julien Nordmann.  Allergies  Allergen Reactions  . Eucalyptol     Dried Eucalytus:  Head stops up  . Statins Other (See Comments)    Body aches, memory loss     Current Outpatient Medications  Medication Sig Dispense Refill  . albuterol (PROVENTIL HFA;VENTOLIN HFA) 108 (90 Base) MCG/ACT inhaler Inhale 2 puffs into the lungs every 6 (six) hours as needed for wheezing or shortness of breath. 1 Inhaler 6  . apixaban (ELIQUIS) 5 MG TABS tablet Take 1 tablet (5 mg total) by mouth 2 (two) times daily. 60 tablet 6  . fenofibrate 160 MG tablet TAKE 1 TABLET BY MOUTH  DAILY 90 tablet 2  . oxybutynin (DITROPAN-XL) 10 MG 24 hr tablet Take 10 mg by mouth daily.  11  . verapamil (CALAN-SR) 240 MG CR tablet TAKE 1 TABLET BY MOUTH AT  BEDTIME 90 tablet 3   No current facility-administered medications for this visit.      Past Medical History:  Diagnosis Date  . Atrial flutter ([place]) 03-2012  . Chronic back pain   . Headache(784.0)    migraines x 3 in life  . Hearing loss   . Hyperlipidemia   . Hypertension   . HYPOGONADISM, MALE 10/12/2007   Followup per urology    . Melanoma ([place])    melanoma left forearm 2005- no chemo  . PROSTATE CANCER, HX OF 10/06/2008   Status post surgery approximately 2010      ROS:   All systems reviewed and negative except as noted in the HPI.   Past Surgical History:  Procedure Laterality Date  . ABLATION  01-24-2014   repeat CTI ablation by Dr Lovena Le  . ABLATION OF DYSRHYTHMIC FOCUS   10/15/2012   CTI ablation by Dr Lovena Le for atrial flutter  . arhtroscopy  right knee  2010  . ATRIAL FLUTTER ABLATION N/A 10/15/2012   Procedure: ATRIAL FLUTTER ABLATION;  Surgeon: Evans Lance, MD;  Location: [facility] CATH LAB;  Service: Cardiovascular;  Laterality: N/A;  . ATRIAL FLUTTER ABLATION N/A 01/24/2014   Procedure: ATRIAL FLUTTER ABLATION;  Surgeon: Evans Lance, MD;  Location: [facility] CATH LAB;  Service: Cardiovascular;  Laterality: N/A;  . BACK SURGERY  07/2006   lumbar discectomy  . CARDIAC CATHETERIZATION     15 years ago  . CARDIOVERSION  05/18/2012   Procedure: CARDIOVERSION;  Surgeon: Peter M Martinique, MD;  Location: [facility] ENDOSCOPY;  Service: Cardiovascular;  Laterality: N/A;  on eloquis on-going  . COLONOSCOPY W/ POLYPECTOMY    . LUMBAR LAMINECTOMY/DECOMPRESSION MICRODISCECTOMY  08/16/2011   Procedure: LUMBAR LAMINECTOMY/DECOMPRESSION MICRODISCECTOMY;  Surgeon: Dahlia Bailiff;  Location: [facility] ORS;  Service: Orthopedics;  Laterality: N/A;  Lumbar Decompression and Foraminotomy L4-S1    . MELANOMA EXCISION  12/13/03   [place] [facility]  . PROSTATECTOMY  07/2008  . ROTATOR CUFF REPAIR  2008   right; Dr Justice Britain  . TONSILLECTOMY  1947     Family History  Problem Relation Age of Onset  .  Pancreatic cancer Mother   . Colon cancer Brother   . Esophageal cancer Neg Hx   . Stomach cancer Neg Hx   . Rectal cancer Neg Hx      Social History   Socioeconomic History  . Marital status: Married    Spouse name: Not on file  . Number of children: 2  . Years of education: Not on file  . Highest education level: Not on file  Occupational History  . Occupation: Film/video editor  Social Needs  . Financial resource strain: Not on file  . Food insecurity:    Worry: Not on file    Inability: Not on file  . Transportation needs:    Medical: Not on file    Non-medical: Not on file  Tobacco Use  . Smoking status: Former Smoker    Packs/day: 1.00    Years: 20.00    Pack years:  20.00    Types: Cigarettes    Last attempt to quit: 08/08/1984    Years since quitting: 34.1  . Smokeless tobacco: Never Used  Substance and Sexual Activity  . Alcohol use: Yes    Alcohol/week: 2.0 standard drinks    Types: 2 Cans of beer per week    Comment: socially   2 beer or mixed drinks week  . Drug use: No  . Sexual activity: Never  Lifestyle  . Physical activity:    Days per week: Not on file    Minutes per session: Not on file  . Stress: Not on file  Relationships  . Social connections:    Talks on phone: Not on file    Gets together: Not on file    Attends religious service: Not on file    Active member of club or organization: Not on file    Attends meetings of clubs or organizations: Not on file    Relationship status: Not on file  . Intimate partner violence:    Fear of current or ex partner: Not on file    Emotionally abused: Not on file    Physically abused: Not on file    Forced sexual activity: Not on file  Other Topics Concern  . Not on file  Social History Narrative   in [place] since '84   Desires CPR     BP 122/66   Pulse 60   Ht 6' (1.829 m)   Wt 187 lb 12.8 oz (85.2 kg)   SpO2 92%   BMI 25.47 kg/m   Physical Exam:  stable appearing NAD HEENT: Unremarkable Neck:  No JVD, no thyromegally Lymphatics:  No adenopathy Back:  No CVA tenderness Lungs:  Clear with reduced breath sound in left base with egophony HEART:  Regular rate rhythm, no murmurs, no rubs, no clicks Abd:  soft, positive bowel sounds, no organomegally, no rebound, no guarding Ext:  2 plus pulses, no edema, no cyanosis, no clubbing Skin:  No rashes no nodules Neuro:  CN II through XII intact, motor grossly intact  EKG NSR with RAE and IRBBB  Assess/Plan: 1. Atrial flutter - he is s/p ablation and doing well from this.  2. PAF - he has had a single episode and has been on eliquis. If his blood count is down, I would suggest stopping the eliquis. 3. Mesothelioma - he has  lost weight and has peristent pleural effusion.  I spent 25 minutes including 50% face to face time with the patient.   Mikle Bosworth.D.

## 2018-09-15 NOTE — Patient Instructions (Addendum)
Medication Instructions:  Your physician recommends that you continue on your current medications as directed. Please refer to the Current Medication list given to you today.  Labwork: None ordered.  Testing/Procedures: None ordered.  Follow-Up: Your physician wants you to follow-up in: 3 months with Dr. Taylor.      Any Other Special Instructions Will Be Listed Below (If Applicable).  If you need a refill on your cardiac medications before your next appointment, please call your pharmacy.   

## 2018-09-17 ENCOUNTER — Inpatient Hospital Stay: Payer: Medicare Other | Attending: Internal Medicine

## 2018-09-17 ENCOUNTER — Inpatient Hospital Stay (HOSPITAL_BASED_OUTPATIENT_CLINIC_OR_DEPARTMENT_OTHER): Payer: Medicare Other | Admitting: Internal Medicine

## 2018-09-17 ENCOUNTER — Encounter: Payer: Self-pay | Admitting: Internal Medicine

## 2018-09-17 ENCOUNTER — Telehealth: Payer: Self-pay

## 2018-09-17 ENCOUNTER — Other Ambulatory Visit: Payer: Self-pay

## 2018-09-17 VITALS — BP 136/75 | HR 66 | Temp 99.3°F | Resp 20 | Ht 73.0 in | Wt 186.3 lb

## 2018-09-17 DIAGNOSIS — C450 Mesothelioma of pleura: Secondary | ICD-10-CM

## 2018-09-17 DIAGNOSIS — I4891 Unspecified atrial fibrillation: Secondary | ICD-10-CM

## 2018-09-17 DIAGNOSIS — J90 Pleural effusion, not elsewhere classified: Secondary | ICD-10-CM | POA: Diagnosis not present

## 2018-09-17 DIAGNOSIS — Z79899 Other long term (current) drug therapy: Secondary | ICD-10-CM

## 2018-09-17 DIAGNOSIS — Z8546 Personal history of malignant neoplasm of prostate: Secondary | ICD-10-CM | POA: Diagnosis not present

## 2018-09-17 DIAGNOSIS — Z7901 Long term (current) use of anticoagulants: Secondary | ICD-10-CM

## 2018-09-17 DIAGNOSIS — Z87891 Personal history of nicotine dependence: Secondary | ICD-10-CM | POA: Insufficient documentation

## 2018-09-17 DIAGNOSIS — J910 Malignant pleural effusion: Secondary | ICD-10-CM

## 2018-09-17 DIAGNOSIS — Z85820 Personal history of malignant melanoma of skin: Secondary | ICD-10-CM

## 2018-09-17 DIAGNOSIS — I10 Essential (primary) hypertension: Secondary | ICD-10-CM | POA: Insufficient documentation

## 2018-09-17 DIAGNOSIS — R05 Cough: Secondary | ICD-10-CM | POA: Insufficient documentation

## 2018-09-17 DIAGNOSIS — Z8249 Family history of ischemic heart disease and other diseases of the circulatory system: Secondary | ICD-10-CM | POA: Insufficient documentation

## 2018-09-17 DIAGNOSIS — Z803 Family history of malignant neoplasm of breast: Secondary | ICD-10-CM | POA: Diagnosis not present

## 2018-09-17 DIAGNOSIS — R5383 Other fatigue: Secondary | ICD-10-CM | POA: Diagnosis not present

## 2018-09-17 DIAGNOSIS — Z7189 Other specified counseling: Secondary | ICD-10-CM

## 2018-09-17 DIAGNOSIS — Z5111 Encounter for antineoplastic chemotherapy: Secondary | ICD-10-CM

## 2018-09-17 LAB — CBC WITH DIFFERENTIAL (CANCER CENTER ONLY)
Abs Immature Granulocytes: 0.01 10*3/uL (ref 0.00–0.07)
Basophils Absolute: 0.1 10*3/uL (ref 0.0–0.1)
Basophils Relative: 1 %
EOS ABS: 0.2 10*3/uL (ref 0.0–0.5)
Eosinophils Relative: 3 %
HEMATOCRIT: 42.7 % (ref 39.0–52.0)
Hemoglobin: 14.2 g/dL (ref 13.0–17.0)
Immature Granulocytes: 0 %
Lymphocytes Relative: 24 %
Lymphs Abs: 1.7 10*3/uL (ref 0.7–4.0)
MCH: 28.4 pg (ref 26.0–34.0)
MCHC: 33.3 g/dL (ref 30.0–36.0)
MCV: 85.4 fL (ref 80.0–100.0)
Monocytes Absolute: 0.9 10*3/uL (ref 0.1–1.0)
Monocytes Relative: 12 %
Neutro Abs: 4.4 10*3/uL (ref 1.7–7.7)
Neutrophils Relative %: 60 %
Platelet Count: 293 10*3/uL (ref 150–400)
RBC: 5 MIL/uL (ref 4.22–5.81)
RDW: 13.4 % (ref 11.5–15.5)
WBC Count: 7.2 10*3/uL (ref 4.0–10.5)
nRBC: 0 % (ref 0.0–0.2)

## 2018-09-17 LAB — CMP (CANCER CENTER ONLY)
ALK PHOS: 55 U/L (ref 38–126)
ALT: 7 U/L (ref 0–44)
AST: 13 U/L — ABNORMAL LOW (ref 15–41)
Albumin: 3.3 g/dL — ABNORMAL LOW (ref 3.5–5.0)
Anion gap: 7 (ref 5–15)
BUN: 12 mg/dL (ref 8–23)
CALCIUM: 9.3 mg/dL (ref 8.9–10.3)
CO2: 26 mmol/L (ref 22–32)
Chloride: 106 mmol/L (ref 98–111)
Creatinine: 1.1 mg/dL (ref 0.61–1.24)
GFR, Estimated: >60 mL/min (ref >60)
Glucose, Bld: 84 mg/dL (ref 70–99)
Potassium: 4.2 mmol/L (ref 3.5–5.1)
Sodium: 139 mmol/L (ref 135–145)
Total Bilirubin: 0.4 mg/dL (ref 0.3–1.2)
Total Protein: 7 g/dL (ref 6.5–8.1)

## 2018-09-17 NOTE — Telephone Encounter (Signed)
I did read about his diagnosis and I am so sorry.  Please express my well wishes.  My mother passed away from mesothelioma so I have a very special place in my heart for him.  Please let us know how else I can help.

## 2018-09-17 NOTE — Telephone Encounter (Signed)
TA-This pt called and wanted to thank [place] for interceding on his behalf and getting him seen so quickly/he was Dx with Mesothelioma/He has been made aware that there is the possibility for him to get money to help with his medical bills and possibly some left over to help his wife when he passes/he is going to be seen at [facility] soon/but he wanted to express his gratitude/thx dmf

## 2018-09-17 NOTE — Progress Notes (Signed)
[place] [phone]   [phone]  CONSULT NOTE  REFERRING PHYSICIAN: Dr. Kara Mead  REASON FOR CONSULTATION:  78 years old white male recently diagnosed with malignant pleural mesothelioma  HPI [name] is a 78 y.o. male with past medical history significant for atrial fibrillation and currently on treatment with Eliquis, chronic back pain, hypertension, dyslipidemia, history of early stage melanoma status post resection, history of prostate cancer status post resection and 2010.  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  The patient mentions that he has been complaining of chest pain as well as shortness of breath and cough for few months.  He was seen at the emergency department in April 2019.  CT angiogram of the chest on 11/20/2017 showed no pulmonary emboli and there was medium sized left pleural effusion with associated atelectasis.  On 11/22/2017 the patient underwent ultrasound-guided left thoracentesis with drainage of 480 mL of pleural fluid.  The final cytology (NZA [phone]) showed no malignant cells and there was acute inflammation.  The patient was seen by Dr. Elsworth Soho and he was followed by observation and repeat imaging studies.  CT scan of the chest on 05/18/2018 showed a small residual left pleural effusion identified with diffuse left-sided pleural thickening likely of reactive nature.  This has increased in the interval from the prior exam.  There was no other focal abnormality noted.  The patient was again followed by observation but he continues to have worsening of his symptoms.  He was reevaluated again by Dr. Elsworth Soho and a PET scan was performed on August 07, 2018 and that showed market hypermetabolic activity associated with the extensive pleural thickening throughout the left hemithorax.  There are associated mildly enlarged and hypermetabolic mediastinal, left internal  mammary and upper abdominal lymph nodes.  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  Mesothelioma was felt to be less likely but a pleural biopsy was recommended.  There was no extrathoracic involvement identified in the abdomen or pelvis. On August 25, 2017 the patient underwent CT-guided core biopsy of left-sided pleural mass by interventional radiology.  The final pathology ([id number]) was consistent with malignant mesothelioma, epithelioid type.  The case was sent to [facility] for outside consultation, reference# [id number]. "The biopsies show a malignant epithelioid neoplasm composed of predominantly of sheets and papillae of plump eosinophilic cells with high nuclear-to-cytoplasmic ratios, focal cytoplasmic vacuolization, round nuclear with irregular nuclear borders, fine chromatin and prominent nucleoli. There are scattered mitoses and focal tumor necrosis. Immunohistochemical (IHC) stains performed in your laboratory show that the tumor is positive for cytokeratin AE1/AE3, CK8/18, CK7 (patchy), calretinin (patchy), D2-40 and WT1, but negative for CK20, TTF-1, Napsin A, CDX2, PSA, PSAP, and S100 protein. GATA 3, PAX 8 and Melan-A immunostains are non-contributory. Thank you for providing [place] with photo images of additional immunostains, which show that the tumor is focally positive for CD15 but negative for CEA and MOC31. The clinical setting of hypermetabolic diffuse left pleural thickening and persistent pleural effusion and the overall tumor morphology and immunophenotype (three positive mesothelial markers and essentially negative carcinoma markers) are consistent with the diagnosis of malignant mesothelioma, epithelioid type." The patient was referred to me today for evaluation and recommendation regarding treatment of his condition. When seen today he is feeling fine except for the lack of energy as well as shortness of breath and mild  left-sided chest pain in  addition to mild cough and no hemoptysis.  He lost around 35 pounds in the last 6 months.  He denied having any nausea, vomiting, diarrhea or constipation.  He denied having any headache or visual changes. Family history significant for mother with pancreatic cancer, brother had colon cancer, father had congestive heart failure and sister had breast cancer. The patient is married and has 2 children a son and daughter.  He was accompanied today by his wife, [name].  The patient used to work as Materials engineer.  He indicated that he was exposed to asbestos in the past.  He has a history of smoking for around 25 years and quit in 1985.  He drinks alcohol few times a week and no history of drug abuse.  HPI  Past Medical History:  Diagnosis Date  . Atrial flutter ([place]) 03-2012  . Chronic back pain   . Headache(784.0)    migraines x 3 in life  . Hearing loss   . Hyperlipidemia   . Hypertension   . HYPOGONADISM, MALE 10/12/2007   Followup per urology    . Melanoma ([place])    melanoma left forearm 2005- no chemo  . PROSTATE CANCER, HX OF 10/06/2008   Status post surgery approximately 2010      Past Surgical History:  Procedure Laterality Date  . ABLATION  01-24-2014   repeat CTI ablation by Dr Lovena Le  . ABLATION OF DYSRHYTHMIC FOCUS  10/15/2012   CTI ablation by Dr Lovena Le for atrial flutter  . arhtroscopy  right knee  2010  . ATRIAL FLUTTER ABLATION N/A 10/15/2012   Procedure: ATRIAL FLUTTER ABLATION;  Surgeon: Evans Lance, MD;  Location: [facility] CATH LAB;  Service: Cardiovascular;  Laterality: N/A;  . ATRIAL FLUTTER ABLATION N/A 01/24/2014   Procedure: ATRIAL FLUTTER ABLATION;  Surgeon: Evans Lance, MD;  Location: [facility] CATH LAB;  Service: Cardiovascular;  Laterality: N/A;  . BACK SURGERY  07/2006   lumbar discectomy  . CARDIAC CATHETERIZATION     15 years ago  . CARDIOVERSION  05/18/2012   Procedure: CARDIOVERSION;  Surgeon: Peter M Martinique, MD;   Location: [facility] ENDOSCOPY;  Service: Cardiovascular;  Laterality: N/A;  on eloquis on-going  . COLONOSCOPY W/ POLYPECTOMY    . LUMBAR LAMINECTOMY/DECOMPRESSION MICRODISCECTOMY  08/16/2011   Procedure: LUMBAR LAMINECTOMY/DECOMPRESSION MICRODISCECTOMY;  Surgeon: Dahlia Bailiff;  Location: [facility] ORS;  Service: Orthopedics;  Laterality: N/A;  Lumbar Decompression and Foraminotomy L4-S1    . MELANOMA EXCISION  12/13/03   [place] [facility]  . PROSTATECTOMY  07/2008  . ROTATOR CUFF REPAIR  2008   right; Dr Justice Britain  . TONSILLECTOMY  1947    Family History  Problem Relation Age of Onset  . Pancreatic cancer Mother   . Colon cancer Brother   . Esophageal cancer Neg Hx   . Stomach cancer Neg Hx   . Rectal cancer Neg Hx     Social History Social History   Tobacco Use  . Smoking status: Former Smoker    Packs/day: 1.00    Years: 20.00    Pack years: 20.00    Types: Cigarettes    Last attempt to quit: 08/08/1984    Years since quitting: 34.1  . Smokeless tobacco: Never Used  Substance Use Topics  . Alcohol use: Yes    Alcohol/week: 2.0 standard drinks    Types: 2 Cans of beer per week    Comment: socially   2 beer or mixed drinks week  . Drug  use: No    Allergies  Allergen Reactions  . Eucalyptol     Dried Eucalytus:  Head stops up  . Statins Other (See Comments)    Body aches, memory loss    Current Outpatient Medications  Medication Sig Dispense Refill  . albuterol (PROVENTIL HFA;VENTOLIN HFA) 108 (90 Base) MCG/ACT inhaler Inhale 2 puffs into the lungs every 6 (six) hours as needed for wheezing or shortness of breath. 1 Inhaler 6  . apixaban (ELIQUIS) 5 MG TABS tablet Take 1 tablet (5 mg total) by mouth 2 (two) times daily. 60 tablet 6  . fenofibrate 160 MG tablet TAKE 1 TABLET BY MOUTH  DAILY 90 tablet 2  . oxybutynin (DITROPAN-XL) 10 MG 24 hr tablet Take 10 mg by mouth daily.  11  . verapamil (CALAN-SR) 240 MG CR tablet TAKE 1 TABLET BY MOUTH AT  BEDTIME 90 tablet 3   No  current facility-administered medications for this visit.     Review of Systems  Constitutional: positive for anorexia, fatigue and weight loss Eyes: negative Ears, nose, mouth, throat, and face: negative Respiratory: positive for cough, dyspnea on exertion and pleurisy/chest pain Cardiovascular: negative Gastrointestinal: negative Genitourinary:negative Integument/breast: negative Hematologic/lymphatic: negative Musculoskeletal:negative Neurological: negative Behavioral/Psych: negative Endocrine: negative Allergic/Immunologic: negative  Physical Exam  TIR:WERXV, healthy, no distress, well nourished, well developed and anxious SKIN: skin color, texture, turgor are normal, no rashes or significant lesions HEAD: Normocephalic, No masses, lesions, tenderness or abnormalities EYES: normal, PERRLA, Conjunctiva are pink and non-injected EARS: External ears normal, Canals clear OROPHARYNX:no exudate, no erythema and lips, buccal mucosa, and tongue normal  NECK: supple, no adenopathy, no JVD LYMPH:  no palpable lymphadenopathy, no hepatosplenomegaly LUNGS: coarse sounds heard, decreased breath sounds HEART: regular rate & rhythm, no murmurs and no gallops ABDOMEN:abdomen soft, non-tender, normal bowel sounds and no masses or organomegaly BACK: Back symmetric, no curvature., No CVA tenderness EXTREMITIES:no joint deformities, effusion, or inflammation, no edema  NEURO: alert & oriented x 3 with fluent speech, no focal motor/sensory deficits  PERFORMANCE STATUS: ECOG 1  LABORATORY DATA: Lab Results  Component Value Date   WBC 7.2 09/17/2018   HGB 14.2 09/17/2018   HCT 42.7 09/17/2018   MCV 85.4 09/17/2018   PLT 293 09/17/2018      Chemistry      Component Value Date/Time   NA 138 09/12/2018 0918   K 4.8 09-12-18 0918   CL 109 09-12-2018 0918   CO2 21 (L) September 12, 2018 0918   BUN 13 September 12, 2018 0918   CREATININE 1.17 09/12/2018 0918      Component Value Date/Time    CALCIUM 8.9 2018-09-12 0918   ALKPHOS 42 2018/09/12 0918   AST 31 09/12/2018 0918   ALT 9 September 12, 2018 0918   BILITOT 1.3 (H) September 12, 2018 0918       RADIOGRAPHIC STUDIES: Ct Biopsy  Result Date: 09/12/2018 INDICATION: 78 year old male with a history of left-sided pleural disease, referred for image guided biopsy EXAM: CT BIOPSY MEDICATIONS: None. ANESTHESIA/SEDATION: Moderate (conscious) sedation was employed during this procedure. A total of Versed 1.5 mg and Fentanyl 75 mcg was administered intravenously. Moderate Sedation Time: 17 minutes. The patient's level of consciousness and vital signs were monitored continuously by radiology nursing throughout the procedure under my direct supervision. FLUOROSCOPY TIME:  CT COMPLICATIONS: None PROCEDURE: The procedure, risks, benefits, and alternatives were explained to the patient and the patient's family. Specific risks that were addressed included bleeding, infection, pneumothorax, need for further procedure including chest tube placement, chance of  delayed pneumothorax or hemorrhage, hemoptysis, nondiagnostic sample, cardiopulmonary collapse, death. Questions regarding the procedure were encouraged and answered. The patient understands and consents to the procedure. Patient was positioned in the left decubitus position on the CT gantry table and a scout CT of the chest was performed for planning purposes. Once angle of approach was determined, the skin and subcutaneous tissues this scan was prepped and draped in the usual sterile fashion, and a sterile drape was applied covering the operative field. A sterile gown and sterile gloves were used for the procedure. Local anesthesia was provided with 1% Lidocaine. The skin and subcutaneous tissues were infiltrated 1% lidocaine for local anesthesia, and a small stab incision was made with an 11 blade scalpel. Using CT guidance, a 17 gauge trocar needle was advanced into the pleural tissue at the base of the left  lung. After confirmation of the tip, separate 18 gauge core biopsies were performed. These were placed into solution for transportation to the lab. Needle was removed and a final CT image was performed. Patient tolerated the procedure well and remained hemodynamically stable throughout. No complications were encountered and no significant blood loss was encounter IMPRESSION: Status post CT-guided biopsy of left-sided pleural mass. Tissue specimen sent to pathology for complete histopathologic analysis. Signed, Dulcy Fanny. Dellia Nims, RPVI [facility] Electronically Signed   By: Corrie Mckusick D.O.   On: 08/25/2018 13:20   Dg Chest Port 1 View  Result Date: 08/25/2018 CLINICAL DATA:  Follow-up pleural biopsy on the left EXAM: PORTABLE CHEST 1 VIEW COMPARISON:  CT from earlier in the same day. FINDINGS: Cardiac shadow is stable. The right lung remains clear. Pleural thickening is noted particularly inferiorly similar to that seen on prior CT examination. No underlying pneumothorax is noted. No bony abnormality is seen. IMPRESSION: No evidence of pneumothorax following biopsy. Stable pleural thickening in the left lung base. Electronically Signed   By: Inez Catalina M.D.   On: 08/25/2018 13:17    ASSESSMENT: This is a very pleasant 78 years old white male with highly suspicious stage IV ([place], N2, M1) malignant pleural mesothelioma, epithelioid type diagnosed in January 2020 and presented with left hemithorax disease in addition to mediastinal lymphadenopathy and suspicious hypermetabolic upper abdominal nodes.  PLAN: I had a lengthy discussion with the patient and his wife today about his current disease stage, prognosis and treatment options. I personally and independently reviewed his scan images as well as the pathology report with the patient and his wife today. I discussed with the patient his treatment options including palliative care versus  palliative systemic chemotherapy with cisplatin 75 mg/M2, Alimta 500 mg/M2 and Avastin 15 mg/KG every 3 weeks for a total of 6 cycles followed by maintenance Avastin. I am not sure if the patient will be a good surgical candidate for resection because of the presence of the hypermetabolic abdominal nodes but he would like to meet with a thoracic surgeon for evaluation of this option before proceeding with the systemic chemotherapy.  He is interested in seeing Dr. Wynelle Cleveland at [facility] for discussion of the surgical option. I will refer the patient to Dr. Wynelle Cleveland for reevaluation. I also discussed with the patient the adverse effect of the chemotherapy including but not limited to alopecia, myelosuppression, nausea and vomiting, peripheral neuropathy, liver or renal dysfunction as well as hearing deficit. The patient and his wife had a lot of questions today and I answered them completely to their satisfaction. I  will arrange for the patient a follow-up appointment after his surgical evaluation at [facility] for consideration of the systemic chemotherapy if he is not a good surgical candidate. Other option could be neoadjuvant chemotherapy followed by evaluation for surgical resection.  This will be discussed by Dr. Wynelle Cleveland when he sees the patient in the next 1-2 weeks. The patient was advised to call immediately if he has any concerning symptoms in the interval. The patient voices understanding of current disease status and treatment options and is in agreement with the current care plan.  All questions were answered. The patient knows to call the clinic with any problems, questions or concerns. We can certainly see the patient much sooner if necessary.  Thank you so much for allowing me to participate in the care of [name]. I will continue to follow up the patient with you and assist in his care.  I spent 55 minutes counseling the patient face to face. The total time spent in the appointment  was 80 minutes.  Disclaimer: This note was dictated with voice recognition software. Similar sounding words can inadvertently be transcribed and may not be corrected upon review.   Eilleen Kempf September 17, 2018, 2:05 PM

## 2018-09-18 ENCOUNTER — Telehealth: Payer: Self-pay | Admitting: *Deleted

## 2018-09-22 ENCOUNTER — Telehealth: Payer: Self-pay | Admitting: Internal Medicine

## 2018-09-22 ENCOUNTER — Telehealth: Payer: Self-pay | Admitting: *Deleted

## 2018-09-22 NOTE — Telephone Encounter (Signed)
Faxed records to Dr. Wynelle Cleveland at [facility]

## 2018-09-22 NOTE — Telephone Encounter (Signed)
Oncology Nurse Navigator Documentation  Oncology Nurse Navigator Flowsheets 09/22/2018  Navigator Location [facility]  Navigator Encounter Type Telephone/I received a call from [name].  He wanted me to update Dr. Lovena Le on his lab work.  I did.  He also wanted an update on [facility] referral.  I gave him the number to call who helps with referrals.    Telephone Incoming Call  Treatment Phase Pre-Tx/Tx Discussion  Barriers/Navigation Needs Education;Coordination of Care  Education Other  Interventions Coordination of Care;Education  Coordination of Care Other  Education Method Verbal  Acuity Level 2  Time Spent with Patient 30

## 2018-09-23 ENCOUNTER — Telehealth: Payer: Self-pay | Admitting: Internal Medicine

## 2018-09-23 NOTE — Telephone Encounter (Signed)
New Message   Patient is calling because oncology did some labs on him and they were to send the results to Dr. Lovena Le to review. Those labs were to determine whether or not he is to stay on Eliquis. He states that the they told him that the labs are viewable in [employer]. Please call to discuss.

## 2018-09-24 NOTE — Telephone Encounter (Signed)
Left detailed message per DPR.    Advised per initial review Pt lab work is within normal limits. Per Dr. Tanna Furry last note, Pt should discontinue Eliquis if blood count was decreased.  Advised Pt to continue Eliquis at this time and will have Dr. Lovena Le review lab results next day in office and confirm Pt should continue Eliquis.

## 2018-09-28 ENCOUNTER — Encounter: Payer: Self-pay | Admitting: *Deleted

## 2018-09-28 NOTE — Progress Notes (Signed)
Oncology Nurse Navigator Documentation  Oncology Nurse Navigator Flowsheets 09/28/2018  Navigator Location [facility]  Navigator Encounter Type Other/I checked on [name]'s schedule at [facility].  I did not see an appt for him yet.  I contacted referral coordinator Maudie Mercury to see about his appt.  I was unable tor each but did leave a vm message for her to call me.   Treatment Phase Pre-Tx/Tx Discussion  Barriers/Navigation Needs Coordination of Care  Interventions Coordination of Care  Coordination of Care Other  Acuity Level 1  Time Spent with Patient 15

## 2018-10-01 ENCOUNTER — Encounter: Payer: Self-pay | Admitting: *Deleted

## 2018-10-01 NOTE — Progress Notes (Signed)
Oncology Nurse Navigator Documentation  Oncology Nurse Navigator Flowsheets 10/01/2018  Navigator Location [facility]  Navigator Encounter Type Other/I called Kim with HIM to check on patient's appt with [facility].  I was unable to reach but did leave a vm message for her to call me with an update.   Treatment Phase Pre-Tx/Tx Discussion  Barriers/Navigation Needs Coordination of Care  Interventions Coordination of Care  Coordination of Care Other  Acuity Level 1  Time Spent with Patient 15

## 2018-10-06 ENCOUNTER — Telehealth: Payer: Self-pay | Admitting: Medical Oncology

## 2018-10-06 NOTE — Telephone Encounter (Signed)
Pt scheduled tomorrow

## 2018-10-07 ENCOUNTER — Telehealth: Payer: Self-pay | Admitting: Medical Oncology

## 2018-10-07 DIAGNOSIS — C457 Mesothelioma of other sites: Secondary | ICD-10-CM | POA: Diagnosis not present

## 2018-10-07 DIAGNOSIS — R0609 Other forms of dyspnea: Secondary | ICD-10-CM | POA: Diagnosis not present

## 2018-10-07 DIAGNOSIS — C459 Mesothelioma, unspecified: Secondary | ICD-10-CM | POA: Diagnosis not present

## 2018-10-07 DIAGNOSIS — R0789 Other chest pain: Secondary | ICD-10-CM | POA: Diagnosis not present

## 2018-10-07 DIAGNOSIS — R0602 Shortness of breath: Secondary | ICD-10-CM | POA: Diagnosis not present

## 2018-10-07 DIAGNOSIS — R05 Cough: Secondary | ICD-10-CM | POA: Diagnosis not present

## 2018-10-07 NOTE — Telephone Encounter (Signed)
Please schedule him an appointment next week with me or Cassie for discussion of the treatment and deciding on the day of his start.  Thank you.

## 2018-10-07 NOTE — Telephone Encounter (Addendum)
Calling for appt to start treatment. Pt saw Dr Wynelle Cleveland and recommendation to start tx here. Message to [place].

## 2018-10-08 ENCOUNTER — Telehealth: Payer: Self-pay | Admitting: *Deleted

## 2018-10-08 NOTE — Telephone Encounter (Signed)
Spoke to patient regarding appt with Dr. Julien Nordmann next week at request of Melissa X.-scheduler. Pt and wife thought that the plan was set for pt's treatment and did not understand why he was being scheduled with Dr. Julien Nordmann.  Explained to pt that Dr. Julien Nordmann will review information from pt's appt @ [facility] and then will set up treatment plan. Dr. Julien Nordmann will discuss the exact plan with pt and wife on Monday, 10/12/18.  Pt and wife voiced understanding

## 2018-10-12 ENCOUNTER — Inpatient Hospital Stay: Payer: Medicare Other

## 2018-10-12 ENCOUNTER — Encounter: Payer: Self-pay | Admitting: Internal Medicine

## 2018-10-12 ENCOUNTER — Inpatient Hospital Stay: Payer: Medicare Other | Attending: Internal Medicine | Admitting: Internal Medicine

## 2018-10-12 VITALS — BP 138/67 | HR 73 | Temp 98.4°F | Resp 17 | Ht 73.0 in | Wt 183.8 lb

## 2018-10-12 DIAGNOSIS — I10 Essential (primary) hypertension: Secondary | ICD-10-CM | POA: Diagnosis not present

## 2018-10-12 DIAGNOSIS — Z7189 Other specified counseling: Secondary | ICD-10-CM

## 2018-10-12 DIAGNOSIS — E785 Hyperlipidemia, unspecified: Secondary | ICD-10-CM

## 2018-10-12 DIAGNOSIS — Z79899 Other long term (current) drug therapy: Secondary | ICD-10-CM

## 2018-10-12 DIAGNOSIS — C450 Mesothelioma of pleura: Secondary | ICD-10-CM | POA: Diagnosis not present

## 2018-10-12 DIAGNOSIS — Z7901 Long term (current) use of anticoagulants: Secondary | ICD-10-CM

## 2018-10-12 DIAGNOSIS — Z5111 Encounter for antineoplastic chemotherapy: Secondary | ICD-10-CM

## 2018-10-12 DIAGNOSIS — J910 Malignant pleural effusion: Secondary | ICD-10-CM

## 2018-10-12 MED ORDER — DEXAMETHASONE 4 MG PO TABS: ORAL_TABLET | ORAL | 0 refills | Status: DC

## 2018-10-12 MED ORDER — CYANOCOBALAMIN 1000 MCG/ML IJ SOLN
1000.0000 ug | Freq: Once | INTRAMUSCULAR | Status: AC
  Administered 2018-10-12: 1000 ug via INTRAMUSCULAR

## 2018-10-12 MED ORDER — PROCHLORPERAZINE MALEATE 10 MG PO TABS: 10.0000 mg | ORAL_TABLET | Freq: Four times a day (QID) | ORAL | 0 refills | Status: DC | PRN

## 2018-10-12 MED ORDER — FOLIC ACID 1 MG PO TABS: 1.0000 mg | ORAL_TABLET | Freq: Every day | ORAL | 4 refills | Status: DC

## 2018-10-12 NOTE — Progress Notes (Signed)
[place] [phone]   [phone]  OFFICE PROGRESS NOTE  Lucille Passy, MD [place] [address]  DIAGNOSIS: Stage IV ([place], N2, M1) malignant pleural mesothelioma, epithelioid type diagnosed in January 2020 and presented with left hemithorax disease in addition to mediastinal lymphadenopathy and suspicious hypermetabolic upper abdominal nodes.  PRIOR THERAPY: None  CURRENT THERAPY: Systemic chemotherapy with cisplatin 75 mg/M2, Alimta 500 mg/M2 and Avastin 15 mg/KG every 3 weeks.  First dose October 20, 2018.   INTERVAL HISTORY: [name] 78 y.o. male returns to the clinic today for follow-up visit accompanied by his wife.  The patient is feeling fine today with no concerning complaints.  He denied having any chest pain but has intermittent shortness of breath increased with exertion with no cough or hemoptysis.  He denied having any fever or chills.  He has no nausea, vomiting, diarrhea or constipation.  He has no headache or visual changes.  He was seen recently by Dr. Wynelle Cleveland at [facility] for evaluation of surgical resection.  He was advised to proceed with at least 2 cycles of neoadjuvant systemic chemotherapy.  The patient is here today for evaluation and discussion of this option.  MEDICAL HISTORY: Past Medical History:  Diagnosis Date  . Atrial flutter ([place]) 03-2012  . Chronic back pain   . Headache(784.0)    migraines x 3 in life  . Hearing loss   . Hyperlipidemia   . Hypertension   . HYPOGONADISM, MALE 10/12/2007   Followup per urology    . Melanoma ([place])    melanoma left forearm 2005- no chemo  . PROSTATE CANCER, HX OF 10/06/2008   Status post surgery approximately 2010      ALLERGIES:  is allergic to eucalyptol and statins.  MEDICATIONS:  Current Outpatient Medications  Medication Sig Dispense Refill  . albuterol (PROVENTIL HFA;VENTOLIN HFA) 108 (90 Base) MCG/ACT inhaler Inhale 2 puffs into  the lungs every 6 (six) hours as needed for wheezing or shortness of breath. 1 Inhaler 6  . apixaban (ELIQUIS) 5 MG TABS tablet Take 1 tablet (5 mg total) by mouth 2 (two) times daily. 60 tablet 6  . fenofibrate 160 MG tablet TAKE 1 TABLET BY MOUTH  DAILY 90 tablet 2  . oxybutynin (DITROPAN-XL) 10 MG 24 hr tablet Take 10 mg by mouth daily.  11  . verapamil (CALAN-SR) 240 MG CR tablet TAKE 1 TABLET BY MOUTH AT  BEDTIME 90 tablet 3   No current facility-administered medications for this visit.     SURGICAL HISTORY:  Past Surgical History:  Procedure Laterality Date  . ABLATION  01-24-2014   repeat CTI ablation by Dr Lovena Le  . ABLATION OF DYSRHYTHMIC FOCUS  10/15/2012   CTI ablation by Dr Lovena Le for atrial flutter  . arhtroscopy  right knee  2010  . ATRIAL FLUTTER ABLATION N/A 10/15/2012   Procedure: ATRIAL FLUTTER ABLATION;  Surgeon: Evans Lance, MD;  Location: [facility] CATH LAB;  Service: Cardiovascular;  Laterality: N/A;  . ATRIAL FLUTTER ABLATION N/A 01/24/2014   Procedure: ATRIAL FLUTTER ABLATION;  Surgeon: Evans Lance, MD;  Location: [facility] CATH LAB;  Service: Cardiovascular;  Laterality: N/A;  . BACK SURGERY  07/2006   lumbar discectomy  . CARDIAC CATHETERIZATION     15 years ago  . CARDIOVERSION  05/18/2012   Procedure: CARDIOVERSION;  Surgeon: Peter M Martinique, MD;  Location: [place];  Service: Cardiovascular;  Laterality: N/A;  on eloquis on-going  . COLONOSCOPY W/ POLYPECTOMY    . LUMBAR LAMINECTOMY/DECOMPRESSION MICRODISCECTOMY  08/16/2011   Procedure: LUMBAR LAMINECTOMY/DECOMPRESSION MICRODISCECTOMY;  Surgeon: Dahlia Bailiff;  Location: [facility] ORS;  Service: Orthopedics;  Laterality: N/A;  Lumbar Decompression and Foraminotomy L4-S1    . MELANOMA EXCISION  12/13/03   [place] [facility]  . PROSTATECTOMY  07/2008  . ROTATOR CUFF REPAIR  2008   right; Dr Justice Britain  . TONSILLECTOMY  1947    REVIEW OF SYSTEMS:  Constitutional: positive for fatigue Eyes: negative Ears, nose, mouth,  throat, and face: negative Respiratory: positive for dyspnea on exertion Cardiovascular: negative Gastrointestinal: negative Genitourinary:negative Integument/breast: negative Hematologic/lymphatic: negative Musculoskeletal:negative Neurological: negative Behavioral/Psych: negative Endocrine: negative Allergic/Immunologic: negative   PHYSICAL EXAMINATION: General appearance: alert, cooperative, fatigued and no distress Head: Normocephalic, without obvious abnormality, atraumatic Neck: no adenopathy, no JVD, supple, symmetrical, trachea midline and thyroid not enlarged, symmetric, no tenderness/mass/nodules Lymph nodes: Cervical, supraclavicular, and axillary nodes normal. Resp: clear to auscultation bilaterally Back: symmetric, no curvature. ROM normal. No CVA tenderness. Cardio: regular rate and rhythm, S1, S2 normal, no murmur, click, rub or gallop GI: soft, non-tender; bowel sounds normal; no masses,  no organomegaly Extremities: extremities normal, atraumatic, no cyanosis or edema Neurologic: Alert and oriented X 3, normal strength and tone. Normal symmetric reflexes. Normal coordination and gait  ECOG PERFORMANCE STATUS: 1 - Symptomatic but completely ambulatory  Blood pressure 138/67, pulse 73, temperature 98.4 F (36.9 C), temperature source Oral, resp. rate 17, height 6\' 1"  (1.854 m), weight 183 lb 12.8 oz (83.4 kg), SpO2 96 %.  LABORATORY DATA: Lab Results  Component Value Date   WBC 7.2 09/17/2018   HGB 14.2 09/17/2018   HCT 42.7 09/17/2018   MCV 85.4 09/17/2018   PLT 293 09/17/2018      Chemistry      Component Value Date/Time   NA 139 09/17/2018 1334   K 4.2 09/17/2018 1334   CL 106 09/17/2018 1334   CO2 26 09/17/2018 1334   BUN 12 09/17/2018 1334   CREATININE 1.10 09/17/2018 1334      Component Value Date/Time   CALCIUM 9.3 09/17/2018 1334   ALKPHOS 55 09/17/2018 1334   AST 13 (L) 09/17/2018 1334   ALT 7 09/17/2018 1334   BILITOT 0.4 09/17/2018 1334        RADIOGRAPHIC STUDIES: No results found.  ASSESSMENT AND PLAN: This is a very pleasant 78 years old white male recently diagnosed with stage IV malignant pleural mesothelioma, epithelioid type diagnosed in January 2020.  The patient is here today for evaluation and discussion of neoadjuvant systemic chemotherapy. I recommended for the patient to proceed with chemotherapy in the form of cisplatin 75 mg/M2, Alimta 500 mg/M2 and Avastin 15 mg/KG every 3 weeks.  The patient will be treated for 2 cycles followed by re-staging scans and evaluation for surgical resection. I discussed with the patient the adverse effect of this treatment including but not limited to alopecia, myelosuppression, nausea and vomiting, peripheral neuropathy, liver or renal dysfunction in addition to the adverse effect of Avastin including hemorrhagic complications. The patient is expected to start the first cycle of this treatment next week. He will receive vitamin B12 injection today. I will call his pharmacy with prescription for Compazine 10 mg p.o. every 6 hours as needed for nausea, folic acid 1 mg p.o. daily, Decadron 4 mg p.o. twice daily the day before, day of and day after chemotherapy every 3 weeks. The patient will have a  chemotherapy education Castro before the first dose of his treatment. He will come back for follow-up visit in 4 weeks for evaluation before starting cycle #2. He was advised to call immediately if he has any concerning symptoms in the interval. The patient voices understanding of current disease status and treatment options and is in agreement with the current care plan.  All questions were answered. The patient knows to call the clinic with any problems, questions or concerns. We can certainly see the patient much sooner if necessary.  I spent 15 minutes counseling the patient face to face. The total time spent in the appointment was 25 minutes.  Disclaimer: This note was dictated with  voice recognition software. Similar sounding words can inadvertently be transcribed and may not be corrected upon review.

## 2018-10-12 NOTE — Progress Notes (Signed)
START OFF PATHWAY REGIMEN - Mesothelioma   [id number]:Bevacizumab + Cisplatin + Pemetrexed q21 days:   A cycle is every 21 days:     Bevacizumab-xxxx      Pemetrexed      Cisplatin   **Always confirm dose/schedule in your pharmacy ordering system**  Patient Characteristics: Newly Diagnosed, Preoperative or Nonsurgical Candidate (Clinical Staging), Distant Metastasis Therapeutic Status: Newly Diagnosed, Preoperative or Nonsurgical Candidate (Clinical Staging) AJCC T Category: cT3 AJCC N Category: cN2 AJCC 8 Stage Grouping: IV Resectability Status: Unresectable AJCC M Category: cM1 Intent of Therapy: Non-Curative / Palliative Intent, Discussed with Patient

## 2018-10-12 NOTE — Patient Instructions (Signed)
Cyanocobalamin, Vitamin B12 injection What is this medicine? CYANOCOBALAMIN (sye an oh koe BAL a min) is a man made form of vitamin B12. Vitamin B12 is used in the growth of healthy blood cells, nerve cells, and proteins in the body. It also helps with the metabolism of fats and carbohydrates. This medicine is used to treat people who can not absorb vitamin B12. This medicine may be used for other purposes; ask your health care provider or pharmacist if you have questions. COMMON BRAND NAME(S): B-12 Compliance Kit, B-12 Injection Kit, Cyomin, LA-12, Nutri-Twelve, Physicians EZ Use B-12, Primabalt What should I tell my health care provider before I take this medicine? They need to know if you have any of these conditions: -kidney disease -Leber's disease -megaloblastic anemia -an unusual or allergic reaction to cyanocobalamin, cobalt, other medicines, foods, dyes, or preservatives -pregnant or trying to get pregnant -breast-feeding How should I use this medicine? This medicine is injected into a muscle or deeply under the skin. It is usually given by a health care professional in a clinic or doctor's office. However, your doctor may teach you how to inject yourself. Follow all instructions. Talk to your pediatrician regarding the use of this medicine in children. Special care may be needed. Overdosage: If you think you have taken too much of this medicine contact a poison control center or emergency room at once. NOTE: This medicine is only for you. Do not share this medicine with others. What if I miss a dose? If you are given your dose at a clinic or doctor's office, call to reschedule your appointment. If you give your own injections and you miss a dose, take it as soon as you can. If it is almost time for your next dose, take only that dose. Do not take double or extra doses. What may interact with this medicine? -colchicine -heavy alcohol intake This list may not describe all possible  interactions. Give your health care provider a list of all the medicines, herbs, non-prescription drugs, or dietary supplements you use. Also tell them if you smoke, drink alcohol, or use illegal drugs. Some items may interact with your medicine. What should I watch for while using this medicine? Visit your doctor or health care professional regularly. You may need blood work done while you are taking this medicine. You may need to follow a special diet. Talk to your doctor. Limit your alcohol intake and avoid smoking to get the best benefit. What side effects may I notice from receiving this medicine? Side effects that you should report to your doctor or health care professional as soon as possible: -allergic reactions like skin rash, itching or hives, swelling of the face, lips, or tongue -blue tint to skin -chest tightness, pain -difficulty breathing, wheezing -dizziness -red, swollen painful area on the leg Side effects that usually do not require medical attention (report to your doctor or health care professional if they continue or are bothersome): -diarrhea -headache This list may not describe all possible side effects. Call your doctor for medical advice about side effects. You may report side effects to FDA at 1-800-FDA-1088. Where should I keep my medicine? Keep out of the reach of children. Store at room temperature between 15 and 30 degrees C (59 and 85 degrees F). Protect from light. Throw away any unused medicine after the expiration date. NOTE: This sheet is a summary. It may not cover all possible information. If you have questions about this medicine, talk to your doctor, pharmacist, or   health care provider.  2019 Elsevier/Gold Standard (2007-11-16 22:10:20)  

## 2018-10-13 ENCOUNTER — Telehealth: Payer: Self-pay | Admitting: Internal Medicine

## 2018-10-13 NOTE — Telephone Encounter (Signed)
Called patient to inform patient that their treatment has been added for 03/03.  Patient aware of appt time and date.

## 2018-10-16 ENCOUNTER — Inpatient Hospital Stay: Payer: Medicare Other

## 2018-10-16 ENCOUNTER — Encounter: Payer: Self-pay | Admitting: Internal Medicine

## 2018-10-16 NOTE — Progress Notes (Signed)
Called pt to introduce myself as his Arboriculturist and to discuss the [employer].  Unfortunately there aren't any foundations offering copay assistance for his Dx and the type of ins he has.  Left a msg requesting he return my call at his earliest convenience if he's interested in applying for the grant.

## 2018-10-19 ENCOUNTER — Other Ambulatory Visit: Payer: Medicare Other

## 2018-10-19 ENCOUNTER — Inpatient Hospital Stay: Payer: Medicare Other | Attending: Internal Medicine

## 2018-10-19 DIAGNOSIS — Z5111 Encounter for antineoplastic chemotherapy: Secondary | ICD-10-CM | POA: Diagnosis not present

## 2018-10-19 DIAGNOSIS — Z8546 Personal history of malignant neoplasm of prostate: Secondary | ICD-10-CM | POA: Insufficient documentation

## 2018-10-19 DIAGNOSIS — E291 Testicular hypofunction: Secondary | ICD-10-CM | POA: Insufficient documentation

## 2018-10-19 DIAGNOSIS — I4892 Unspecified atrial flutter: Secondary | ICD-10-CM | POA: Diagnosis not present

## 2018-10-19 DIAGNOSIS — E785 Hyperlipidemia, unspecified: Secondary | ICD-10-CM | POA: Diagnosis not present

## 2018-10-19 DIAGNOSIS — Z79899 Other long term (current) drug therapy: Secondary | ICD-10-CM | POA: Insufficient documentation

## 2018-10-19 DIAGNOSIS — I10 Essential (primary) hypertension: Secondary | ICD-10-CM | POA: Insufficient documentation

## 2018-10-19 DIAGNOSIS — C450 Mesothelioma of pleura: Secondary | ICD-10-CM | POA: Diagnosis not present

## 2018-10-19 DIAGNOSIS — Z7901 Long term (current) use of anticoagulants: Secondary | ICD-10-CM | POA: Diagnosis not present

## 2018-10-19 DIAGNOSIS — R110 Nausea: Secondary | ICD-10-CM | POA: Diagnosis not present

## 2018-10-19 LAB — CBC WITH DIFFERENTIAL (CANCER CENTER ONLY)
Abs Immature Granulocytes: 0.02 10*3/uL (ref 0.00–0.07)
Basophils Absolute: 0 10*3/uL (ref 0.0–0.1)
Basophils Relative: 1 %
Eosinophils Absolute: 0.1 10*3/uL (ref 0.0–0.5)
Eosinophils Relative: 1 %
HCT: 41.7 % (ref 39.0–52.0)
Hemoglobin: 13.6 g/dL (ref 13.0–17.0)
Immature Granulocytes: 0 %
Lymphocytes Relative: 10 %
Lymphs Abs: 0.8 10*3/uL (ref 0.7–4.0)
MCH: 27.8 pg (ref 26.0–34.0)
MCHC: 32.6 g/dL (ref 30.0–36.0)
MCV: 85.3 fL (ref 80.0–100.0)
MONOS PCT: 6 %
Monocytes Absolute: 0.4 10*3/uL (ref 0.1–1.0)
Neutro Abs: 6.3 10*3/uL (ref 1.7–7.7)
Neutrophils Relative %: 82 %
Platelet Count: 302 10*3/uL (ref 150–400)
RBC: 4.89 MIL/uL (ref 4.22–5.81)
RDW: 13.3 % (ref 11.5–15.5)
WBC Count: 7.6 10*3/uL (ref 4.0–10.5)
nRBC: 0 % (ref 0.0–0.2)

## 2018-10-19 LAB — CMP (CANCER CENTER ONLY)
ALBUMIN: 3 g/dL — AB (ref 3.5–5.0)
ALT: 9 U/L (ref 0–44)
AST: 19 U/L (ref 15–41)
Alkaline Phosphatase: 60 U/L (ref 38–126)
Anion gap: 10 (ref 5–15)
BUN: 16 mg/dL (ref 8–23)
CO2: 22 mmol/L (ref 22–32)
Calcium: 9 mg/dL (ref 8.9–10.3)
Chloride: 105 mmol/L (ref 98–111)
Creatinine: 1.16 mg/dL (ref 0.61–1.24)
GFR, Est AFR Am: >60 mL/min (ref >60)
GFR, Estimated: >60 mL/min (ref >60)
GLUCOSE: 139 mg/dL — AB (ref 70–99)
Potassium: 4.3 mmol/L (ref 3.5–5.1)
Sodium: 137 mmol/L (ref 135–145)
Total Bilirubin: 0.4 mg/dL (ref 0.3–1.2)
Total Protein: 7.2 g/dL (ref 6.5–8.1)

## 2018-10-19 LAB — MAGNESIUM: Magnesium: 1.9 mg/dL (ref 1.7–2.4)

## 2018-10-19 LAB — TOTAL PROTEIN, URINE DIPSTICK: Protein, ur: NEGATIVE mg/dL

## 2018-10-20 ENCOUNTER — Inpatient Hospital Stay: Payer: Medicare Other

## 2018-10-20 ENCOUNTER — Telehealth: Payer: Self-pay | Admitting: Medical Oncology

## 2018-10-20 ENCOUNTER — Other Ambulatory Visit: Payer: Self-pay

## 2018-10-20 ENCOUNTER — Encounter: Payer: Self-pay | Admitting: Internal Medicine

## 2018-10-20 VITALS — BP 131/71 | HR 78 | Temp 97.6°F | Resp 18

## 2018-10-20 DIAGNOSIS — I4892 Unspecified atrial flutter: Secondary | ICD-10-CM | POA: Diagnosis not present

## 2018-10-20 DIAGNOSIS — C450 Mesothelioma of pleura: Secondary | ICD-10-CM

## 2018-10-20 DIAGNOSIS — R110 Nausea: Secondary | ICD-10-CM | POA: Diagnosis not present

## 2018-10-20 DIAGNOSIS — Z5111 Encounter for antineoplastic chemotherapy: Secondary | ICD-10-CM | POA: Diagnosis not present

## 2018-10-20 DIAGNOSIS — E785 Hyperlipidemia, unspecified: Secondary | ICD-10-CM | POA: Diagnosis not present

## 2018-10-20 DIAGNOSIS — Z7901 Long term (current) use of anticoagulants: Secondary | ICD-10-CM | POA: Diagnosis not present

## 2018-10-20 DIAGNOSIS — I10 Essential (primary) hypertension: Secondary | ICD-10-CM | POA: Diagnosis not present

## 2018-10-20 DIAGNOSIS — Z79899 Other long term (current) drug therapy: Secondary | ICD-10-CM | POA: Diagnosis not present

## 2018-10-20 MED ORDER — PALONOSETRON HCL INJECTION 0.25 MG/5ML
0.2500 mg | Freq: Once | INTRAVENOUS | Status: AC
  Administered 2018-10-20: 0.25 mg via INTRAVENOUS

## 2018-10-20 MED ORDER — SODIUM CHLORIDE 0.9 % IV SOLN
75.0000 mg/m2 | Freq: Once | INTRAVENOUS | Status: AC
  Administered 2018-10-20: 155 mg via INTRAVENOUS
  Filled 2018-10-20: qty 155

## 2018-10-20 MED ORDER — PALONOSETRON HCL INJECTION 0.25 MG/5ML
INTRAVENOUS | Status: AC
  Filled 2018-10-20: qty 5

## 2018-10-20 MED ORDER — POTASSIUM CHLORIDE 2 MEQ/ML IV SOLN
Freq: Once | INTRAVENOUS | Status: AC
  Administered 2018-10-20: 10:00:00 via INTRAVENOUS
  Filled 2018-10-20: qty 10

## 2018-10-20 MED ORDER — SODIUM CHLORIDE 0.9 % IV SOLN
485.0000 mg/m2 | Freq: Once | INTRAVENOUS | Status: AC
  Administered 2018-10-20: 1000 mg via INTRAVENOUS
  Filled 2018-10-20: qty 40

## 2018-10-20 MED ORDER — SODIUM CHLORIDE 0.9 % IV SOLN
15.5000 mg/kg | Freq: Once | INTRAVENOUS | Status: AC
  Administered 2018-10-20: 1300 mg via INTRAVENOUS
  Filled 2018-10-20: qty 4

## 2018-10-20 MED ORDER — SODIUM CHLORIDE 0.9 % IV SOLN
Freq: Once | INTRAVENOUS | Status: AC
  Administered 2018-10-20: 12:00:00 via INTRAVENOUS
  Filled 2018-10-20: qty 5

## 2018-10-20 MED ORDER — SODIUM CHLORIDE 0.9 % IV SOLN
INTRAVENOUS | Status: DC
  Administered 2018-10-20: 08:00:00 via INTRAVENOUS
  Filled 2018-10-20: qty 250

## 2018-10-20 NOTE — Progress Notes (Signed)
Spoke w/ pt's wife and informed her about the [employer], went over what it covers and gave her the income requirement.  She stated they gross more than the requirement so he doesn't qualify for the grant at this time.  I also informed her that I researched outside foundations for copay assistance but unfortunately there aren't any foundations with funding available for his Dx and the type of ins he has.  She thanked me for looking into it.

## 2018-10-20 NOTE — Telephone Encounter (Signed)
Dr Lovena Le reviewed EKG. Per [name] , pt needs to stay on verapamil and blood thinner and it is okay to treat pt today with cisplatin, alimta an avastin.

## 2018-10-20 NOTE — Progress Notes (Signed)
0810 Pt's pulse ranging from 50's -150's. Dr. Julien Nordmann notified and EKG ordered.   EKG review by. Dr. Julien Nordmann and also Pt's own Cardiologist.   [phone] Pt instructed to stay on his medications as prescribed and ok to procede w/ treatment today.

## 2018-10-20 NOTE — Patient Instructions (Signed)
[facility] Discharge Instructions for Patients Receiving Chemotherapy  Today you received the following chemotherapy agents Cisplatin,Pemetrexed (Alimta), Bevacizumab (Avastin)  To help prevent nausea and vomiting after your treatment, we encourage you to take your nausea medication as directed.   If you develop nausea and vomiting that is not controlled by your nausea medication, call the clinic.   BELOW ARE SYMPTOMS THAT SHOULD BE REPORTED IMMEDIATELY:  *FEVER GREATER THAN 100.5 F  *CHILLS WITH OR WITHOUT FEVER  NAUSEA AND VOMITING THAT IS NOT CONTROLLED WITH YOUR NAUSEA MEDICATION  *UNUSUAL SHORTNESS OF BREATH  *UNUSUAL BRUISING OR BLEEDING  TENDERNESS IN MOUTH AND THROAT WITH OR WITHOUT PRESENCE OF ULCERS  *URINARY PROBLEMS  *BOWEL PROBLEMS  UNUSUAL RASH Items with * indicate a potential emergency and should be followed up as soon as possible.  Feel free to call the clinic should you have any questions or concerns. The clinic phone number is [phone].  Please show the [facility] at check-in to the Emergency Department and triage nurse.  Cisplatin injection What is this medicine? CISPLATIN (SIS pla tin) is a chemotherapy drug. It targets fast dividing cells, like cancer cells, and causes these cells to die. This medicine is used to treat many types of cancer like bladder, ovarian, and testicular cancers. This medicine may be used for other purposes; ask your health care provider or pharmacist if you have questions. COMMON BRAND NAME(S): Platinol, Platinol -AQ What should I tell my health care provider before I take this medicine? They need to know if you have any of these conditions: -blood disorders -hearing problems -kidney disease -recent or ongoing radiation therapy -an unusual or allergic reaction to cisplatin, carboplatin, other chemotherapy, other medicines, foods, dyes, or preservatives -pregnant or trying to get  pregnant -breast-feeding How should I use this medicine? This drug is given as an infusion into a vein. It is administered in a hospital or clinic by a specially trained health care professional. Talk to your pediatrician regarding the use of this medicine in children. Special care may be needed. Overdosage: If you think you have taken too much of this medicine contact a poison control center or emergency room at once. NOTE: This medicine is only for you. Do not share this medicine with others. What if I miss a dose? It is important not to miss a dose. Call your doctor or health care professional if you are unable to keep an appointment. What may interact with this medicine? -dofetilide -foscarnet -medicines for seizures -medicines to increase blood counts like filgrastim, pegfilgrastim, sargramostim -probenecid -pyridoxine used with altretamine -rituximab -some antibiotics like amikacin, gentamicin, neomycin, polymyxin B, streptomycin, tobramycin -sulfinpyrazone -vaccines -zalcitabine Talk to your doctor or health care professional before taking any of these medicines: -acetaminophen -aspirin -ibuprofen -ketoprofen -naproxen This list may not describe all possible interactions. Give your health care provider a list of all the medicines, herbs, non-prescription drugs, or dietary supplements you use. Also tell them if you smoke, drink alcohol, or use illegal drugs. Some items may interact with your medicine. What should I watch for while using this medicine? Your condition will be monitored carefully while you are receiving this medicine. You will need important blood work done while you are taking this medicine. This drug may make you feel generally unwell. This is not uncommon, as chemotherapy can affect healthy cells as well as cancer cells. Report any side effects. Continue your course of treatment even though you feel ill unless your doctor tells  you to stop. In some cases, you may  be given additional medicines to help with side effects. Follow all directions for their use. Call your doctor or health care professional for advice if you get a fever, chills or sore throat, or other symptoms of a cold or flu. Do not treat yourself. This drug decreases your body's ability to fight infections. Try to avoid being around people who are sick. This medicine may increase your risk to bruise or bleed. Call your doctor or health care professional if you notice any unusual bleeding. Be careful brushing and flossing your teeth or using a toothpick because you may get an infection or bleed more easily. If you have any dental work done, tell your dentist you are receiving this medicine. Avoid taking products that contain aspirin, acetaminophen, ibuprofen, naproxen, or ketoprofen unless instructed by your doctor. These medicines may hide a fever. Do not become pregnant while taking this medicine. Women should inform their doctor if they wish to become pregnant or think they might be pregnant. There is a potential for serious side effects to an unborn child. Talk to your health care professional or pharmacist for more information. Do not breast-feed an infant while taking this medicine. Drink fluids as directed while you are taking this medicine. This will help protect your kidneys. Call your doctor or health care professional if you get diarrhea. Do not treat yourself. What side effects may I notice from receiving this medicine? Side effects that you should report to your doctor or health care professional as soon as possible: -allergic reactions like skin rash, itching or hives, swelling of the face, lips, or tongue -signs of infection - fever or chills, cough, sore throat, pain or difficulty passing urine -signs of decreased platelets or bleeding - bruising, pinpoint red spots on the skin, black, tarry stools, nosebleeds -signs of decreased red blood cells - unusually weak or tired, fainting  spells, lightheadedness -breathing problems -changes in hearing -gout pain -low blood counts - This drug may decrease the number of white blood cells, red blood cells and platelets. You may be at increased risk for infections and bleeding. -nausea and vomiting -pain, swelling, redness or irritation at the injection site -pain, tingling, numbness in the hands or feet -problems with balance, movement -trouble passing urine or change in the amount of urine Side effects that usually do not require medical attention (report to your doctor or health care professional if they continue or are bothersome): -changes in vision -loss of appetite -metallic taste in the mouth or changes in taste This list may not describe all possible side effects. Call your doctor for medical advice about side effects. You may report side effects to FDA at 1-800-FDA-1088. Where should I keep my medicine? This drug is given in a hospital or clinic and will not be stored at home. NOTE: This sheet is a summary. It may not cover all possible information. If you have questions about this medicine, talk to your doctor, pharmacist, or health care provider.  2019 Elsevier/Gold Standard (2007-11-10 14:40:54)  Pemetrexed injection (Alimta) What is this medicine? PEMETREXED (PEM e TREX ed) is a chemotherapy drug used to treat lung cancers like non-small cell lung cancer and mesothelioma. It may also be used to treat other cancers. This medicine may be used for other purposes; ask your health care provider or pharmacist if you have questions. COMMON BRAND NAME(S): Alimta What should I tell my health care provider before I take this medicine? They need to  know if you have any of these conditions: -infection (especially a virus infection such as chickenpox, cold sores, or herpes) -kidney disease -low blood counts, like low white cell, platelet, or red cell counts -lung or breathing disease, like asthma -radiation therapy -an  unusual or allergic reaction to pemetrexed, other medicines, foods, dyes, or preservative -pregnant or trying to get pregnant -breast-feeding How should I use this medicine? This drug is given as an infusion into a vein. It is administered in a hospital or clinic by a specially trained health care professional. Talk to your pediatrician regarding the use of this medicine in children. Special care may be needed. Overdosage: If you think you have taken too much of this medicine contact a poison control center or emergency room at once. NOTE: This medicine is only for you. Do not share this medicine with others. What if I miss a dose? It is important not to miss your dose. Call your doctor or health care professional if you are unable to keep an appointment. What may interact with this medicine? This medicine may interact with the following medications: -Ibuprofen This list may not describe all possible interactions. Give your health care provider a list of all the medicines, herbs, non-prescription drugs, or dietary supplements you use. Also tell them if you smoke, drink alcohol, or use illegal drugs. Some items may interact with your medicine. What should I watch for while using this medicine? Visit your doctor for checks on your progress. This drug may make you feel generally unwell. This is not uncommon, as chemotherapy can affect healthy cells as well as cancer cells. Report any side effects. Continue your course of treatment even though you feel ill unless your doctor tells you to stop. In some cases, you may be given additional medicines to help with side effects. Follow all directions for their use. Call your doctor or health care professional for advice if you get a fever, chills or sore throat, or other symptoms of a cold or flu. Do not treat yourself. This drug decreases your body's ability to fight infections. Try to avoid being around people who are sick. This medicine may increase your  risk to bruise or bleed. Call your doctor or health care professional if you notice any unusual bleeding. Be careful brushing and flossing your teeth or using a toothpick because you may get an infection or bleed more easily. If you have any dental work done, tell your dentist you are receiving this medicine. Avoid taking products that contain aspirin, acetaminophen, ibuprofen, naproxen, or ketoprofen unless instructed by your doctor. These medicines may hide a fever. Call your doctor or health care professional if you get diarrhea or mouth sores. Do not treat yourself. To protect your kidneys, drink water or other fluids as directed while you are taking this medicine. Do not become pregnant while taking this medicine or for 6 months after stopping it. Women should inform their doctor if they wish to become pregnant or think they might be pregnant. Men should not father a child while taking this medicine and for 3 months after stopping it. This may interfere with the ability to father a child. You should talk to your doctor or health care professional if you are concerned about your fertility. There is a potential for serious side effects to an unborn child. Talk to your health care professional or pharmacist for more information. Do not breast-feed an infant while taking this medicine or for 1 week after stopping it. What side  effects may I notice from receiving this medicine? Side effects that you should report to your doctor or health care professional as soon as possible: -allergic reactions like skin rash, itching or hives, swelling of the face, lips, or tongue -breathing problems -redness, blistering, peeling or loosening of the skin, including inside the mouth -signs and symptoms of bleeding such as bloody or black, tarry stools; red or dark-brown urine; spitting up blood or brown material that looks like coffee grounds; red spots on the skin; unusual bruising or bleeding from the eye, gums, or  nose -signs and symptoms of infection like fever or chills; cough; sore throat; pain or trouble passing urine -signs and symptoms of kidney injury like trouble passing urine or change in the amount of urine -signs and symptoms of liver injury like dark yellow or brown urine; general ill feeling or flu-like symptoms; light-colored stools; loss of appetite; nausea; right upper belly pain; unusually weak or tired; yellowing of the eyes or skin Side effects that usually do not require medical attention (report to your doctor or health care professional if they continue or are bothersome): -constipation -mouth sores -nausea, vomiting -unusually weak or tired This list may not describe all possible side effects. Call your doctor for medical advice about side effects. You may report side effects to FDA at 1-800-FDA-1088. Where should I keep my medicine? This drug is given in a hospital or clinic and will not be stored at home. NOTE: This sheet is a summary. It may not cover all possible information. If you have questions about this medicine, talk to your doctor, pharmacist, or health care provider.  2019 Elsevier/Gold Standard (2017-09-24 16:11:33)  Bevacizumab injection (Avastin) What is this medicine? BEVACIZUMAB (be va SIZ yoo mab) is a monoclonal antibody. It is used to treat many types of cancer. This medicine may be used for other purposes; ask your health care provider or pharmacist if you have questions. COMMON BRAND NAME(S): Avastin, MVASI What should I tell my health care provider before I take this medicine? They need to know if you have any of these conditions: -diabetes -heart disease -high blood pressure -history of coughing up blood -prior anthracycline chemotherapy (e.g., doxorubicin, daunorubicin, epirubicin) -recent or ongoing radiation therapy -recent or planning to have surgery -stroke -an unusual or allergic reaction to bevacizumab, hamster proteins, mouse proteins, other  medicines, foods, dyes, or preservatives -pregnant or trying to get pregnant -breast-feeding How should I use this medicine? This medicine is for infusion into a vein. It is given by a health care professional in a hospital or clinic setting. Talk to your pediatrician regarding the use of this medicine in children. Special care may be needed. Overdosage: If you think you have taken too much of this medicine contact a poison control center or emergency room at once. NOTE: This medicine is only for you. Do not share this medicine with others. What if I miss a dose? It is important not to miss your dose. Call your doctor or health care professional if you are unable to keep an appointment. What may interact with this medicine? Interactions are not expected. This list may not describe all possible interactions. Give your health care provider a list of all the medicines, herbs, non-prescription drugs, or dietary supplements you use. Also tell them if you smoke, drink alcohol, or use illegal drugs. Some items may interact with your medicine. What should I watch for while using this medicine? Your condition will be monitored carefully while you are receiving  this medicine. You will need important blood work and urine testing done while you are taking this medicine. This medicine may increase your risk to bruise or bleed. Call your doctor or health care professional if you notice any unusual bleeding. This medicine should be started at least 28 days following major surgery and the site of the surgery should be totally healed. Check with your doctor before scheduling dental work or surgery while you are receiving this treatment. Talk to your doctor if you have recently had surgery or if you have a wound that has not healed. Do not become pregnant while taking this medicine or for 6 months after stopping it. Women should inform their doctor if they wish to become pregnant or think they might be pregnant.  There is a potential for serious side effects to an unborn child. Talk to your health care professional or pharmacist for more information. Do not breast-feed an infant while taking this medicine and for 6 months after the last dose. This medicine has caused ovarian failure in some women. This medicine may interfere with the ability to have a child. You should talk to your doctor or health care professional if you are concerned about your fertility. What side effects may I notice from receiving this medicine? Side effects that you should report to your doctor or health care professional as soon as possible: -allergic reactions like skin rash, itching or hives, swelling of the face, lips, or tongue -chest pain or chest tightness -chills -coughing up blood -high fever -seizures -severe constipation -signs and symptoms of bleeding such as bloody or black, tarry stools; red or dark-brown urine; spitting up blood or brown material that looks like coffee grounds; red spots on the skin; unusual bruising or bleeding from the eye, gums, or nose -signs and symptoms of a blood clot such as breathing problems; chest pain; severe, sudden headache; pain, swelling, warmth in the leg -signs and symptoms of a stroke like changes in vision; confusion; trouble speaking or understanding; severe headaches; sudden numbness or weakness of the face, arm or leg; trouble walking; dizziness; loss of balance or coordination -stomach pain -sweating -swelling of legs or ankles -vomiting -weight gain Side effects that usually do not require medical attention (report to your doctor or health care professional if they continue or are bothersome): -back pain -changes in taste -decreased appetite -dry skin -nausea -tiredness This list may not describe all possible side effects. Call your doctor for medical advice about side effects. You may report side effects to FDA at 1-800-FDA-1088. Where should I keep my medicine? This  drug is given in a hospital or clinic and will not be stored at home. NOTE: This sheet is a summary. It may not cover all possible information. If you have questions about this medicine, talk to your doctor, pharmacist, or health care provider.  2019 Elsevier/Gold Standard (2016-08-02 14:33:29)

## 2018-10-22 ENCOUNTER — Telehealth: Payer: Self-pay | Admitting: Medical Oncology

## 2018-10-22 NOTE — Telephone Encounter (Signed)
Pressure under stomach  -constipated took stool softener and had BM today. It came back after eating protein drink. Like reflux and needs to belch. He took gas-x . I instructed him to drink coca cola.

## 2018-10-26 ENCOUNTER — Inpatient Hospital Stay: Payer: Medicare Other

## 2018-10-26 DIAGNOSIS — E785 Hyperlipidemia, unspecified: Secondary | ICD-10-CM | POA: Diagnosis not present

## 2018-10-26 DIAGNOSIS — Z79899 Other long term (current) drug therapy: Secondary | ICD-10-CM | POA: Diagnosis not present

## 2018-10-26 DIAGNOSIS — I10 Essential (primary) hypertension: Secondary | ICD-10-CM | POA: Diagnosis not present

## 2018-10-26 DIAGNOSIS — Z5111 Encounter for antineoplastic chemotherapy: Secondary | ICD-10-CM | POA: Diagnosis not present

## 2018-10-26 DIAGNOSIS — Z7901 Long term (current) use of anticoagulants: Secondary | ICD-10-CM | POA: Diagnosis not present

## 2018-10-26 DIAGNOSIS — C450 Mesothelioma of pleura: Secondary | ICD-10-CM | POA: Diagnosis not present

## 2018-10-26 DIAGNOSIS — I4892 Unspecified atrial flutter: Secondary | ICD-10-CM | POA: Diagnosis not present

## 2018-10-26 DIAGNOSIS — R110 Nausea: Secondary | ICD-10-CM | POA: Diagnosis not present

## 2018-10-26 LAB — COMPREHENSIVE METABOLIC PANEL
ALBUMIN: 3.2 g/dL — AB (ref 3.5–5.0)
ALT: 14 U/L (ref 0–44)
AST: 20 U/L (ref 15–41)
Alkaline Phosphatase: 52 U/L (ref 38–126)
Anion gap: 13 (ref 5–15)
BUN: 26 mg/dL — ABNORMAL HIGH (ref 8–23)
CALCIUM: 8.7 mg/dL — AB (ref 8.9–10.3)
CHLORIDE: 99 mmol/L (ref 98–111)
CO2: 21 mmol/L — ABNORMAL LOW (ref 22–32)
Creatinine, Ser: 1.55 mg/dL — ABNORMAL HIGH (ref 0.61–1.24)
GFR calc Af Amer: 49 mL/min — ABNORMAL LOW (ref >60)
GFR calc non Af Amer: 43 mL/min — ABNORMAL LOW (ref >60)
Glucose, Bld: 133 mg/dL — ABNORMAL HIGH (ref 70–99)
Potassium: 4.2 mmol/L (ref 3.5–5.1)
SODIUM: 133 mmol/L — AB (ref 135–145)
Total Bilirubin: 0.6 mg/dL (ref 0.3–1.2)
Total Protein: 6.5 g/dL (ref 6.5–8.1)

## 2018-10-26 LAB — CBC WITH DIFFERENTIAL (CANCER CENTER ONLY)
Abs Immature Granulocytes: 0.02 10*3/uL (ref 0.00–0.07)
BASOS ABS: 0 10*3/uL (ref 0.0–0.1)
Basophils Relative: 0 %
Eosinophils Absolute: 0.1 10*3/uL (ref 0.0–0.5)
Eosinophils Relative: 1 %
HCT: 47.6 % (ref 39.0–52.0)
Hemoglobin: 15.7 g/dL (ref 13.0–17.0)
Immature Granulocytes: 0 %
Lymphocytes Relative: 17 %
Lymphs Abs: 1 10*3/uL (ref 0.7–4.0)
MCH: 27.5 pg (ref 26.0–34.0)
MCHC: 33 g/dL (ref 30.0–36.0)
MCV: 83.4 fL (ref 80.0–100.0)
Monocytes Absolute: 0.3 10*3/uL (ref 0.1–1.0)
Monocytes Relative: 5 %
NEUTROS ABS: 4.7 10*3/uL (ref 1.7–7.7)
Neutrophils Relative %: 77 %
Platelet Count: 311 10*3/uL (ref 150–400)
RBC: 5.71 MIL/uL (ref 4.22–5.81)
RDW: 13.2 % (ref 11.5–15.5)
WBC: 6.1 10*3/uL (ref 4.0–10.5)
nRBC: 0 % (ref 0.0–0.2)

## 2018-10-26 LAB — MAGNESIUM: Magnesium: 1.8 mg/dL (ref 1.7–2.4)

## 2018-10-28 ENCOUNTER — Encounter: Payer: Self-pay | Admitting: Medical Oncology

## 2018-10-28 ENCOUNTER — Telehealth: Payer: Self-pay | Admitting: Medical Oncology

## 2018-10-28 ENCOUNTER — Encounter: Payer: Self-pay | Admitting: Internal Medicine

## 2018-10-28 NOTE — Telephone Encounter (Signed)
Martin Bears, MD  Ardeen Garland, RN        Please advise increase hydration because of his kidney function is getting a little bit lower.    Called to pt .

## 2018-11-02 ENCOUNTER — Other Ambulatory Visit: Payer: Self-pay

## 2018-11-02 ENCOUNTER — Inpatient Hospital Stay: Payer: Medicare Other

## 2018-11-02 DIAGNOSIS — C450 Mesothelioma of pleura: Secondary | ICD-10-CM

## 2018-11-02 DIAGNOSIS — I10 Essential (primary) hypertension: Secondary | ICD-10-CM | POA: Diagnosis not present

## 2018-11-02 DIAGNOSIS — Z5111 Encounter for antineoplastic chemotherapy: Secondary | ICD-10-CM | POA: Diagnosis not present

## 2018-11-02 DIAGNOSIS — Z7901 Long term (current) use of anticoagulants: Secondary | ICD-10-CM | POA: Diagnosis not present

## 2018-11-02 DIAGNOSIS — Z79899 Other long term (current) drug therapy: Secondary | ICD-10-CM | POA: Diagnosis not present

## 2018-11-02 DIAGNOSIS — E785 Hyperlipidemia, unspecified: Secondary | ICD-10-CM | POA: Diagnosis not present

## 2018-11-02 DIAGNOSIS — I4892 Unspecified atrial flutter: Secondary | ICD-10-CM | POA: Diagnosis not present

## 2018-11-02 DIAGNOSIS — R110 Nausea: Secondary | ICD-10-CM | POA: Diagnosis not present

## 2018-11-02 DIAGNOSIS — J90 Pleural effusion, not elsewhere classified: Secondary | ICD-10-CM

## 2018-11-02 LAB — CBC WITH DIFFERENTIAL (CANCER CENTER ONLY)
Abs Immature Granulocytes: 0.01 10*3/uL (ref 0.00–0.07)
BASOS PCT: 0 %
Basophils Absolute: 0 10*3/uL (ref 0.0–0.1)
Eosinophils Absolute: 0.1 10*3/uL (ref 0.0–0.5)
Eosinophils Relative: 3 %
HEMATOCRIT: 38.5 % — AB (ref 39.0–52.0)
Hemoglobin: 12.8 g/dL — ABNORMAL LOW (ref 13.0–17.0)
Immature Granulocytes: 0 %
Lymphocytes Relative: 22 %
Lymphs Abs: 0.7 10*3/uL (ref 0.7–4.0)
MCH: 28.3 pg (ref 26.0–34.0)
MCHC: 33.2 g/dL (ref 30.0–36.0)
MCV: 85.2 fL (ref 80.0–100.0)
Monocytes Absolute: 0.4 10*3/uL (ref 0.1–1.0)
Monocytes Relative: 14 %
Neutro Abs: 1.9 10*3/uL (ref 1.7–7.7)
Neutrophils Relative %: 61 %
Platelet Count: 177 10*3/uL (ref 150–400)
RBC: 4.52 MIL/uL (ref 4.22–5.81)
RDW: 13.6 % (ref 11.5–15.5)
WBC: 3.2 10*3/uL — AB (ref 4.0–10.5)
nRBC: 0 % (ref 0.0–0.2)

## 2018-11-02 LAB — CMP (CANCER CENTER ONLY)
ALT: 13 U/L (ref 0–44)
AST: 19 U/L (ref 15–41)
Albumin: 2.8 g/dL — ABNORMAL LOW (ref 3.5–5.0)
Alkaline Phosphatase: 66 U/L (ref 38–126)
Anion gap: 10 (ref 5–15)
BUN: 15 mg/dL (ref 8–23)
CO2: 23 mmol/L (ref 22–32)
Calcium: 8.4 mg/dL — ABNORMAL LOW (ref 8.9–10.3)
Chloride: 104 mmol/L (ref 98–111)
Creatinine: 1.19 mg/dL (ref 0.61–1.24)
GFR, Est AFR Am: >60 mL/min (ref >60)
GFR, Estimated: 59 mL/min — ABNORMAL LOW (ref >60)
Glucose, Bld: 95 mg/dL (ref 70–99)
POTASSIUM: 4 mmol/L (ref 3.5–5.1)
Sodium: 137 mmol/L (ref 135–145)
TOTAL PROTEIN: 6 g/dL — AB (ref 6.5–8.1)
Total Bilirubin: 0.4 mg/dL (ref 0.3–1.2)

## 2018-11-02 LAB — MAGNESIUM: Magnesium: 1.6 mg/dL — ABNORMAL LOW (ref 1.7–2.4)

## 2018-11-03 ENCOUNTER — Ambulatory Visit (INDEPENDENT_AMBULATORY_CARE_PROVIDER_SITE_OTHER): Payer: Medicare Other | Admitting: Family Medicine

## 2018-11-03 ENCOUNTER — Encounter: Payer: Self-pay | Admitting: Family Medicine

## 2018-11-03 DIAGNOSIS — J0100 Acute maxillary sinusitis, unspecified: Secondary | ICD-10-CM | POA: Diagnosis not present

## 2018-11-03 MED ORDER — AMOXICILLIN-POT CLAVULANATE 875-125 MG PO TABS: 1.0000 | ORAL_TABLET | Freq: Two times a day (BID) | ORAL | 0 refills | Status: DC

## 2018-11-03 NOTE — Progress Notes (Signed)
[name] - 78 y.o. male MRN [id number]  Date of birth: October 22, 1940  Subjective Chief Complaint  Patient presents with  . URI    Sinus pain/pressure, Onset : 4 days ago    HPI JAMORRIS NDIAYE is a 78 y.o. male here today with complaint of R sided maxillary sinus pain, headache and bloody streaked nasal discharge.  He reports that symptoms began 4 days ago.  He denies fever, chills, cough, shortness of breath, sore throat, rash.   He is receiving chemotherapy for treatment of mesothelioma.  He has not tried anything for treatment at home.   ROS:  A comprehensive ROS was completed and negative except as noted per HPI  Allergies  Allergen Reactions  . Eucalyptol     Dried Eucalytus:  Head stops up  . Statins Other (See Comments)    Body aches, memory loss    Past Medical History:  Diagnosis Date  . Atrial flutter ([place]) 03-2012  . Chronic back pain   . Headache(784.0)    migraines x 3 in life  . Hearing loss   . Hyperlipidemia   . Hypertension   . HYPOGONADISM, MALE 10/12/2007   Followup per urology    . Melanoma ([place])    melanoma left forearm 2005- no chemo  . PROSTATE CANCER, HX OF 10/06/2008   Status post surgery approximately 2010      Past Surgical History:  Procedure Laterality Date  . ABLATION  01-24-2014   repeat CTI ablation by Dr Lovena Le  . ABLATION OF DYSRHYTHMIC FOCUS  10/15/2012   CTI ablation by Dr Lovena Le for atrial flutter  . arhtroscopy  right knee  2010  . ATRIAL FLUTTER ABLATION N/A 10/15/2012   Procedure: ATRIAL FLUTTER ABLATION;  Surgeon: Evans Lance, MD;  Location: [facility] CATH LAB;  Service: Cardiovascular;  Laterality: N/A;  . ATRIAL FLUTTER ABLATION N/A 01/24/2014   Procedure: ATRIAL FLUTTER ABLATION;  Surgeon: Evans Lance, MD;  Location: [facility] CATH LAB;  Service: Cardiovascular;  Laterality: N/A;  . BACK SURGERY  07/2006   lumbar discectomy  . CARDIAC CATHETERIZATION     15 years ago  . CARDIOVERSION  05/18/2012   Procedure: CARDIOVERSION;  Surgeon:  Peter M Martinique, MD;  Location: [facility] ENDOSCOPY;  Service: Cardiovascular;  Laterality: N/A;  on eloquis on-going  . COLONOSCOPY W/ POLYPECTOMY    . LUMBAR LAMINECTOMY/DECOMPRESSION MICRODISCECTOMY  08/16/2011   Procedure: LUMBAR LAMINECTOMY/DECOMPRESSION MICRODISCECTOMY;  Surgeon: Dahlia Bailiff;  Location: [facility] ORS;  Service: Orthopedics;  Laterality: N/A;  Lumbar Decompression and Foraminotomy L4-S1    . MELANOMA EXCISION  12/13/03   [place] [facility]  . PROSTATECTOMY  07/2008  . ROTATOR CUFF REPAIR  2008   right; Dr Justice Britain  . TONSILLECTOMY  1947    Social History   Socioeconomic History  . Marital status: Married    Spouse name: Not on file  . Number of children: 2  . Years of education: Not on file  . Highest education level: Not on file  Occupational History  . Occupation: Film/video editor  Social Needs  . Financial resource strain: Not on file  . Food insecurity:    Worry: Not on file    Inability: Not on file  . Transportation needs:    Medical: Not on file    Non-medical: Not on file  Tobacco Use  . Smoking status: Former Smoker    Packs/day: 1.00    Years: 20.00    Pack years: 20.00  Types: Cigarettes    Last attempt to quit: 08/08/1984    Years since quitting: 34.2  . Smokeless tobacco: Never Used  Substance and Sexual Activity  . Alcohol use: Yes    Alcohol/week: 2.0 standard drinks    Types: 2 Cans of beer per week    Comment: socially   2 beer or mixed drinks week  . Drug use: No  . Sexual activity: Never  Lifestyle  . Physical activity:    Days per week: Not on file    Minutes per session: Not on file  . Stress: Not on file  Relationships  . Social connections:    Talks on phone: Not on file    Gets together: Not on file    Attends religious service: Not on file    Active member of club or organization: Not on file    Attends meetings of clubs or organizations: Not on file    Relationship status: Not on file  Other Topics Concern  .  Not on file  Social History Narrative   in [place] since '84   Desires CPR    Family History  Problem Relation Age of Onset  . Pancreatic cancer Mother   . Colon cancer Brother   . Esophageal cancer Neg Hx   . Stomach cancer Neg Hx   . Rectal cancer Neg Hx     Health Maintenance  Topic Date Due  . COLONOSCOPY  05/06/2018  . TETANUS/TDAP  03/21/2025  . INFLUENZA VACCINE  Completed  . PNA vac Low Risk Adult  Completed    ----------------------------------------------------------------------------------------------------------------------------------------------------------------------------------------------------------------- Physical Exam BP 120/70   Pulse (!) 54   Temp (!) 97.5 F (36.4 C) (Oral)   Ht 6' (1.829 m)   Wt 172 lb (78 kg)   SpO2 94%   BMI 23.33 kg/m   Physical Exam Constitutional:      Appearance: Normal appearance.  HENT:     Head: Normocephalic and atraumatic.     Right Ear: Tympanic membrane normal.     Left Ear: Tympanic membrane normal.     Nose:     Comments: + Right maxillary sinus pain.      Mouth/Throat:     Mouth: Mucous membranes are moist.  Eyes:     General: No scleral icterus. Neck:     Musculoskeletal: Neck supple.  Cardiovascular:     Rate and Rhythm: Normal rate.     Pulses: Normal pulses.     Heart sounds: Normal heart sounds.  Pulmonary:     Effort: Pulmonary effort is normal.     Breath sounds: Normal breath sounds.  Lymphadenopathy:     Cervical: No cervical adenopathy.  Skin:    General: Skin is warm and dry.  Neurological:     General: No focal deficit present.     Mental Status: He is alert.  Psychiatric:        Mood and Affect: Mood normal.        Behavior: Behavior normal.     ------------------------------------------------------------------------------------------------------------------------------------------------------------------------------------------------------------------- Assessment and Plan   Acute maxillary sinusitis -Given immunocompromised state, focal tenderness and symptoms I will start him on augmentin.  -Recommend maintain good fluid intake -May use tylenol for pain -F/u if symptoms continue to worsen or are not improving.

## 2018-11-03 NOTE — Assessment & Plan Note (Signed)
-  Given immunocompromised state, focal tenderness and symptoms I will start him on augmentin.  -Recommend maintain good fluid intake -May use tylenol for pain -F/u if symptoms continue to worsen or are not improving.

## 2018-11-03 NOTE — Patient Instructions (Signed)

## 2018-11-09 ENCOUNTER — Encounter: Payer: Self-pay | Admitting: Internal Medicine

## 2018-11-09 ENCOUNTER — Inpatient Hospital Stay: Payer: Medicare Other | Admitting: Internal Medicine

## 2018-11-09 ENCOUNTER — Other Ambulatory Visit: Payer: Self-pay

## 2018-11-09 ENCOUNTER — Inpatient Hospital Stay: Payer: Medicare Other

## 2018-11-09 VITALS — BP 151/89 | HR 66 | Temp 98.1°F | Resp 18 | Ht 72.0 in | Wt 181.0 lb

## 2018-11-09 DIAGNOSIS — I10 Essential (primary) hypertension: Secondary | ICD-10-CM | POA: Diagnosis not present

## 2018-11-09 DIAGNOSIS — Z5111 Encounter for antineoplastic chemotherapy: Secondary | ICD-10-CM | POA: Diagnosis not present

## 2018-11-09 DIAGNOSIS — C450 Mesothelioma of pleura: Secondary | ICD-10-CM

## 2018-11-09 DIAGNOSIS — Z79899 Other long term (current) drug therapy: Secondary | ICD-10-CM | POA: Diagnosis not present

## 2018-11-09 DIAGNOSIS — R110 Nausea: Secondary | ICD-10-CM | POA: Diagnosis not present

## 2018-11-09 DIAGNOSIS — E785 Hyperlipidemia, unspecified: Secondary | ICD-10-CM | POA: Diagnosis not present

## 2018-11-09 DIAGNOSIS — I4892 Unspecified atrial flutter: Secondary | ICD-10-CM | POA: Diagnosis not present

## 2018-11-09 DIAGNOSIS — Z7901 Long term (current) use of anticoagulants: Secondary | ICD-10-CM | POA: Diagnosis not present

## 2018-11-09 LAB — CBC WITH DIFFERENTIAL (CANCER CENTER ONLY)
Abs Immature Granulocytes: 0.01 10*3/uL (ref 0.00–0.07)
Basophils Absolute: 0 10*3/uL (ref 0.0–0.1)
Basophils Relative: 1 %
Eosinophils Absolute: 0.1 10*3/uL (ref 0.0–0.5)
Eosinophils Relative: 3 %
HCT: 38.9 % — ABNORMAL LOW (ref 39.0–52.0)
Hemoglobin: 12.9 g/dL — ABNORMAL LOW (ref 13.0–17.0)
Immature Granulocytes: 0 %
Lymphocytes Relative: 26 %
Lymphs Abs: 0.7 10*3/uL (ref 0.7–4.0)
MCH: 28.4 pg (ref 26.0–34.0)
MCHC: 33.2 g/dL (ref 30.0–36.0)
MCV: 85.5 fL (ref 80.0–100.0)
MONOS PCT: 17 %
Monocytes Absolute: 0.5 10*3/uL (ref 0.1–1.0)
Neutro Abs: 1.5 10*3/uL — ABNORMAL LOW (ref 1.7–7.7)
Neutrophils Relative %: 53 %
Platelet Count: 298 10*3/uL (ref 150–400)
RBC: 4.55 MIL/uL (ref 4.22–5.81)
RDW: 14.6 % (ref 11.5–15.5)
WBC Count: 2.8 10*3/uL — ABNORMAL LOW (ref 4.0–10.5)
nRBC: 0 % (ref 0.0–0.2)

## 2018-11-09 LAB — CMP (CANCER CENTER ONLY)
ALT: 11 U/L (ref 0–44)
AST: 19 U/L (ref 15–41)
Albumin: 2.8 g/dL — ABNORMAL LOW (ref 3.5–5.0)
Alkaline Phosphatase: 58 U/L (ref 38–126)
Anion gap: 10 (ref 5–15)
BUN: 13 mg/dL (ref 8–23)
CO2: 22 mmol/L (ref 22–32)
Calcium: 8.7 mg/dL — ABNORMAL LOW (ref 8.9–10.3)
Chloride: 104 mmol/L (ref 98–111)
Creatinine: 1.16 mg/dL (ref 0.61–1.24)
GFR, Est AFR Am: >60 mL/min (ref >60)
GFR, Estimated: >60 mL/min (ref >60)
GLUCOSE: 102 mg/dL — AB (ref 70–99)
Potassium: 4.1 mmol/L (ref 3.5–5.1)
Sodium: 136 mmol/L (ref 135–145)
TOTAL PROTEIN: 6.2 g/dL — AB (ref 6.5–8.1)
Total Bilirubin: 0.3 mg/dL (ref 0.3–1.2)

## 2018-11-09 LAB — MAGNESIUM: Magnesium: 1.7 mg/dL (ref 1.7–2.4)

## 2018-11-09 MED ORDER — PROCHLORPERAZINE MALEATE 10 MG PO TABS: 10.0000 mg | ORAL_TABLET | Freq: Four times a day (QID) | ORAL | 0 refills | Status: DC | PRN

## 2018-11-09 MED ORDER — ONDANSETRON HCL 8 MG PO TABS: 8.0000 mg | ORAL_TABLET | Freq: Three times a day (TID) | ORAL | 0 refills | Status: DC | PRN

## 2018-11-09 NOTE — Progress Notes (Signed)
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  PRIOR THERAPY: None  CURRENT THERAPY: Systemic chemotherapy with cisplatin 75 mg/M2, Alimta 500 mg/M2 and Avastin 15 mg/KG every 3 weeks.  First dose October 20, 2018.  Status post 1 cycle.  INTERVAL HISTORY: [name] 78 y.o. male returns to the clinic today for follow-up visit.  The patient is feeling fine today with no concerning complaints except for fatigue.  He lost few pounds since his last visit.  He was complaining of delayed nausea after his treatment.  He denied having any current chest pain but has shortness of breath with exertion with no cough or hemoptysis.  He has no headache or visual changes.  He was treated with amoxicillin for 7 days for sinus infection.  The patient is here today for evaluation before starting cycle #2 tomorrow.  MEDICAL HISTORY: Past Medical History:  Diagnosis Date  . Atrial flutter ([place]) 03-2012  . Chronic back pain   . Headache(784.0)    migraines x 3 in life  . Hearing loss   . Hyperlipidemia   . Hypertension   . HYPOGONADISM, MALE 10/12/2007   Followup per urology    . Melanoma ([place])    melanoma left forearm 2005- no chemo  . PROSTATE CANCER, HX OF 10/06/2008   Status post surgery approximately 2010      ALLERGIES:  is allergic to eucalyptol and statins.  MEDICATIONS:  Current Outpatient Medications  Medication Sig Dispense Refill  . albuterol (PROVENTIL HFA;VENTOLIN HFA) 108 (90 Base) MCG/ACT inhaler Inhale 2 puffs into the lungs every 6 (six) hours as needed for wheezing or shortness of breath. 1 Inhaler 6  .  amoxicillin-clavulanate (AUGMENTIN) 875-125 MG tablet Take 1 tablet by mouth 2 (two) times daily. 20 tablet 0  . apixaban (ELIQUIS) 5 MG TABS tablet Take 1 tablet (5 mg total) by mouth 2 (two) times daily. 60 tablet 6  . dexamethasone (DECADRON) 4 MG tablet 4 mg p.o. twice daily the day before, day of and day after chemotherapy every 3 weeks 40 tablet 0  . fenofibrate 160 MG tablet TAKE 1 TABLET BY MOUTH  DAILY 90 tablet 2  . folic acid (FOLVITE) 1 MG tablet Take 1 tablet (1 mg total) by mouth daily. 30 tablet 4  . prochlorperazine (COMPAZINE) 10 MG tablet Take 1 tablet (10 mg total) by mouth every 6 (six) hours as needed for nausea or vomiting. 30 tablet 0  . verapamil (CALAN-SR) 240 MG CR tablet TAKE 1 TABLET BY MOUTH AT  BEDTIME 90 tablet 3   No current facility-administered medications for this visit.     SURGICAL HISTORY:  Past Surgical History:  Procedure Laterality Date  . ABLATION  01-24-2014   repeat CTI ablation by Dr Lovena Le  . ABLATION OF DYSRHYTHMIC FOCUS  10/15/2012   CTI ablation by Dr Lovena Le for atrial flutter  . arhtroscopy  right knee  2010  . ATRIAL FLUTTER ABLATION N/A 10/15/2012   Procedure: ATRIAL FLUTTER ABLATION;  Surgeon: Evans Lance, MD;  Location: [facility] CATH LAB;  Service: Cardiovascular;  Laterality: N/A;  . ATRIAL FLUTTER ABLATION N/A 01/24/2014   Procedure: ATRIAL FLUTTER ABLATION;  Surgeon:  Evans Lance, MD;  Location[facility] CATH LAB;  Service: Cardiovascular;  Laterality: N/A;  . BACK SURGERY  07/2006   lumbar discectomy  . CARDIAC CATHETERIZATION     15 years ago  . CARDIOVERSION  05/18/2012   Procedure: CARDIOVERSION;  Surgeon: Peter M Martinique, MD;  Location: [facility] ENDOSCOPY;  Service: Cardiovascular;  Laterality: N/A;  on eloquis on-going  . COLONOSCOPY W/ POLYPECTOMY    . LUMBAR LAMINECTOMY/DECOMPRESSION MICRODISCECTOMY  08/16/2011   Procedure: LUMBAR LAMINECTOMY/DECOMPRESSION MICRODISCECTOMY;  Surgeon: Dahlia Bailiff;  Location: [facility] ORS;  Service: Orthopedics;   Laterality: N/A;  Lumbar Decompression and Foraminotomy L4-S1    . MELANOMA EXCISION  12/13/03   [place] [facility]  . PROSTATECTOMY  07/2008  . ROTATOR CUFF REPAIR  2008   right; Dr Justice Britain  . TONSILLECTOMY  1947    REVIEW OF SYSTEMS:  Constitutional: positive for fatigue and weight loss Eyes: negative Ears, nose, mouth, throat, and face: negative Respiratory: positive for dyspnea on exertion and pleurisy/chest pain Cardiovascular: negative Gastrointestinal: negative Genitourinary:negative Integument/breast: negative Hematologic/lymphatic: negative Musculoskeletal:negative Neurological: negative Behavioral/Psych: negative Endocrine: negative Allergic/Immunologic: negative   PHYSICAL EXAMINATION: General appearance: alert, cooperative, fatigued and no distress Head: Normocephalic, without obvious abnormality, atraumatic Neck: no adenopathy, no JVD, supple, symmetrical, trachea midline and thyroid not enlarged, symmetric, no tenderness/mass/nodules Lymph nodes: Cervical, supraclavicular, and axillary nodes normal. Resp: clear to auscultation bilaterally Back: symmetric, no curvature. ROM normal. No CVA tenderness. Cardio: regular rate and rhythm, S1, S2 normal, no murmur, click, rub or gallop GI: soft, non-tender; bowel sounds normal; no masses,  no organomegaly Extremities: extremities normal, atraumatic, no cyanosis or edema Neurologic: Alert and oriented X 3, normal strength and tone. Normal symmetric reflexes. Normal coordination and gait  ECOG PERFORMANCE STATUS: 1 - Symptomatic but completely ambulatory  Blood pressure (!) 151/89, pulse 66, temperature 98.1 F (36.7 C), temperature source Oral, resp. rate 18, height 6' (1.829 m), weight 181 lb (82.1 kg), SpO2 98 %.  LABORATORY DATA: Lab Results  Component Value Date   WBC 2.8 (L) 11/09/2018   HGB 12.9 (L) 11/09/2018   HCT 38.9 (L) 11/09/2018   MCV 85.5 11/09/2018   PLT 298 11/09/2018      Chemistry       Component Value Date/Time   NA 136 11/09/2018 1112   K 4.1 11/09/2018 1112   CL 104 11/09/2018 1112   CO2 22 11/09/2018 1112   BUN 13 11/09/2018 1112   CREATININE 1.16 11/09/2018 1112      Component Value Date/Time   CALCIUM 8.7 (L) 11/09/2018 1112   ALKPHOS 58 11/09/2018 1112   AST 19 11/09/2018 1112   ALT 11 11/09/2018 1112   BILITOT 0.3 11/09/2018 1112       RADIOGRAPHIC STUDIES: No results found.  ASSESSMENT AND PLAN: This is a very pleasant 78 years old white male recently diagnosed with stage IV malignant pleural mesothelioma, epithelioid type diagnosed in January 2020.  The patient is here today for evaluation and discussion of neoadjuvant systemic chemotherapy. He is currently undergoing neoadjuvant systemic chemotherapy with cisplatin, Alimta and Avastin status post 1 cycle.  He tolerated the first cycle of his treatment well except for fatigue, lack of appetite as well as few pounds of weight loss.  He also has delayed nausea. I recommended for the patient to proceed with cycle #2 tomorrow.  I will add Zofran to his medication to be used for the delayed nausea. I also strongly encouraged the patient to increase his oral  intake and to eat more snacks in between meals. I will see him back for follow-up visit in 3 weeks for evaluation with repeat CT scan of the chest, abdomen and pelvis for restaging of his disease before starting cycle #3. In the meantime he would see Dr. Wynelle Cleveland after his scan to evaluate for surgical intervention. The patient was advised to call immediately if he has any other concerning symptoms in the interval. The patient voices understanding of current disease status and treatment options and is in agreement with the current care plan.  All questions were answered. The patient knows to call the clinic with any problems, questions or concerns. We can certainly see the patient much sooner if necessary.  I spent 15 minutes counseling the patient face to  face. The total time spent in the appointment was 25 minutes.  Disclaimer: This note was dictated with voice recognition software. Similar sounding words can inadvertently be transcribed and may not be corrected upon review.

## 2018-11-10 ENCOUNTER — Telehealth: Payer: Self-pay | Admitting: Internal Medicine

## 2018-11-10 ENCOUNTER — Other Ambulatory Visit: Payer: Self-pay

## 2018-11-10 ENCOUNTER — Inpatient Hospital Stay: Payer: Medicare Other

## 2018-11-10 VITALS — BP 146/74 | HR 77 | Temp 98.0°F | Resp 18

## 2018-11-10 DIAGNOSIS — C450 Mesothelioma of pleura: Secondary | ICD-10-CM | POA: Diagnosis not present

## 2018-11-10 DIAGNOSIS — Z79899 Other long term (current) drug therapy: Secondary | ICD-10-CM | POA: Diagnosis not present

## 2018-11-10 DIAGNOSIS — Z7901 Long term (current) use of anticoagulants: Secondary | ICD-10-CM | POA: Diagnosis not present

## 2018-11-10 DIAGNOSIS — E785 Hyperlipidemia, unspecified: Secondary | ICD-10-CM | POA: Diagnosis not present

## 2018-11-10 DIAGNOSIS — Z5111 Encounter for antineoplastic chemotherapy: Secondary | ICD-10-CM | POA: Diagnosis not present

## 2018-11-10 DIAGNOSIS — R110 Nausea: Secondary | ICD-10-CM | POA: Diagnosis not present

## 2018-11-10 DIAGNOSIS — I4892 Unspecified atrial flutter: Secondary | ICD-10-CM | POA: Diagnosis not present

## 2018-11-10 DIAGNOSIS — I10 Essential (primary) hypertension: Secondary | ICD-10-CM | POA: Diagnosis not present

## 2018-11-10 MED ORDER — SODIUM CHLORIDE 0.9 % IV SOLN
15.5000 mg/kg | Freq: Once | INTRAVENOUS | Status: AC
  Administered 2018-11-10: 1300 mg via INTRAVENOUS
  Filled 2018-11-10: qty 32

## 2018-11-10 MED ORDER — SODIUM CHLORIDE 0.9 % IV SOLN
Freq: Once | INTRAVENOUS | Status: AC
  Administered 2018-11-10: 11:00:00 via INTRAVENOUS
  Filled 2018-11-10: qty 5

## 2018-11-10 MED ORDER — SODIUM CHLORIDE 0.9 % IV SOLN
485.0000 mg/m2 | Freq: Once | INTRAVENOUS | Status: AC
  Administered 2018-11-10: 1000 mg via INTRAVENOUS
  Filled 2018-11-10: qty 40

## 2018-11-10 MED ORDER — SODIUM CHLORIDE 0.9 % IV SOLN
75.0000 mg/m2 | Freq: Once | INTRAVENOUS | Status: AC
  Administered 2018-11-10: 155 mg via INTRAVENOUS
  Filled 2018-11-10: qty 155

## 2018-11-10 MED ORDER — SODIUM CHLORIDE 0.9 % IV SOLN
INTRAVENOUS | Status: DC
  Administered 2018-11-10: 09:00:00 via INTRAVENOUS
  Filled 2018-11-10: qty 250

## 2018-11-10 MED ORDER — PALONOSETRON HCL INJECTION 0.25 MG/5ML
0.2500 mg | Freq: Once | INTRAVENOUS | Status: AC
  Administered 2018-11-10: 0.25 mg via INTRAVENOUS

## 2018-11-10 MED ORDER — PALONOSETRON HCL INJECTION 0.25 MG/5ML
INTRAVENOUS | Status: AC
  Filled 2018-11-10: qty 5

## 2018-11-10 MED ORDER — POTASSIUM CHLORIDE 2 MEQ/ML IV SOLN
Freq: Once | INTRAVENOUS | Status: AC
  Administered 2018-11-10: 09:00:00 via INTRAVENOUS
  Filled 2018-11-10: qty 10

## 2018-11-10 NOTE — Telephone Encounter (Signed)
Central radiology will call re scan. °

## 2018-11-10 NOTE — Patient Instructions (Addendum)
[facility] Discharge Instructions for Patients Receiving Chemotherapy  Today you received the following chemotherapy agents: MVASI, Alimta, Cisplatin  To help prevent nausea and vomiting after your treatment, we encourage you to take your nausea medication as directed.   If you develop nausea and vomiting that is not controlled by your nausea medication, call the clinic.   BELOW ARE SYMPTOMS THAT SHOULD BE REPORTED IMMEDIATELY:  *FEVER GREATER THAN 100.5 F  *CHILLS WITH OR WITHOUT FEVER  NAUSEA AND VOMITING THAT IS NOT CONTROLLED WITH YOUR NAUSEA MEDICATION  *UNUSUAL SHORTNESS OF BREATH  *UNUSUAL BRUISING OR BLEEDING  TENDERNESS IN MOUTH AND THROAT WITH OR WITHOUT PRESENCE OF ULCERS  *URINARY PROBLEMS  *BOWEL PROBLEMS  UNUSUAL RASH Items with * indicate a potential emergency and should be followed up as soon as possible.  Feel free to call the clinic should you have any questions or concerns. The clinic phone number is [phone].  Please show the [facility] at check-in to the Emergency Department and triage nurse.  Coronavirus (COVID-19) Are you at risk?  Are you at risk for the Coronavirus (COVID-19)?  To be considered HIGH RISK for Coronavirus (COVID-19), you have to meet the following criteria:  . Traveled to [place], [place], [place], [place] or [place]; or in the [place] to [place], [place], [place], or [place]; and have fever, cough, and shortness of breath within the last 2 weeks of travel OR . Been in close contact with a person diagnosed with COVID-19 within the last 2 weeks and have fever, cough, and shortness of breath . IF YOU DO NOT MEET THESE CRITERIA, YOU ARE CONSIDERED LOW RISK FOR COVID-19.  What to do if you are HIGH RISK for COVID-19?  Marland Kitchen If you are having a medical emergency, call 911. . Seek medical care right away. Before you go to a doctor's office, urgent care or emergency department, call ahead and tell them  about your recent travel, contact with someone diagnosed with COVID-19, and your symptoms. You should receive instructions from your physician's office regarding next steps of care.  . When you arrive at healthcare provider, tell the healthcare staff immediately you have returned from visiting [place], [place], [place], [place] or [place]; or traveled in the [place] to [place], [place], [place], or [place]; in the last two weeks or you have been in close contact with a person diagnosed with COVID-19 in the last 2 weeks.   . Tell the health care staff about your symptoms: fever, cough and shortness of breath. . After you have been seen by a medical provider, you will be either: o Tested for (COVID-19) and discharged home on quarantine except to seek medical care if symptoms worsen, and asked to  - Stay home and avoid contact with others until you get your results (4-5 days)  - Avoid travel on public transportation if possible (such as bus, train, or airplane) or o Sent to the Emergency Department by EMS for evaluation, COVID-19 testing, and possible admission depending on your condition and test results.  What to do if you are LOW RISK for COVID-19?  Reduce your risk of any infection by using the same precautions used for avoiding the common cold or flu:  Marland Kitchen Wash your hands often with soap and warm water for at least 20 seconds.  If soap and water are not readily available, use an alcohol-based hand sanitizer with at least 60% alcohol.  . If coughing  or sneezing, cover your mouth and nose by coughing or sneezing into the elbow areas of your shirt or coat, into a tissue or into your sleeve (not your hands). . Avoid shaking hands with others and consider head nods or verbal greetings only. . Avoid touching your eyes, nose, or mouth with unwashed hands.  . Avoid close contact with people who are sick. . Avoid places or events with large numbers of people in one location, like concerts or  sporting events. . Carefully consider travel plans you have or are making. . If you are planning any travel outside or inside the [place], visit the CDC's Travelers' Health webpage for the latest health notices. . If you have some symptoms but not all symptoms, continue to monitor at home and seek medical attention if your symptoms worsen. . If you are having a medical emergency, call 911.   [place] / e-Visit: [email or web address]         [facility]: [facility]: [phone]                   [facility]: [phone]

## 2018-11-10 NOTE — Telephone Encounter (Signed)
Added additional appointments. Spoke with wife. Patient will get updated schedule at next visit.

## 2018-11-16 ENCOUNTER — Other Ambulatory Visit: Payer: Self-pay

## 2018-11-16 ENCOUNTER — Inpatient Hospital Stay: Payer: Medicare Other

## 2018-11-16 DIAGNOSIS — I4892 Unspecified atrial flutter: Secondary | ICD-10-CM | POA: Diagnosis not present

## 2018-11-16 DIAGNOSIS — I10 Essential (primary) hypertension: Secondary | ICD-10-CM | POA: Diagnosis not present

## 2018-11-16 DIAGNOSIS — C450 Mesothelioma of pleura: Secondary | ICD-10-CM | POA: Diagnosis not present

## 2018-11-16 DIAGNOSIS — Z7901 Long term (current) use of anticoagulants: Secondary | ICD-10-CM | POA: Diagnosis not present

## 2018-11-16 DIAGNOSIS — R110 Nausea: Secondary | ICD-10-CM | POA: Diagnosis not present

## 2018-11-16 DIAGNOSIS — Z5111 Encounter for antineoplastic chemotherapy: Secondary | ICD-10-CM | POA: Diagnosis not present

## 2018-11-16 DIAGNOSIS — E785 Hyperlipidemia, unspecified: Secondary | ICD-10-CM | POA: Diagnosis not present

## 2018-11-16 DIAGNOSIS — Z79899 Other long term (current) drug therapy: Secondary | ICD-10-CM | POA: Diagnosis not present

## 2018-11-16 LAB — CMP (CANCER CENTER ONLY)
ALT: 14 U/L (ref 0–44)
AST: 21 U/L (ref 15–41)
Albumin: 3 g/dL — ABNORMAL LOW (ref 3.5–5.0)
Alkaline Phosphatase: 53 U/L (ref 38–126)
Anion gap: 10 (ref 5–15)
BUN: 24 mg/dL — AB (ref 8–23)
CO2: 24 mmol/L (ref 22–32)
Calcium: 8.7 mg/dL — ABNORMAL LOW (ref 8.9–10.3)
Chloride: 101 mmol/L (ref 98–111)
Creatinine: 1.43 mg/dL — ABNORMAL HIGH (ref 0.61–1.24)
GFR, EST NON AFRICAN AMERICAN: 47 mL/min — AB (ref >60)
GFR, Est AFR Am: 54 mL/min — ABNORMAL LOW (ref >60)
Glucose, Bld: 128 mg/dL — ABNORMAL HIGH (ref 70–99)
Potassium: 3.9 mmol/L (ref 3.5–5.1)
Sodium: 135 mmol/L (ref 135–145)
Total Bilirubin: 0.4 mg/dL (ref 0.3–1.2)
Total Protein: 6.3 g/dL — ABNORMAL LOW (ref 6.5–8.1)

## 2018-11-16 LAB — CBC WITH DIFFERENTIAL (CANCER CENTER ONLY)
Abs Immature Granulocytes: 0 10*3/uL (ref 0.00–0.07)
Basophils Absolute: 0 10*3/uL (ref 0.0–0.1)
Basophils Relative: 0 %
Eosinophils Absolute: 0.1 10*3/uL (ref 0.0–0.5)
Eosinophils Relative: 2 %
HEMATOCRIT: 42.6 % (ref 39.0–52.0)
Hemoglobin: 14.4 g/dL (ref 13.0–17.0)
IMMATURE GRANULOCYTES: 0 %
LYMPHS ABS: 0.8 10*3/uL (ref 0.7–4.0)
Lymphocytes Relative: 24 %
MCH: 28.6 pg (ref 26.0–34.0)
MCHC: 33.8 g/dL (ref 30.0–36.0)
MCV: 84.7 fL (ref 80.0–100.0)
Monocytes Absolute: 0.1 10*3/uL (ref 0.1–1.0)
Monocytes Relative: 3 %
Neutro Abs: 2.4 10*3/uL (ref 1.7–7.7)
Neutrophils Relative %: 71 %
Platelet Count: 195 10*3/uL (ref 150–400)
RBC: 5.03 MIL/uL (ref 4.22–5.81)
RDW: 14.9 % (ref 11.5–15.5)
WBC Count: 3.4 10*3/uL — ABNORMAL LOW (ref 4.0–10.5)
nRBC: 0 % (ref 0.0–0.2)

## 2018-11-16 LAB — MAGNESIUM: Magnesium: 1.8 mg/dL (ref 1.7–2.4)

## 2018-11-18 ENCOUNTER — Other Ambulatory Visit: Payer: Self-pay | Admitting: Internal Medicine

## 2018-11-18 NOTE — Telephone Encounter (Signed)
Eliquis 5mg  refill request received; pt is 78 yrs old, wt-82.1kg, Crea-1.43 on 11/16/2018, last seen by Lovena Le on 09/15/2018; refill sent.

## 2018-11-23 ENCOUNTER — Inpatient Hospital Stay: Payer: Medicare Other | Attending: Internal Medicine

## 2018-11-23 ENCOUNTER — Ambulatory Visit (HOSPITAL_COMMUNITY)
Admission: RE | Admit: 2018-11-23 | Discharge: 2018-11-23 | Disposition: A | Discharge status: Home / Self Care | Payer: Medicare Other | Source: Ambulatory Visit | Attending: Internal Medicine | Admitting: Internal Medicine

## 2018-11-23 ENCOUNTER — Other Ambulatory Visit: Payer: Self-pay

## 2018-11-23 ENCOUNTER — Encounter (HOSPITAL_COMMUNITY): Payer: Self-pay

## 2018-11-23 DIAGNOSIS — K8020 Calculus of gallbladder without cholecystitis without obstruction: Secondary | ICD-10-CM | POA: Diagnosis not present

## 2018-11-23 DIAGNOSIS — C450 Mesothelioma of pleura: Secondary | ICD-10-CM | POA: Diagnosis not present

## 2018-11-23 HISTORY — DX: Malignant neoplasm of prostate: C61

## 2018-11-23 HISTORY — DX: Mesothelioma, unspecified: C45.9

## 2018-11-23 LAB — CBC WITH DIFFERENTIAL (CANCER CENTER ONLY)
Abs Immature Granulocytes: 0.01 10*3/uL (ref 0.00–0.07)
Basophils Absolute: 0 10*3/uL (ref 0.0–0.1)
Basophils Relative: 0 %
Eosinophils Absolute: 0 10*3/uL (ref 0.0–0.5)
Eosinophils Relative: 1 %
HCT: 38.1 % — ABNORMAL LOW (ref 39.0–52.0)
Hemoglobin: 12.7 g/dL — ABNORMAL LOW (ref 13.0–17.0)
Immature Granulocytes: 0 %
Lymphocytes Relative: 31 %
Lymphs Abs: 0.8 10*3/uL (ref 0.7–4.0)
MCH: 28.7 pg (ref 26.0–34.0)
MCHC: 33.3 g/dL (ref 30.0–36.0)
MCV: 86 fL (ref 80.0–100.0)
Monocytes Absolute: 0.6 10*3/uL (ref 0.1–1.0)
Monocytes Relative: 24 %
Neutro Abs: 1.1 10*3/uL — ABNORMAL LOW (ref 1.7–7.7)
Neutrophils Relative %: 44 %
Platelet Count: 164 10*3/uL (ref 150–400)
RBC: 4.43 MIL/uL (ref 4.22–5.81)
RDW: 15.1 % (ref 11.5–15.5)
WBC Count: 2.5 10*3/uL — ABNORMAL LOW (ref 4.0–10.5)
nRBC: 0 % (ref 0.0–0.2)

## 2018-11-23 LAB — CMP (CANCER CENTER ONLY)
ALT: 12 U/L (ref 0–44)
AST: 20 U/L (ref 15–41)
Albumin: 2.8 g/dL — ABNORMAL LOW (ref 3.5–5.0)
Alkaline Phosphatase: 52 U/L (ref 38–126)
Anion gap: 9 (ref 5–15)
BUN: 17 mg/dL (ref 8–23)
CO2: 24 mmol/L (ref 22–32)
Calcium: 8.7 mg/dL — ABNORMAL LOW (ref 8.9–10.3)
Chloride: 103 mmol/L (ref 98–111)
Creatinine: 1.4 mg/dL — ABNORMAL HIGH (ref 0.61–1.24)
GFR, Est AFR Am: 56 mL/min — ABNORMAL LOW (ref >60)
GFR, Estimated: 48 mL/min — ABNORMAL LOW (ref >60)
Glucose, Bld: 125 mg/dL — ABNORMAL HIGH (ref 70–99)
Potassium: 4.1 mmol/L (ref 3.5–5.1)
Sodium: 136 mmol/L (ref 135–145)
Total Bilirubin: 0.4 mg/dL (ref 0.3–1.2)
Total Protein: 6.1 g/dL — ABNORMAL LOW (ref 6.5–8.1)

## 2018-11-23 LAB — TOTAL PROTEIN, URINE DIPSTICK: Protein, ur: NEGATIVE mg/dL

## 2018-11-23 LAB — MAGNESIUM: Magnesium: 1.8 mg/dL (ref 1.7–2.4)

## 2018-11-23 MED ORDER — IOHEXOL 300 MG/ML  SOLN
100.0000 mL | Freq: Once | INTRAMUSCULAR | Status: AC | PRN
  Administered 2018-11-23: 100 mL via INTRAVENOUS

## 2018-11-23 MED ORDER — SODIUM CHLORIDE (PF) 0.9 % IJ SOLN
INTRAMUSCULAR | Status: AC
  Filled 2018-11-23: qty 50

## 2018-11-25 DIAGNOSIS — C457 Mesothelioma of other sites: Secondary | ICD-10-CM | POA: Diagnosis not present

## 2018-11-30 ENCOUNTER — Other Ambulatory Visit: Payer: Medicare Other

## 2018-11-30 ENCOUNTER — Ambulatory Visit: Payer: Medicare Other | Admitting: Internal Medicine

## 2018-12-01 ENCOUNTER — Ambulatory Visit: Payer: Medicare Other

## 2018-12-01 ENCOUNTER — Ambulatory Visit: Payer: Medicare Other | Admitting: Pulmonary Disease

## 2018-12-07 ENCOUNTER — Other Ambulatory Visit: Payer: Medicare Other

## 2018-12-08 ENCOUNTER — Encounter: Payer: Self-pay | Admitting: Internal Medicine

## 2018-12-08 NOTE — Telephone Encounter (Signed)
Dr. Julien Nordmann, please advise.

## 2018-12-09 ENCOUNTER — Telehealth: Payer: Self-pay | Admitting: Internal Medicine

## 2018-12-09 NOTE — Telephone Encounter (Signed)
New Message    Patient set up for doxemity video visit with Dr. Lovena Le on 04.30.20. Pt smart phone number is listed in the appt notes.       Virtual Visit Pre-Appointment Phone Call  "(Name), I am calling you today to discuss your upcoming appointment. We are currently trying to limit exposure to the virus that causes COVID-19 by seeing patients at home rather than in the office."  1. "What is the BEST phone number to call the day of the visit?" - include this in appointment notes  2. Do you have or have access to (through a family member/friend) a smartphone with video capability that we can use for your visit?" a. If yes - list this number in appt notes as cell (if different from BEST phone #) and list the appointment type as a VIDEO visit in appointment notes b. If no - list the appointment type as a PHONE visit in appointment notes  3. Confirm consent - "In the setting of the current Covid19 crisis, you are scheduled for a (phone or video) visit with your provider on (date) at (time).  Just as we do with many in-office visits, in order for you to participate in this visit, we must obtain consent.  If you'd like, I can send this to your mychart (if signed up) or email for you to review.  Otherwise, I can obtain your verbal consent now.  All virtual visits are billed to your insurance company just like a normal visit would be.  By agreeing to a virtual visit, we'd like you to understand that the technology does not allow for your provider to perform an examination, and thus may limit your provider's ability to fully assess your condition. If your provider identifies any concerns that need to be evaluated in person, we will make arrangements to do so.  Finally, though the technology is pretty good, we cannot assure that it will always work on either your or our end, and in the setting of a video visit, we may have to convert it to a phone-only visit.  In either situation, we cannot ensure that  we have a secure connection.  Are you willing to proceed?" STAFF: Did the patient verbally acknowledge consent to telehealth visit? Document YES/NO here: YES   4. Advise patient to be prepared - "Two hours prior to your appointment, go ahead and check your blood pressure, pulse, oxygen saturation, and your weight (if you have the equipment to check those) and write them all down. When your visit starts, your provider will ask you for this information. If you have an Apple Watch or Kardia device, please plan to have heart rate information ready on the day of your appointment. Please have a pen and paper handy nearby the day of the visit as well."  5. Give patient instructions for MyChart download to smartphone OR Doximity/Doxy.me as below if video visit (depending on what platform provider is using)  6. Inform patient they will receive a phone call 15 minutes prior to their appointment time (may be from unknown caller ID) so they should be prepared to answer    TELEPHONE CALL NOTE  [name] has been deemed a candidate for a follow-up tele-health visit to limit community exposure during the Covid-19 pandemic. I spoke with the patient via phone to ensure availability of phone/video source, confirm preferred email & phone number, and discuss instructions and expectations.  I reminded [name] to be prepared with any  vital sign and/or heart rhythm information that could potentially be obtained via home monitoring, at the time of his visit. I reminded [name] to expect a phone call prior to his visit.  [name] 12/09/2018 1:15 PM   INSTRUCTIONS FOR DOWNLOADING THE MYCHART APP TO SMARTPHONE  - The patient must first make sure to have activated MyChart and know their login information - If Apple, go to [employer] and type in MyChart in the search bar and download the app. If Android, ask patient to go to [employer] and type in [place] in the search bar and download the  app. The app is free but as with any other app downloads, their phone may require them to verify saved payment information or Apple/Android password.  - The patient will need to then log into the app with their MyChart username and password, and select [facility] as their healthcare provider to link the account. When it is time for your visit, go to the MyChart app, find appointments, and click Begin Video Visit. Be sure to Select Allow for your device to access the Microphone and Camera for your visit. You will then be connected, and your provider will be with you shortly.  **If they have any issues connecting, or need assistance please contact MyChart service desk [phone])**  **If using a computer, in order to ensure the best quality for their visit they will need to use either of the following Internet Browsers: [employer], or Google Chrome**  IF USING DOXIMITY or DOXY.ME - The patient will receive a link just prior to their visit by text.     FULL LENGTH CONSENT FOR TELE-HEALTH VISIT   I hereby voluntarily request, consent and authorize [facility] and its employed or contracted physicians, physician assistants, nurse practitioners or other licensed health care professionals (the Practitioner), to provide me with telemedicine health care services (the Services") as deemed necessary by the treating Practitioner. I acknowledge and consent to receive the Services by the Practitioner via telemedicine. I understand that the telemedicine visit will involve communicating with the Practitioner through live audiovisual communication technology and the disclosure of certain medical information by electronic transmission. I acknowledge that I have been given the opportunity to request an in-person assessment or other available alternative prior to the telemedicine visit and am voluntarily participating in the telemedicine visit.  I understand that I have the right to withhold or  withdraw my consent to the use of telemedicine in the course of my care at any time, without affecting my right to future care or treatment, and that the Practitioner or I may terminate the telemedicine visit at any time. I understand that I have the right to inspect all information obtained and/or recorded in the course of the telemedicine visit and may receive copies of available information for a reasonable fee.  I understand that some of the potential risks of receiving the Services via telemedicine include:   Delay or interruption in medical evaluation due to technological equipment failure or disruption;  Information transmitted may not be sufficient (e.g. poor resolution of images) to allow for appropriate medical decision making by the Practitioner; and/or   In rare instances, security protocols could fail, causing a breach of personal health information.  Furthermore, I acknowledge that it is my responsibility to provide information about my medical history, conditions and care that is complete and accurate to the best of my ability. I acknowledge that Practitioner's advice, recommendations, and/or decision  may be based on factors not within their control, such as incomplete or inaccurate data provided by me or distortions of diagnostic images or specimens that may result from electronic transmissions. I understand that the practice of medicine is not an exact science and that Practitioner makes no warranties or guarantees regarding treatment outcomes. I acknowledge that I will receive a copy of this consent concurrently upon execution via email to the email address I last provided but may also request a printed copy by calling the office of [facility].    I understand that my insurance will be billed for this visit.   I have read or had this consent read to me.  I understand the contents of this consent, which adequately explains the benefits and risks of the Services being provided via  telemedicine.   I have been provided ample opportunity to ask questions regarding this consent and the Services and have had my questions answered to my satisfaction.  I give my informed consent for the services to be provided through the use of telemedicine in my medical care  By participating in this telemedicine visit I agree to the above.

## 2018-12-10 DIAGNOSIS — Z85820 Personal history of malignant melanoma of skin: Secondary | ICD-10-CM | POA: Diagnosis not present

## 2018-12-10 DIAGNOSIS — L812 Freckles: Secondary | ICD-10-CM | POA: Diagnosis not present

## 2018-12-10 DIAGNOSIS — Z85828 Personal history of other malignant neoplasm of skin: Secondary | ICD-10-CM | POA: Diagnosis not present

## 2018-12-10 DIAGNOSIS — L821 Other seborrheic keratosis: Secondary | ICD-10-CM | POA: Diagnosis not present

## 2018-12-10 DIAGNOSIS — D1801 Hemangioma of skin and subcutaneous tissue: Secondary | ICD-10-CM | POA: Diagnosis not present

## 2018-12-14 ENCOUNTER — Other Ambulatory Visit: Payer: Medicare Other

## 2018-12-14 ENCOUNTER — Telehealth: Payer: Self-pay

## 2018-12-14 NOTE — Telephone Encounter (Signed)
Copied from [facility] [id number]. Topic: General - Other >> Dec 14, 2018 10:33 AM Oneta Rack wrote: Relation to pt: self  Call back [place]   Reason for call:  Patient was seen in the ED on 11/20/2017 and states when he followed up with PCP on 11/27/2017  he was dx with CKD and doesn't know why. Patient states he started ELIQUIS 5 MG TABS tablet 12/07/2018 and patient states chart reflects 6 years prior to 12/07/2018 medication was prescribed unbeknownst to him. Patient would like chart updated and would like to speak with  "Sharyn Lull" PCP nurse, please advise

## 2018-12-17 ENCOUNTER — Telehealth (INDEPENDENT_AMBULATORY_CARE_PROVIDER_SITE_OTHER): Payer: Medicare Other | Admitting: Internal Medicine

## 2018-12-17 ENCOUNTER — Other Ambulatory Visit: Payer: Self-pay

## 2018-12-17 DIAGNOSIS — I4819 Other persistent atrial fibrillation: Secondary | ICD-10-CM | POA: Diagnosis not present

## 2018-12-17 DIAGNOSIS — I10 Essential (primary) hypertension: Secondary | ICD-10-CM | POA: Diagnosis not present

## 2018-12-17 NOTE — Progress Notes (Signed)
Electrophysiology TeleHealth Note   Due to national recommendations of social distancing due to COVID 19, an audio/video telehealth visit is felt to be most appropriate for this patient at this time.  See MyChart message from today for the patient's consent to telehealth for [facility].   Date:  12/17/2018   ID:  [name], DOB [birth year], MRN [id number]  Location: patient's home  Provider location: [address] [place]  Evaluation Performed: Follow-up visit  PCP:  Lucille Passy, MD  Cardiologist:  Cristopher Peru, MD  Electrophysiologist:  Dr Lovena Le  Chief Complaint:  "I am going to have another scan at [facility]"  History of Present Illness:    [name] is a 78 y.o. male who presents via audio/video conferencing for a telehealth visit today.  He has a remote h/o atrial flutter s/p ablation. He developed a single episode of atrial fib. 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 He was treated with chemotherapy without improvement.  He was in the ED several weeks ago with sob and had a liter of fluid removed which he noted was bloody. At that time he was in atrial fib with a controlled VR. He is not sure if he has gone back to NSR. He does note that his heart seems regular. He remains on Eliquis. His Hgb has been ok. He is pending a chest CT to consider lung resection. The patient denies symptoms of fevers, chills, cough, or new SOB worrisome for COVID 19.  Past Medical History:  Diagnosis Date  . Atrial flutter ([place]) 03-2012  . Chronic back pain   . Headache(784.0)    migraines x 3 in life  . Hearing loss   . Hyperlipidemia   . Hypertension   . HYPOGONADISM, MALE 10/12/2007   Followup per urology    . Melanoma ([place])    melanoma left forearm 2005- no chemo  . 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 Prostate CA ([place]) dx'd 2009  . PROSTATE CANCER, HX OF 10/06/2008   Status post surgery  approximately 2010      Past Surgical History:  Procedure Laterality Date  . ABLATION  01-24-2014   repeat CTI ablation by Dr Lovena Le  . ABLATION OF DYSRHYTHMIC FOCUS  10/15/2012   CTI ablation by Dr Lovena Le for atrial flutter  . arhtroscopy  right knee  2010  . ATRIAL FLUTTER ABLATION N/A 10/15/2012   Procedure: ATRIAL FLUTTER ABLATION;  Surgeon: Evans Lance, MD;  Location: [facility] CATH LAB;  Service: Cardiovascular;  Laterality: N/A;  . ATRIAL FLUTTER ABLATION N/A 01/24/2014   Procedure: ATRIAL FLUTTER ABLATION;  Surgeon: Evans Lance, MD;  Location: [facility] CATH LAB;  Service: Cardiovascular;  Laterality: N/A;  . BACK SURGERY  07/2006   lumbar discectomy  . CARDIAC CATHETERIZATION     15 years ago  . CARDIOVERSION  05/18/2012   Procedure: CARDIOVERSION;  Surgeon: Peter M Martinique, MD;  Location: [facility] ENDOSCOPY;  Service: Cardiovascular;  Laterality: N/A;  on eloquis on-going  . COLONOSCOPY W/ POLYPECTOMY    . LUMBAR LAMINECTOMY/DECOMPRESSION MICRODISCECTOMY  08/16/2011   Procedure: LUMBAR LAMINECTOMY/DECOMPRESSION MICRODISCECTOMY;  Surgeon: Dahlia Bailiff;  Location: [facility] ORS;  Service: Orthopedics;  Laterality: N/A;  Lumbar Decompression and Foraminotomy L4-S1    . MELANOMA EXCISION  12/13/03   [place] [facility]  . PROSTATECTOMY  07/2008  . ROTATOR CUFF REPAIR  2008   right; Dr  Justice Britain  . TONSILLECTOMY  1947    Current Outpatient Medications  Medication Sig Dispense Refill  . albuterol (PROVENTIL HFA;VENTOLIN HFA) 108 (90 Base) MCG/ACT inhaler Inhale 2 puffs into the lungs every 6 (six) hours as needed for wheezing or shortness of breath. 1 Inhaler 6  . amoxicillin-clavulanate (AUGMENTIN) 875-125 MG tablet Take 1 tablet by mouth 2 (two) times daily. 20 tablet 0  . dexamethasone (DECADRON) 4 MG tablet 4 mg p.o. twice daily the day before, day of and day after chemotherapy every 3 weeks 40 tablet 0  . ELIQUIS 5 MG TABS tablet TAKE 1 TABLET BY MOUTH TWICE A DAY 60 tablet 9  . fenofibrate 160 MG  tablet TAKE 1 TABLET BY MOUTH  DAILY 90 tablet 2  . folic acid (FOLVITE) 1 MG tablet Take 1 tablet (1 mg total) by mouth daily. 30 tablet 4  . ondansetron (ZOFRAN) 8 MG tablet Take 1 tablet (8 mg total) by mouth every 8 (eight) hours as needed for nausea or vomiting. 20 tablet 0  . prochlorperazine (COMPAZINE) 10 MG tablet Take 1 tablet (10 mg total) by mouth every 6 (six) hours as needed for nausea or vomiting. 30 tablet 0  . verapamil (CALAN-SR) 240 MG CR tablet TAKE 1 TABLET BY MOUTH AT  BEDTIME 90 tablet 3   No current facility-administered medications for this visit.     Allergies:   Eucalyptol and Statins   Social History:  The patient  reports that he quit smoking about 34 years ago. His smoking use included cigarettes. He has a 20.00 pack-year smoking history. He has never used smokeless tobacco. He reports current alcohol use of about 2.0 standard drinks of alcohol per week. He reports that he does not use drugs.   Family History:  The patient's  family history includes Colon cancer in his brother; Pancreatic cancer in his mother.   ROS:  Please see the history of present illness.   All other systems are personally reviewed and negative.    Exam:    Vital Signs:  There were no vitals taken for this visit.  Well appearing, alert and conversant, regular work of breathing,  good skin color Eyes- anicteric, neuro- grossly intact, skin- no apparent rash or lesions or cyanosis, mouth- oral mucosa is pink   Labs/Other Tests and Data Reviewed:    Recent Labs: 11/23/2018: ALT 12; BUN 17; Creatinine 1.40; Hemoglobin 12.7; Magnesium 1.8; Platelet Count 164; Potassium 4.1; Sodium 136   Wt Readings from Last 3 Encounters:  11/09/18 181 lb (82.1 kg)  11/03/18 172 lb (78 kg)  10/12/18 183 lb 12.8 oz (83.4 kg)     Other studies personally reviewed: Additional studies/ records that were reviewed today include: Review of the above records today demonstrates: as above Prior radiographs:  reveal a large left pleural effusion   ASSESSMENT & PLAN:    1.  Atrial fib - he is probably in controlled atrial fib. We discussed the issue of systemic anti-coagulation. He is not an ideal candidate to continue but he is at risk for stroke and his hgb is ok. I would recommend he continue Eliquis unless he develops bleeding complications. 2. Mesothelioma - he is pending repeat scanning.  3. COVID 19 screen The patient denies symptoms of COVID 19 at this time.  The importance of social distancing was discussed today.  Follow-up:  3 months Next remote: n/a  Current medicines are reviewed at length with the patient today.   The patient does  not have concerns regarding his medicines.  The following changes were made today:  none  Labs/ tests ordered today include:  No orders of the defined types were placed in this encounter.    Patient Risk:  after full review of this patients clinical status, I feel that they are at moderate risk at this time.  Today, I have spent 25 minutes with the patient with telehealth technology discussing all of the above.    Signed, Cristopher Peru, MD  12/17/2018 12:18 PM     [facility] [address] [place]  [phone] (office) [phone] (fax)

## 2018-12-17 NOTE — Telephone Encounter (Signed)
[name] discussed with pt that it looked like the Eliquis was initiated 8.22.2013 due to A-Fib/He said that he had a procedure done to correct the A-Fib and the Eliquis was no longer needed (this is noted in [name] visit)/the Eliquis was reinitiated at ED visit on 4.4.19 and does not understand why it was initiated as he feels the 464mL of fluid that was drawn off of his lung caused to return to normal/He will discuss with [name] if this Eliquis is truly needed  He also wanted to discuss why he was Dx with CKD/I talked to him about this also being done at the 4.4.19 ED visit due to his Creatinine level/If I am not mistaken the reference range for the Creatinine in the system changed between 2016 to 2019/but he states he will discuss the need to have the Dx removed as well/thx dmf

## 2018-12-21 ENCOUNTER — Other Ambulatory Visit: Payer: Medicare Other

## 2018-12-21 ENCOUNTER — Ambulatory Visit: Payer: Medicare Other | Admitting: Internal Medicine

## 2018-12-22 ENCOUNTER — Ambulatory Visit: Payer: Medicare Other

## 2018-12-28 ENCOUNTER — Other Ambulatory Visit: Payer: Medicare Other

## 2019-01-02 ENCOUNTER — Encounter: Payer: Self-pay | Admitting: Internal Medicine

## 2019-01-04 ENCOUNTER — Other Ambulatory Visit: Payer: Self-pay | Admitting: Internal Medicine

## 2019-01-06 DIAGNOSIS — R9431 Abnormal electrocardiogram [ECG] [EKG]: Secondary | ICD-10-CM | POA: Diagnosis not present

## 2019-01-06 DIAGNOSIS — R918 Other nonspecific abnormal finding of lung field: Secondary | ICD-10-CM | POA: Diagnosis not present

## 2019-01-06 DIAGNOSIS — I4819 Other persistent atrial fibrillation: Secondary | ICD-10-CM | POA: Diagnosis not present

## 2019-01-06 DIAGNOSIS — R935 Abnormal findings on diagnostic imaging of other abdominal regions, including retroperitoneum: Secondary | ICD-10-CM | POA: Diagnosis not present

## 2019-01-06 DIAGNOSIS — C457 Mesothelioma of other sites: Secondary | ICD-10-CM | POA: Diagnosis not present

## 2019-01-06 DIAGNOSIS — I4510 Unspecified right bundle-branch block: Secondary | ICD-10-CM | POA: Diagnosis not present

## 2019-01-06 DIAGNOSIS — T80818A Extravasation of other vesicant agent, initial encounter: Secondary | ICD-10-CM | POA: Diagnosis not present

## 2019-01-06 DIAGNOSIS — Z87891 Personal history of nicotine dependence: Secondary | ICD-10-CM | POA: Diagnosis not present

## 2019-01-06 DIAGNOSIS — R590 Localized enlarged lymph nodes: Secondary | ICD-10-CM | POA: Diagnosis not present

## 2019-01-12 ENCOUNTER — Other Ambulatory Visit: Payer: Self-pay | Admitting: Internal Medicine

## 2019-01-12 DIAGNOSIS — I10 Essential (primary) hypertension: Secondary | ICD-10-CM | POA: Diagnosis not present

## 2019-01-12 DIAGNOSIS — Z85118 Personal history of other malignant neoplasm of bronchus and lung: Secondary | ICD-10-CM | POA: Diagnosis not present

## 2019-01-12 DIAGNOSIS — C457 Mesothelioma of other sites: Secondary | ICD-10-CM | POA: Diagnosis not present

## 2019-01-12 DIAGNOSIS — Z85820 Personal history of malignant melanoma of skin: Secondary | ICD-10-CM | POA: Diagnosis not present

## 2019-01-12 DIAGNOSIS — C801 Malignant (primary) neoplasm, unspecified: Secondary | ICD-10-CM | POA: Diagnosis not present

## 2019-01-12 DIAGNOSIS — I517 Cardiomegaly: Secondary | ICD-10-CM | POA: Diagnosis not present

## 2019-01-12 DIAGNOSIS — Z9221 Personal history of antineoplastic chemotherapy: Secondary | ICD-10-CM | POA: Diagnosis not present

## 2019-01-12 DIAGNOSIS — Z87891 Personal history of nicotine dependence: Secondary | ICD-10-CM | POA: Diagnosis not present

## 2019-01-12 DIAGNOSIS — I4891 Unspecified atrial fibrillation: Secondary | ICD-10-CM | POA: Diagnosis not present

## 2019-01-12 DIAGNOSIS — Z7901 Long term (current) use of anticoagulants: Secondary | ICD-10-CM | POA: Diagnosis not present

## 2019-01-12 DIAGNOSIS — C459 Mesothelioma, unspecified: Secondary | ICD-10-CM | POA: Diagnosis not present

## 2019-01-12 DIAGNOSIS — R918 Other nonspecific abnormal finding of lung field: Secondary | ICD-10-CM | POA: Diagnosis not present

## 2019-01-12 DIAGNOSIS — C7801 Secondary malignant neoplasm of right lung: Secondary | ICD-10-CM | POA: Diagnosis not present

## 2019-01-12 DIAGNOSIS — Z1159 Encounter for screening for other viral diseases: Secondary | ICD-10-CM | POA: Diagnosis not present

## 2019-01-12 DIAGNOSIS — Z4682 Encounter for fitting and adjustment of non-vascular catheter: Secondary | ICD-10-CM | POA: Diagnosis not present

## 2019-01-12 DIAGNOSIS — J90 Pleural effusion, not elsewhere classified: Secondary | ICD-10-CM | POA: Diagnosis not present

## 2019-01-12 DIAGNOSIS — E781 Pure hyperglyceridemia: Secondary | ICD-10-CM | POA: Diagnosis not present

## 2019-01-14 ENCOUNTER — Other Ambulatory Visit: Payer: Self-pay | Admitting: Medical Oncology

## 2019-01-14 NOTE — Telephone Encounter (Signed)
err

## 2019-01-15 MED ORDER — GABAPENTIN 100 MG PO CAPS
100.00 | ORAL_CAPSULE | ORAL | Status: DC
Start: 2019-01-15 — End: 2019-01-15

## 2019-01-15 MED ORDER — GLUCAGON HCL RDNA (DIAGNOSTIC) 1 MG IJ SOLR
1.00 | INTRAMUSCULAR | Status: DC
Start: ? — End: 2019-01-15

## 2019-01-15 MED ORDER — MAGNESIUM HYDROXIDE 400 MG/5ML PO SUSP
30.00 | ORAL | Status: DC
Start: ? — End: 2019-01-15

## 2019-01-15 MED ORDER — HEPARIN SODIUM (PORCINE) 5000 UNIT/ML IJ SOLN
5000.00 | INTRAMUSCULAR | Status: DC
Start: 2019-01-16 — End: 2019-01-15

## 2019-01-15 MED ORDER — SENNOSIDES-DOCUSATE SODIUM 8.6-50 MG PO TABS
2.00 | ORAL_TABLET | ORAL | Status: DC
Start: ? — End: 2019-01-15

## 2019-01-15 MED ORDER — POLYETHYLENE GLYCOL 3350 17 G PO PACK
17.00 | PACK | ORAL | Status: DC
Start: 2019-01-16 — End: 2019-01-15

## 2019-01-15 MED ORDER — Medication
5.00 | Status: DC
Start: ? — End: 2019-01-15

## 2019-01-15 MED ORDER — IBUPROFEN 400 MG PO TABS
400.00 | ORAL_TABLET | ORAL | Status: DC
Start: 2019-01-15 — End: 2019-01-15

## 2019-01-15 MED ORDER — LACTATED RINGERS IV SOLN
INTRAVENOUS | Status: DC
Start: ? — End: 2019-01-15

## 2019-01-15 MED ORDER — BISACODYL 10 MG RE SUPP
10.00 | RECTAL | Status: DC
Start: ? — End: 2019-01-15

## 2019-01-15 MED ORDER — ONDANSETRON HCL 4 MG/2ML IJ SOLN
4.00 | INTRAMUSCULAR | Status: DC
Start: ? — End: 2019-01-15

## 2019-01-15 MED ORDER — BENICAR 20 MG PO TABS
12.50 | ORAL_TABLET | ORAL | Status: DC
Start: ? — End: 2019-01-15

## 2019-01-15 MED ORDER — OXYBUTYNIN CHLORIDE ER 10 MG PO TB24
10.00 | ORAL_TABLET | ORAL | Status: DC
Start: 2019-01-16 — End: 2019-01-15

## 2019-01-15 MED ORDER — ACETAMINOPHEN 325 MG PO TABS
650.00 | ORAL_TABLET | ORAL | Status: DC
Start: 2019-01-15 — End: 2019-01-15

## 2019-01-26 ENCOUNTER — Encounter: Payer: Self-pay | Admitting: Family Medicine

## 2019-01-26 ENCOUNTER — Ambulatory Visit (INDEPENDENT_AMBULATORY_CARE_PROVIDER_SITE_OTHER): Payer: Medicare Other | Admitting: Family Medicine

## 2019-01-26 VITALS — BP 127/70 | HR 81 | Temp 97.0°F | Wt 170.0 lb

## 2019-01-26 DIAGNOSIS — J0100 Acute maxillary sinusitis, unspecified: Secondary | ICD-10-CM

## 2019-01-26 DIAGNOSIS — C450 Mesothelioma of pleura: Secondary | ICD-10-CM | POA: Diagnosis not present

## 2019-01-26 MED ORDER — AMOXICILLIN-POT CLAVULANATE 875-125 MG PO TABS: 1.0000 | ORAL_TABLET | Freq: Two times a day (BID) | ORAL | 0 refills | Status: AC

## 2019-01-26 NOTE — Progress Notes (Signed)
Virtual Visit via Video   Due to the COVID-19 pandemic, this visit was completed with telemedicine (audio/video) technology to reduce patient and provider exposure as well as to preserve personal protective equipment.   I connected with [name] by a video enabled telemedicine application and verified that I am speaking with the correct person using two identifiers. Location patient: Home Location provider: [facility], Office Persons participating in the virtual visit: Clydie Braun, MD   I discussed the limitations of evaluation and management by telemedicine and the availability of in person appointments. The patient expressed understanding and agreed to proceed.  Care Team   Patient Care Team: Lucille Passy, MD as PCP - General (Family Medicine) Evans Lance, MD as PCP - Cardiology (Cardiology) Jarome Matin, MD as Consulting Physician (Dermatology) Griselda Miner, MD as Consulting Physician (Dermatology) Suella Broad, MD as Consulting Physician (Physical Medicine and Rehabilitation) Rana Snare, MD as Consulting Physician (Urology) Melina Schools, MD as Consulting Physician (Orthopedic Surgery) Justice Britain, MD as Consulting Physician (Orthopedic Surgery) Ladene Artist, MD as Consulting Physician (Gastroenterology) Elsie Saas, MD as Consulting Physician (Orthopedic Surgery) Shawnie Dapper, DO as Consulting Physician (Ophthalmology) Warden Fillers, MD as Consulting Physician (Ophthalmology)  Subjective:   HPI: Very pleasant patient with 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  Chart reviewed extensively.  He had two rounds of chemo prior to his surgery. He has lost 26 lbs during those two rounds.  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  Has appointment with Dr. Carlis Abbott at [facility] on 02/03/19.  He wanted visit today to discuss symptoms that  started on 01/23/19 which have progressively worsened- When he breathes "It's like someone is sticking a knife up through his nose into his sinuses." Sinus pressure is left nasal. Dr. Runell Gess saw him on 3.17.20, diagnosed him with acute maxillary sinusitis and was given Rx for Augmentin which made it better.   Symptoms have resolved and restarted about a week ago.  Right ear feels still fells like there is fluid behind ear drum.  RIght maxillary sinus very tender.  No fevers or chills.  No cough different than his usual cough.  He does not use flonase.  He is on Elliquis.  Review of Systems  Constitutional: Positive for weight loss. Negative for chills and fever.  HENT: Positive for congestion and sinus pain. Negative for ear discharge, ear pain, hearing loss, nosebleeds, sore throat and tinnitus.   Respiratory: Positive for cough and sputum production. Negative for hemoptysis, shortness of breath, wheezing and stridor.   Skin: Negative.   All other systems reviewed and are negative.    Patient Active Problem List   Diagnosis Date Noted  . Acute maxillary sinusitis 11/03/2018  . 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 Goals of care, counseling/discussion 09/17/2018  . Encounter for antineoplastic chemotherapy 09/17/2018  . 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 Chest pain 11/20/2017  . CKD (chronic kidney disease), stage III ([place]) 11/20/2017  . Atrial fibrillation with RVR ([place]) 04/09/2012  . Insomnia 08/14/2011  . PROSTATE CANCER, HX OF 10/06/2008  . ERECTILE DYSFUNCTION 10/12/2007  . Hyperlipidemia 10/09/2007  . Essential hypertension 10/09/2007  . DIVERTICULITIS, HX OF 08/19/2000    Social History   Tobacco Use  . Smoking status: Former Smoker    Packs/day: 1.00    Years: 20.00    Pack years: 20.00    Types: Cigarettes    Last attempt  to quit: 08/08/1984    Years since quitting: 34.4  . Smokeless tobacco: Never Used  Substance Use Topics  . Alcohol use: Yes     Alcohol/week: 2.0 standard drinks    Types: 2 Cans of beer per week    Comment: socially   2 beer or mixed drinks week    Current Outpatient Medications:  .  ELIQUIS 5 MG TABS tablet, TAKE 1 TABLET BY MOUTH TWICE A DAY, Disp: 60 tablet, Rfl: 6 .  fenofibrate 160 MG tablet, TAKE 1 TABLET BY MOUTH  DAILY, Disp: 90 tablet, Rfl: 2 .  folic acid (FOLVITE) 1 MG tablet, Take 1 tablet (1 mg total) by mouth daily., Disp: 30 tablet, Rfl: 4 .  ondansetron (ZOFRAN) 8 MG tablet, Take 1 tablet (8 mg total) by mouth every 8 (eight) hours as needed for nausea or vomiting., Disp: 20 tablet, Rfl: 0 .  oxybutynin (DITROPAN-XL) 10 MG 24 hr tablet, Take 10 mg by mouth at bedtime., Disp: , Rfl:  .  verapamil (CALAN-SR) 240 MG CR tablet, TAKE 1 TABLET BY MOUTH AT  BEDTIME, Disp: 90 tablet, Rfl: 3 .  amoxicillin-clavulanate (AUGMENTIN) 875-125 MG tablet, Take 1 tablet by mouth 2 (two) times daily for 10 days., Disp: 20 tablet, Rfl: 0  Allergies  Allergen Reactions  . Eucalyptol     Dried Eucalytus:  Head stops up  . Statins Other (See Comments)    Body aches, memory loss    Objective:  BP 127/70   Pulse 81   Temp (!) 97 F (36.1 C)   Wt 170 lb (77.1 kg)   BMI 23.06 kg/m   VITALS: Per patient if applicable, see vitals. GENERAL: Alert, appears well and in no acute distress. HEENT: Atraumatic, conjunctiva clear, no obvious abnormalities on inspection of external nose and ears. NECK: Normal movements of the head and neck. CARDIOPULMONARY: No increased WOB. Speaking in clear sentences. I:E ratio WNL.  MS: Moves all visible extremities without noticeable abnormality. PSYCH: Pleasant and cooperative, well-groomed. Speech normal rate and rhythm. Affect is appropriate. Insight and judgement are appropriate. Attention is focused, linear, and appropriate.  NEURO: CN grossly intact. Oriented as arrived to appointment on time with no prompting. Moves both UE equally.  SKIN: No obvious lesions, wounds, erythema,  or cyanosis noted on face or hands.  Depression screen [facility] 2/9 01/14/2017 01/14/2017 01/14/2017  Decreased Interest 0 0 0  Down, Depressed, Hopeless 0 0 0  PHQ - 2 Score 0 0 0  Some recent data might be hidden    Assessment and Plan:   Daaiel was seen today for sinusitis.  Diagnoses and all orders for this visit:  Acute non-recurrent maxillary sinusitis  Malignant pleural mesothelioma ([place])  Other orders -     amoxicillin-clavulanate (AUGMENTIN) 875-125 MG tablet; Take 1 tablet by mouth 2 (two) times daily for 10 days.    Marland Kitchen COVID-19 Education: The signs and symptoms of COVID-19 were discussed with the patient and how to seek care for testing if needed. The importance of social distancing was discussed today. . Reviewed expectations re: course of current medical issues. . Discussed self-management of symptoms. . Outlined signs and symptoms indicating need for more acute intervention. . Patient verbalized understanding and all questions were answered. Marland Kitchen Health Maintenance issues including appropriate healthy diet, exercise, and smoking avoidance were discussed with patient. . See orders for this visit as documented in the electronic medical record.  Arnette Norris, MD  Records requested if needed. Time spent: 25  minutes, of which >50% was spent in obtaining information about his symptoms, reviewing his previous labs, evaluations, and treatments, counseling him about his condition (please see the discussed topics above), and developing a plan to further investigate it; he had a number of questions which I addressed. Marland Kitchene

## 2019-01-26 NOTE — Assessment & Plan Note (Signed)
>  25 minutes spent in face to face time with patient, >50% spent in counselling or coordination of care discussing his sinus symptoms and updating me with his mesothelioma.  No longer on chemo and now followed at [facility].  Appetite has improved since he stopped chemo.  Given duration and progression of symptoms, will treat for bacterial sinusitis with Augmentin.  He wants to maybe consider Flonase OTC.  Call or send my chart message prn if these symptoms worsen or fail to improve as anticipated. The patient indicates understanding of these issues and agrees with the plan.

## 2019-01-27 ENCOUNTER — Other Ambulatory Visit: Payer: Self-pay

## 2019-01-27 NOTE — Progress Notes (Signed)
Sherin Murdoch/C'ed per pt/thx dmf

## 2019-02-03 DIAGNOSIS — R918 Other nonspecific abnormal finding of lung field: Secondary | ICD-10-CM | POA: Diagnosis not present

## 2019-02-03 DIAGNOSIS — C450 Mesothelioma of pleura: Secondary | ICD-10-CM | POA: Diagnosis not present

## 2019-02-03 DIAGNOSIS — G4700 Insomnia, unspecified: Secondary | ICD-10-CM | POA: Diagnosis not present

## 2019-02-03 DIAGNOSIS — Z87891 Personal history of nicotine dependence: Secondary | ICD-10-CM | POA: Diagnosis not present

## 2019-02-03 DIAGNOSIS — Z9221 Personal history of antineoplastic chemotherapy: Secondary | ICD-10-CM | POA: Diagnosis not present

## 2019-02-03 DIAGNOSIS — Z808 Family history of malignant neoplasm of other organs or systems: Secondary | ICD-10-CM | POA: Diagnosis not present

## 2019-02-03 DIAGNOSIS — Z902 Acquired absence of lung [part of]: Secondary | ICD-10-CM | POA: Diagnosis not present

## 2019-02-03 DIAGNOSIS — Z800 Family history of malignant neoplasm of digestive organs: Secondary | ICD-10-CM | POA: Diagnosis not present

## 2019-02-03 DIAGNOSIS — C459 Mesothelioma, unspecified: Secondary | ICD-10-CM | POA: Diagnosis not present

## 2019-02-03 DIAGNOSIS — Z483 Aftercare following surgery for neoplasm: Secondary | ICD-10-CM | POA: Diagnosis not present

## 2019-02-03 DIAGNOSIS — F5101 Primary insomnia: Secondary | ICD-10-CM | POA: Diagnosis not present

## 2019-02-03 DIAGNOSIS — Z77090 Contact with and (suspected) exposure to asbestos: Secondary | ICD-10-CM | POA: Diagnosis not present

## 2019-02-03 DIAGNOSIS — C457 Mesothelioma of other sites: Secondary | ICD-10-CM | POA: Diagnosis not present

## 2019-02-12 DIAGNOSIS — Z87891 Personal history of nicotine dependence: Secondary | ICD-10-CM | POA: Diagnosis not present

## 2019-02-12 DIAGNOSIS — C457 Mesothelioma of other sites: Secondary | ICD-10-CM | POA: Diagnosis not present

## 2019-02-12 DIAGNOSIS — Z5112 Encounter for antineoplastic immunotherapy: Secondary | ICD-10-CM | POA: Diagnosis not present

## 2019-02-12 DIAGNOSIS — I4819 Other persistent atrial fibrillation: Secondary | ICD-10-CM | POA: Diagnosis not present

## 2019-02-18 DIAGNOSIS — R918 Other nonspecific abnormal finding of lung field: Secondary | ICD-10-CM | POA: Diagnosis not present

## 2019-02-18 DIAGNOSIS — I2699 Other pulmonary embolism without acute cor pulmonale: Secondary | ICD-10-CM | POA: Diagnosis not present

## 2019-02-18 DIAGNOSIS — R0902 Hypoxemia: Secondary | ICD-10-CM | POA: Diagnosis not present

## 2019-02-18 DIAGNOSIS — I10 Essential (primary) hypertension: Secondary | ICD-10-CM | POA: Diagnosis not present

## 2019-02-18 DIAGNOSIS — C771 Secondary and unspecified malignant neoplasm of intrathoracic lymph nodes: Secondary | ICD-10-CM | POA: Diagnosis not present

## 2019-02-18 DIAGNOSIS — Z8589 Personal history of malignant neoplasm of other organs and systems: Secondary | ICD-10-CM | POA: Diagnosis not present

## 2019-02-18 DIAGNOSIS — Z87891 Personal history of nicotine dependence: Secondary | ICD-10-CM | POA: Diagnosis not present

## 2019-02-18 DIAGNOSIS — C7801 Secondary malignant neoplasm of right lung: Secondary | ICD-10-CM | POA: Diagnosis not present

## 2019-02-18 DIAGNOSIS — Z77090 Contact with and (suspected) exposure to asbestos: Secondary | ICD-10-CM | POA: Diagnosis not present

## 2019-02-18 DIAGNOSIS — E781 Pure hyperglyceridemia: Secondary | ICD-10-CM | POA: Diagnosis not present

## 2019-02-18 DIAGNOSIS — R011 Cardiac murmur, unspecified: Secondary | ICD-10-CM | POA: Diagnosis not present

## 2019-02-18 DIAGNOSIS — C450 Mesothelioma of pleura: Secondary | ICD-10-CM | POA: Diagnosis not present

## 2019-02-18 DIAGNOSIS — J189 Pneumonia, unspecified organism: Secondary | ICD-10-CM | POA: Diagnosis not present

## 2019-02-18 DIAGNOSIS — E785 Hyperlipidemia, unspecified: Secondary | ICD-10-CM | POA: Diagnosis not present

## 2019-02-18 DIAGNOSIS — Z85820 Personal history of malignant melanoma of skin: Secondary | ICD-10-CM | POA: Diagnosis not present

## 2019-02-18 DIAGNOSIS — Z7901 Long term (current) use of anticoagulants: Secondary | ICD-10-CM | POA: Diagnosis not present

## 2019-02-18 DIAGNOSIS — J9601 Acute respiratory failure with hypoxia: Secondary | ICD-10-CM | POA: Diagnosis not present

## 2019-02-18 DIAGNOSIS — I4891 Unspecified atrial fibrillation: Secondary | ICD-10-CM | POA: Diagnosis not present

## 2019-02-18 DIAGNOSIS — Z79899 Other long term (current) drug therapy: Secondary | ICD-10-CM | POA: Diagnosis not present

## 2019-02-18 DIAGNOSIS — C457 Mesothelioma of other sites: Secondary | ICD-10-CM | POA: Diagnosis not present

## 2019-02-18 DIAGNOSIS — R5081 Fever presenting with conditions classified elsewhere: Secondary | ICD-10-CM | POA: Diagnosis not present

## 2019-02-18 DIAGNOSIS — H5789 Other specified disorders of eye and adnexa: Secondary | ICD-10-CM | POA: Diagnosis not present

## 2019-02-18 DIAGNOSIS — C7802 Secondary malignant neoplasm of left lung: Secondary | ICD-10-CM | POA: Diagnosis not present

## 2019-02-18 DIAGNOSIS — R0602 Shortness of breath: Secondary | ICD-10-CM | POA: Diagnosis not present

## 2019-02-19 DIAGNOSIS — R0602 Shortness of breath: Secondary | ICD-10-CM | POA: Diagnosis not present

## 2019-02-24 DIAGNOSIS — R0602 Shortness of breath: Secondary | ICD-10-CM | POA: Diagnosis not present

## 2019-02-24 DIAGNOSIS — C457 Mesothelioma of other sites: Secondary | ICD-10-CM | POA: Diagnosis not present

## 2019-02-24 MED ORDER — TUSSI PRES-B 2-15-200 MG/5ML PO LIQD
0.50 | ORAL | Status: DC
Start: ? — End: 2019-02-24

## 2019-02-24 MED ORDER — QUINERVA 260 MG PO TABS
650.00 | ORAL_TABLET | ORAL | Status: DC
Start: ? — End: 2019-02-24

## 2019-02-24 MED ORDER — BISACODYL 5 MG PO TBEC
10.00 | DELAYED_RELEASE_TABLET | ORAL | Status: DC
Start: ? — End: 2019-02-24

## 2019-02-24 MED ORDER — CALCIUM-VITAMIN D
600.00 | Status: DC
Start: ? — End: 2019-02-24

## 2019-02-24 MED ORDER — ENOXAPARIN SODIUM 80 MG/0.8ML ~~LOC~~ SOLN
1.00 | SUBCUTANEOUS | Status: DC
Start: 2019-02-24 — End: 2019-02-24

## 2019-02-24 MED ORDER — METOPROLOL SUCCINATE ER 50 MG PO TB24
200.00 | ORAL_TABLET | ORAL | Status: DC
Start: 2019-02-25 — End: 2019-02-24

## 2019-02-24 MED ORDER — SALINE NASAL SPRAY 0.65 % NA SOLN
1.00 | NASAL | Status: DC
Start: ? — End: 2019-02-24

## 2019-02-24 MED ORDER — CARBOXYMETHYLCELLULOSE SOD PF 0.5 % OP SOLN
1.00 | OPHTHALMIC | Status: DC
Start: ? — End: 2019-02-24

## 2019-02-24 MED ORDER — OXYBUTYNIN CHLORIDE ER 10 MG PO TB24
10.00 | ORAL_TABLET | ORAL | Status: DC
Start: 2019-02-25 — End: 2019-02-24

## 2019-02-24 MED ORDER — TRAZODONE HCL 50 MG PO TABS
50.00 | ORAL_TABLET | ORAL | Status: DC
Start: 2019-02-24 — End: 2019-02-24

## 2019-02-24 MED ORDER — ALBUTEROL SULFATE 2.5 MG/0.5ML IN NEBU
2.50 | INHALATION_SOLUTION | RESPIRATORY_TRACT | Status: DC
Start: ? — End: 2019-02-24

## 2019-02-24 MED ORDER — SODIUM CHLORIDE 3 % IN NEBU
4.00 | INHALATION_SOLUTION | RESPIRATORY_TRACT | Status: DC
Start: 2019-02-24 — End: 2019-02-24

## 2019-03-05 DIAGNOSIS — R0609 Other forms of dyspnea: Secondary | ICD-10-CM | POA: Diagnosis not present

## 2019-03-05 DIAGNOSIS — R5383 Other fatigue: Secondary | ICD-10-CM | POA: Diagnosis not present

## 2019-03-05 DIAGNOSIS — T451X5A Adverse effect of antineoplastic and immunosuppressive drugs, initial encounter: Secondary | ICD-10-CM | POA: Diagnosis not present

## 2019-03-05 DIAGNOSIS — M545 Low back pain: Secondary | ICD-10-CM | POA: Diagnosis not present

## 2019-03-05 DIAGNOSIS — B370 Candidal stomatitis: Secondary | ICD-10-CM | POA: Diagnosis not present

## 2019-03-05 DIAGNOSIS — Z7901 Long term (current) use of anticoagulants: Secondary | ICD-10-CM | POA: Diagnosis not present

## 2019-03-05 DIAGNOSIS — R05 Cough: Secondary | ICD-10-CM | POA: Diagnosis not present

## 2019-03-05 DIAGNOSIS — Z85820 Personal history of malignant melanoma of skin: Secondary | ICD-10-CM | POA: Diagnosis not present

## 2019-03-05 DIAGNOSIS — I4891 Unspecified atrial fibrillation: Secondary | ICD-10-CM | POA: Diagnosis not present

## 2019-03-05 DIAGNOSIS — C457 Mesothelioma of other sites: Secondary | ICD-10-CM | POA: Diagnosis not present

## 2019-03-05 DIAGNOSIS — Z87891 Personal history of nicotine dependence: Secondary | ICD-10-CM | POA: Diagnosis not present

## 2019-03-05 DIAGNOSIS — R0789 Other chest pain: Secondary | ICD-10-CM | POA: Diagnosis not present

## 2019-03-05 DIAGNOSIS — R0600 Dyspnea, unspecified: Secondary | ICD-10-CM | POA: Diagnosis not present

## 2019-03-05 DIAGNOSIS — Z5112 Encounter for antineoplastic immunotherapy: Secondary | ICD-10-CM | POA: Diagnosis not present

## 2019-03-05 DIAGNOSIS — I2699 Other pulmonary embolism without acute cor pulmonale: Secondary | ICD-10-CM | POA: Diagnosis not present

## 2019-03-08 DIAGNOSIS — R0602 Shortness of breath: Secondary | ICD-10-CM | POA: Diagnosis not present

## 2019-03-08 DIAGNOSIS — C457 Mesothelioma of other sites: Secondary | ICD-10-CM | POA: Diagnosis not present

## 2019-03-16 DIAGNOSIS — Z515 Encounter for palliative care: Secondary | ICD-10-CM | POA: Diagnosis not present

## 2019-03-16 DIAGNOSIS — R0602 Shortness of breath: Secondary | ICD-10-CM | POA: Diagnosis not present

## 2019-03-19 DIAGNOSIS — R5383 Other fatigue: Secondary | ICD-10-CM | POA: Diagnosis not present

## 2019-03-19 DIAGNOSIS — L89152 Pressure ulcer of sacral region, stage 2: Secondary | ICD-10-CM | POA: Diagnosis not present

## 2019-03-19 DIAGNOSIS — Z5112 Encounter for antineoplastic immunotherapy: Secondary | ICD-10-CM | POA: Diagnosis not present

## 2019-03-19 DIAGNOSIS — J984 Other disorders of lung: Secondary | ICD-10-CM | POA: Diagnosis not present

## 2019-03-19 DIAGNOSIS — Z86711 Personal history of pulmonary embolism: Secondary | ICD-10-CM | POA: Diagnosis not present

## 2019-03-19 DIAGNOSIS — Z7901 Long term (current) use of anticoagulants: Secondary | ICD-10-CM | POA: Diagnosis not present

## 2019-03-19 DIAGNOSIS — L89891 Pressure ulcer of other site, stage 1: Secondary | ICD-10-CM | POA: Diagnosis not present

## 2019-03-19 DIAGNOSIS — R0600 Dyspnea, unspecified: Secondary | ICD-10-CM | POA: Diagnosis not present

## 2019-03-19 DIAGNOSIS — I2699 Other pulmonary embolism without acute cor pulmonale: Secondary | ICD-10-CM | POA: Diagnosis not present

## 2019-03-19 DIAGNOSIS — C457 Mesothelioma of other sites: Secondary | ICD-10-CM | POA: Diagnosis not present

## 2019-03-19 DIAGNOSIS — Z87891 Personal history of nicotine dependence: Secondary | ICD-10-CM | POA: Diagnosis not present

## 2019-03-24 ENCOUNTER — Ambulatory Visit: Payer: Medicare Other | Admitting: Internal Medicine

## 2019-03-27 DIAGNOSIS — R0602 Shortness of breath: Secondary | ICD-10-CM | POA: Diagnosis not present

## 2019-03-27 DIAGNOSIS — C457 Mesothelioma of other sites: Secondary | ICD-10-CM | POA: Diagnosis not present

## 2019-04-02 DIAGNOSIS — R0602 Shortness of breath: Secondary | ICD-10-CM | POA: Diagnosis not present

## 2019-04-02 DIAGNOSIS — Z66 Do not resuscitate: Secondary | ICD-10-CM | POA: Diagnosis not present

## 2019-04-02 DIAGNOSIS — C459 Mesothelioma, unspecified: Secondary | ICD-10-CM | POA: Diagnosis not present

## 2019-04-02 DIAGNOSIS — Z7189 Other specified counseling: Secondary | ICD-10-CM | POA: Diagnosis not present

## 2019-04-02 DIAGNOSIS — R5381 Other malaise: Secondary | ICD-10-CM | POA: Diagnosis not present

## 2019-04-03 IMAGING — DX DG CHEST 2V
2 series · 2 of 2 positions shown · non-contrast
Comparison: CT and x-ray earlier today

CLINICAL DATA: Left pleural effusion

EXAM:
CHEST - 2 VIEW

[chest pa]
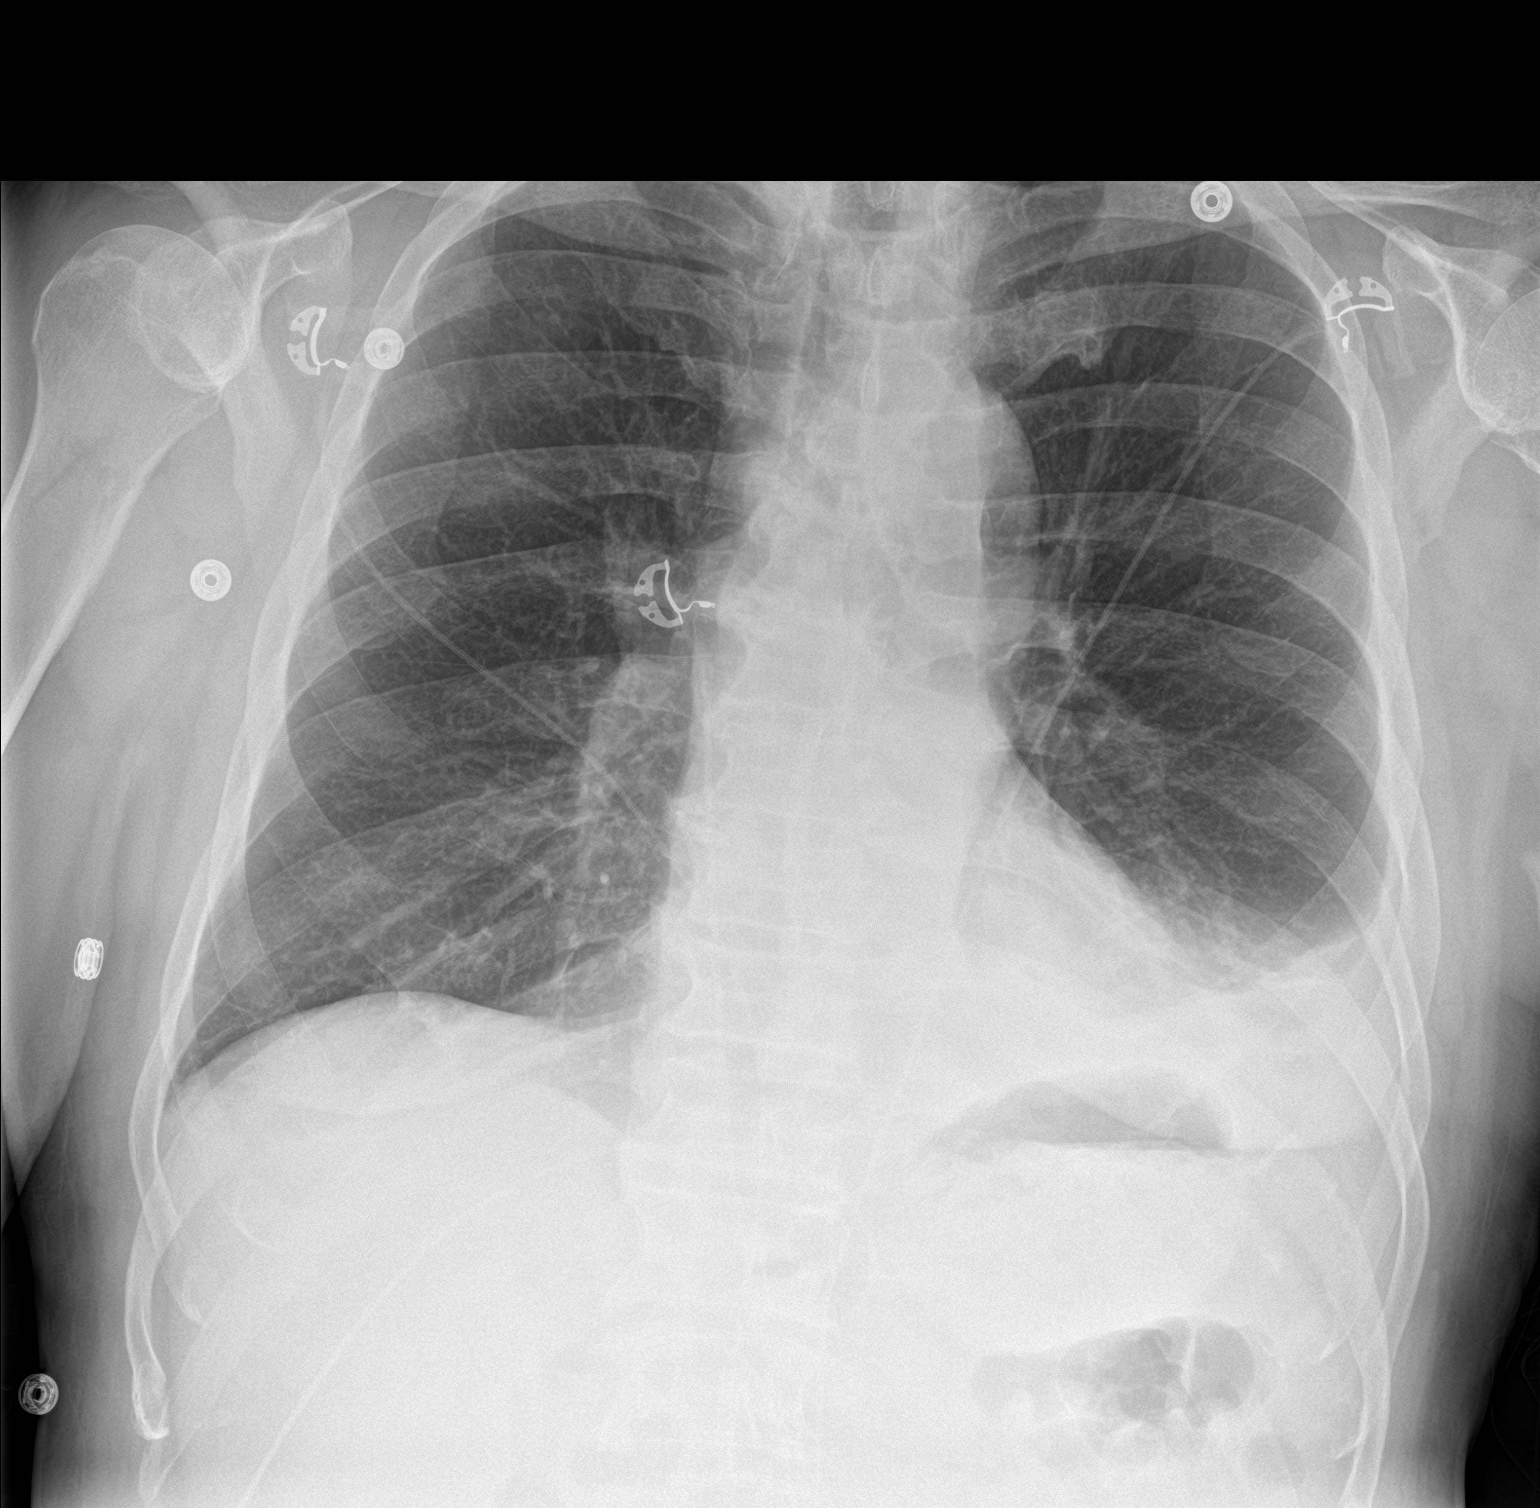

[chest lat]
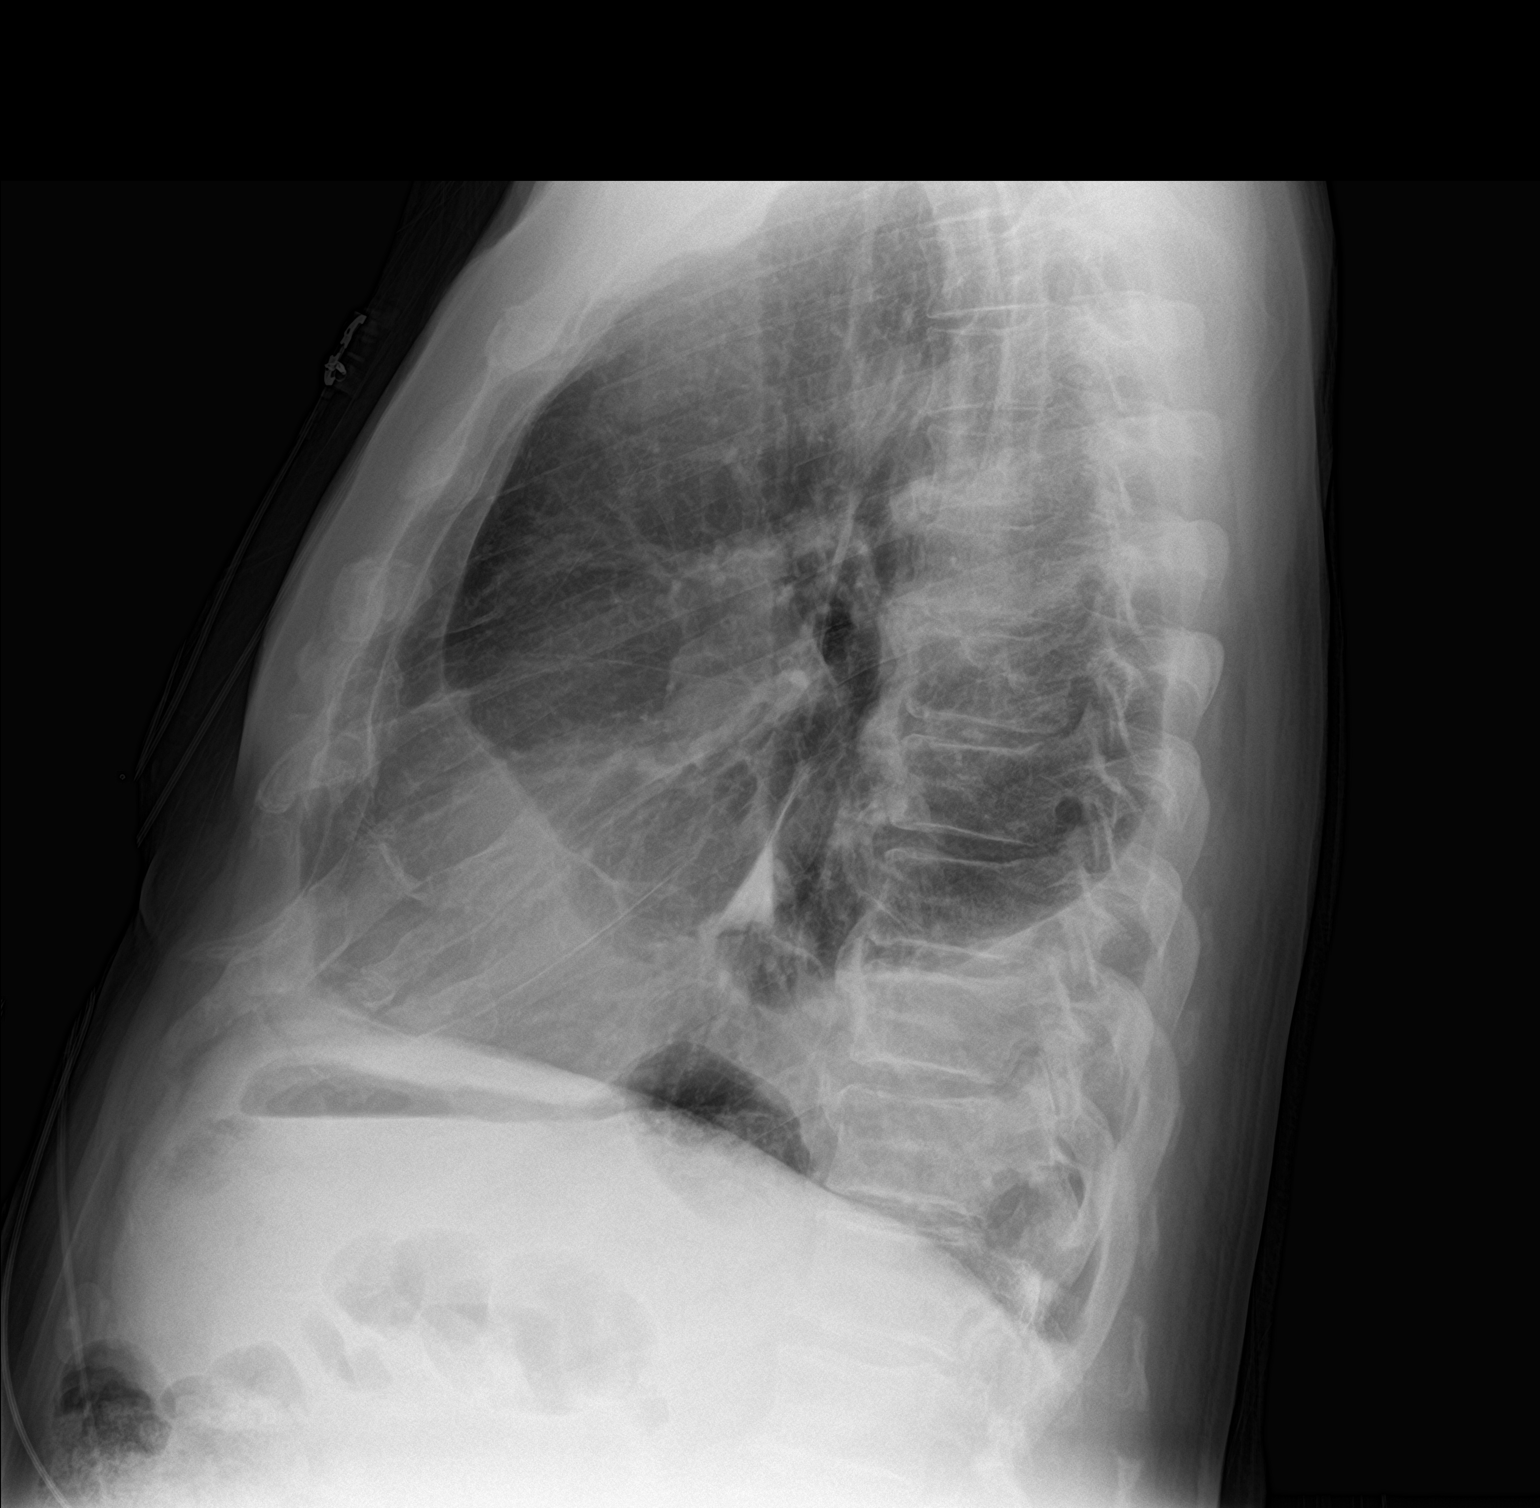

[2 of 2 positions shown; findings below may reference images not displayed]

FINDINGS: Small left pleural effusion slightly increased from earlier today..
Mild left lower lobe atelectasis/infiltrate unchanged.

Right lung clear.  Negative for heart failure.
IMPRESSION: Small left pleural effusion and left lower lobe
atelectasis/infiltrate. Mild interval increase in small left
effusion.

## 2019-04-04 IMAGING — DX DG CHEST 1V
1 series · 1 of 1 positions shown · non-contrast
Comparison: November 22, 2017

CLINICAL DATA: Status post thoracentesis

EXAM:
CHEST  1 VIEW

[chest pa]
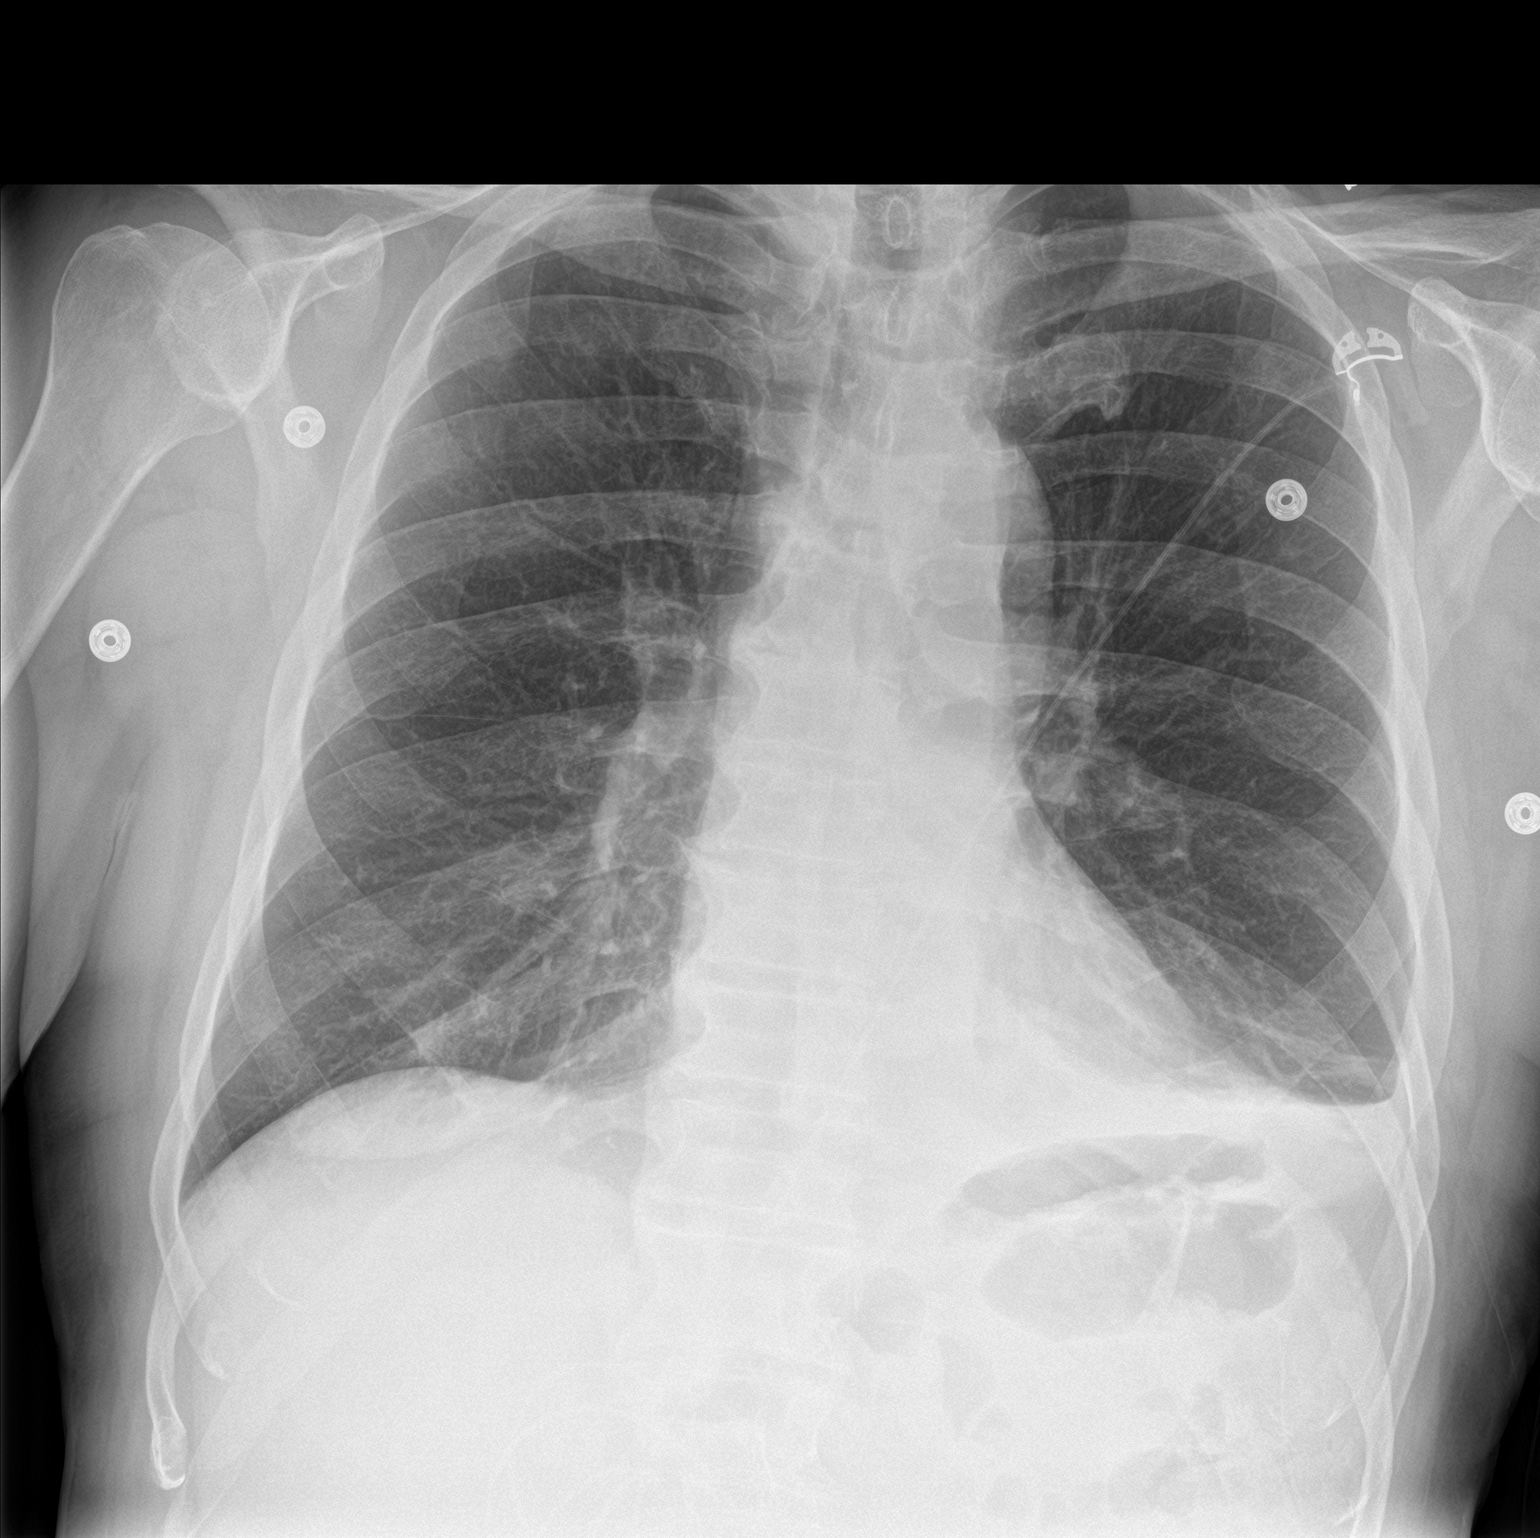

[1 of 1 positions shown; findings below may reference images not displayed]

FINDINGS: No pneumothorax after left thoracentesis. A small left effusion
remains, smaller in the interval. Opacity under the effusion is
likely atelectasis. No other interval changes.
IMPRESSION: Persistent small left pleural effusion, improved in the interval
after thoracentesis. No pneumothorax.

## 2019-04-04 IMAGING — DX DG CHEST 2V
2 series · 2 of 2 positions shown · non-contrast
Comparison: 11/21/2017.

CLINICAL DATA: Followup left pleural effusion.

EXAM:
CHEST - 2 VIEW

[w chest pa]
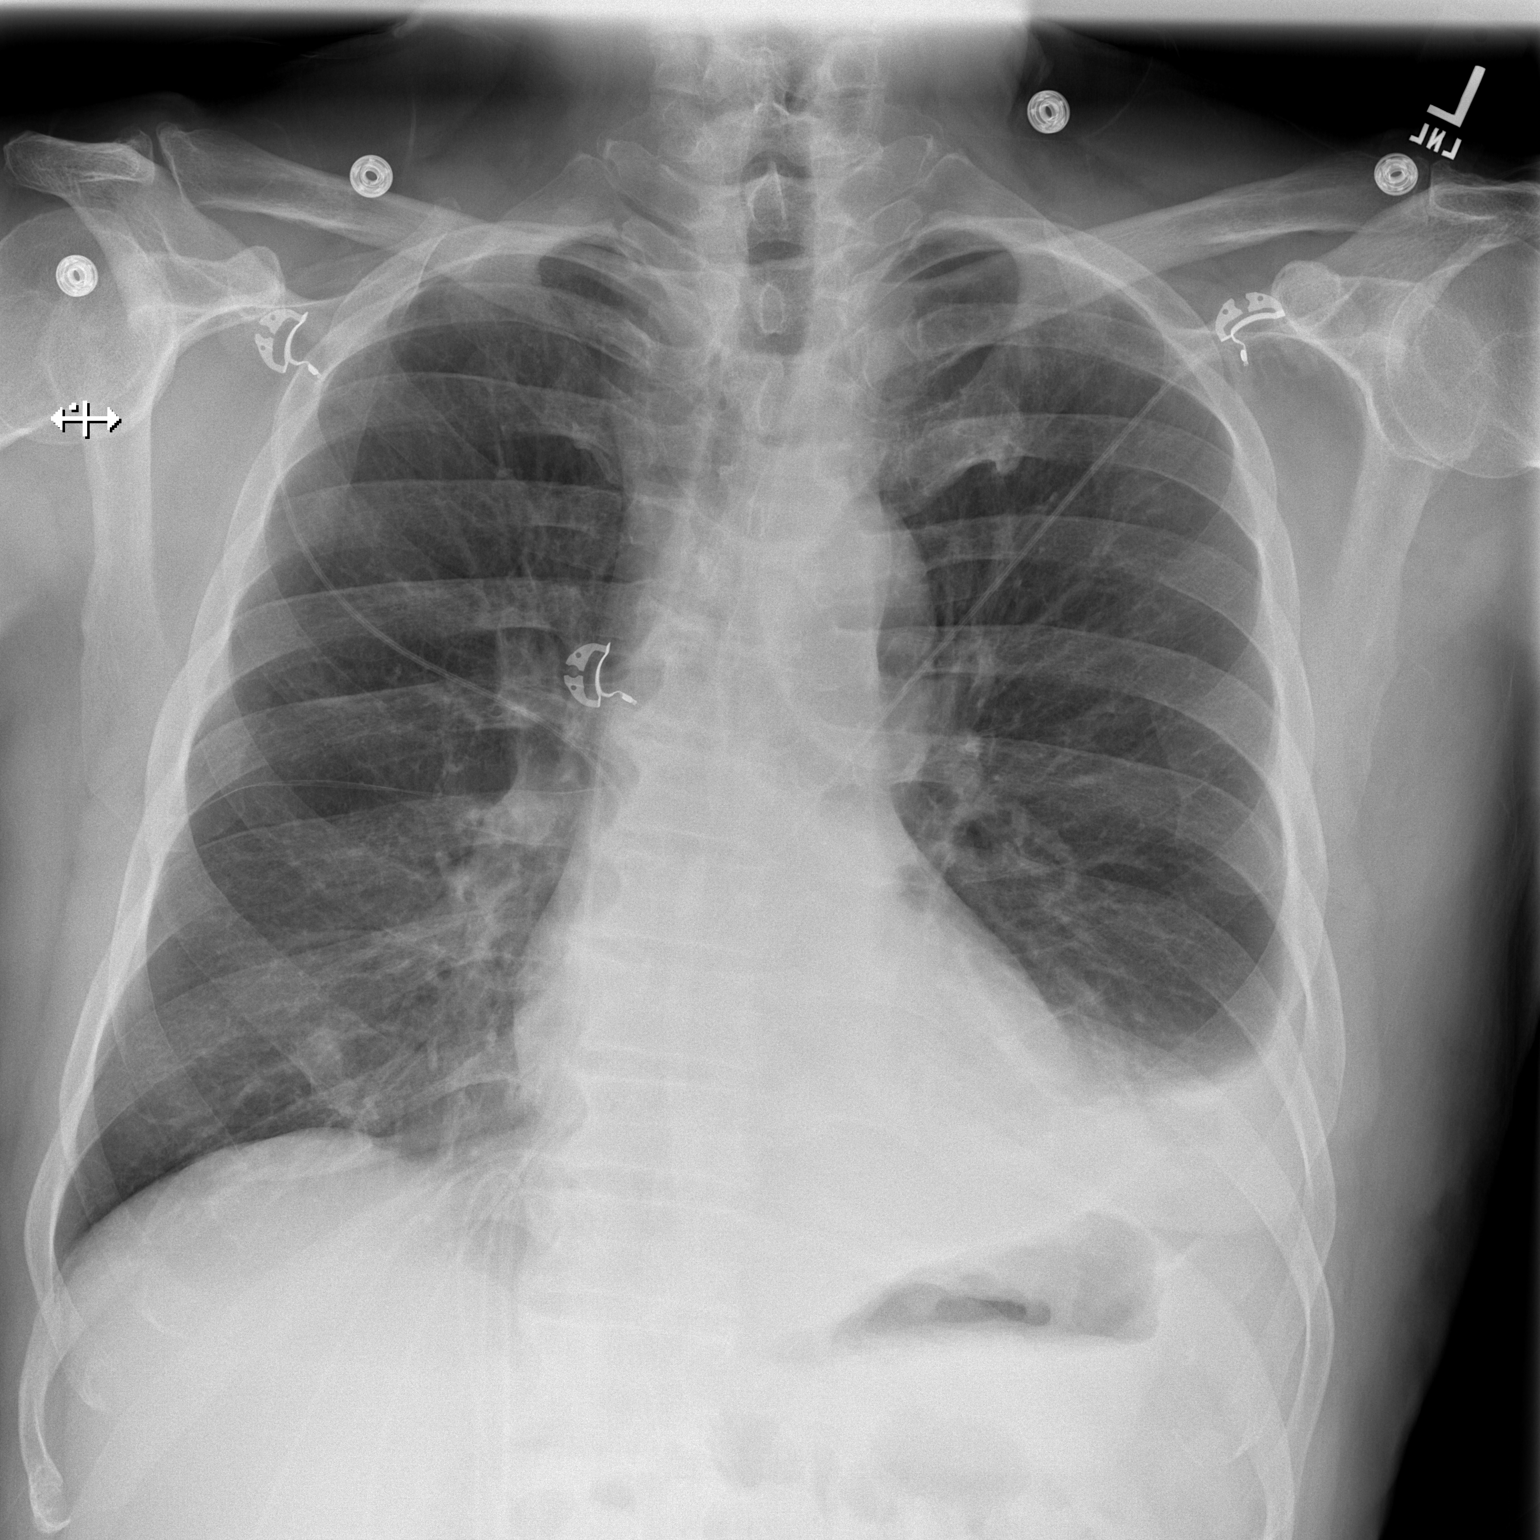

[w chest lat]
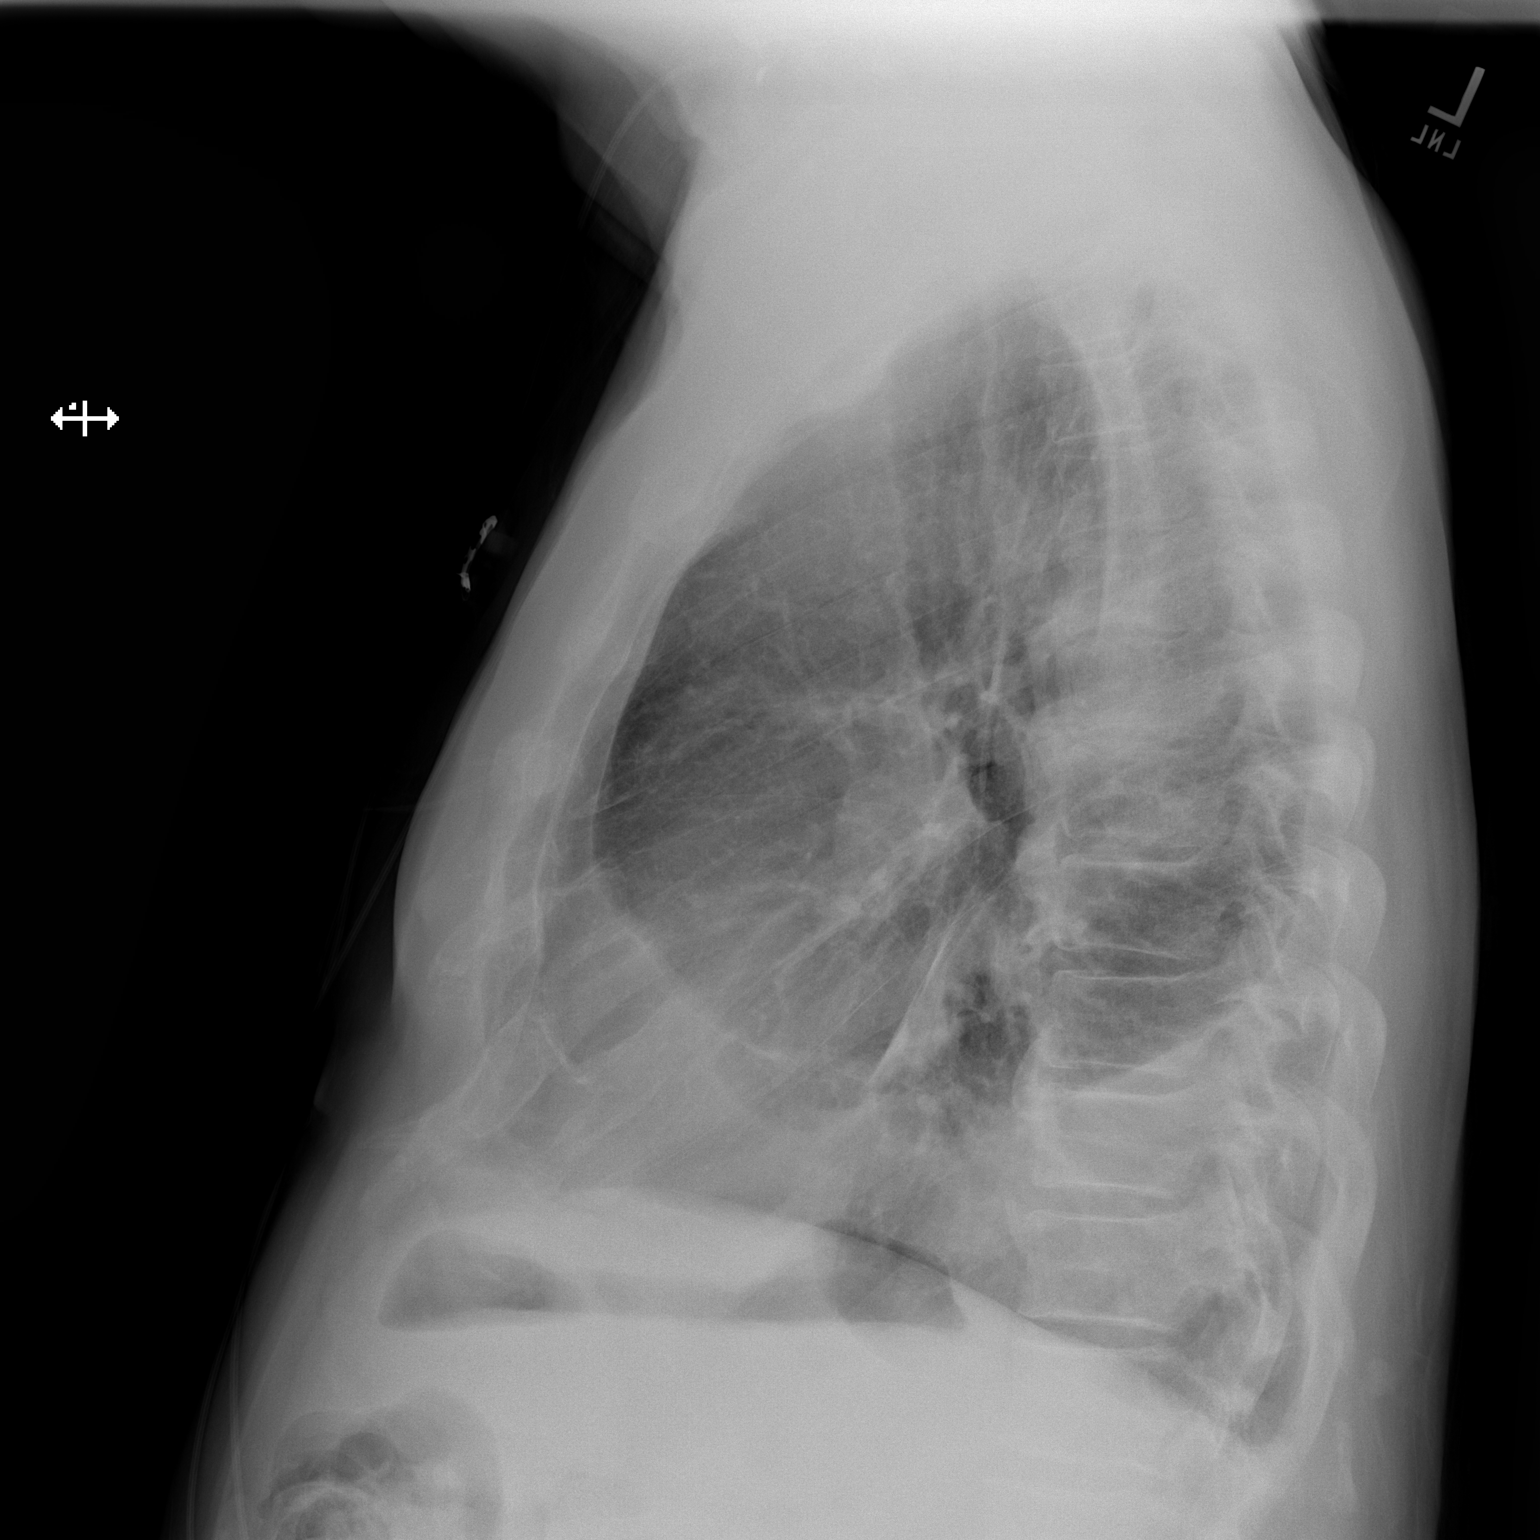

[2 of 2 positions shown; findings below may reference images not displayed]

FINDINGS: Small to moderate-sized left pleural effusion is stable from the
previous day's exam. There is mild associated lung base atelectasis.
Remainder of the lungs is clear. No right pleural effusion. No
pneumothorax.

Heart, mediastinum and hila are unremarkable.

Skeletal structures are intact.
IMPRESSION: 1. Small to moderate-sized left pleural effusion without change
previous day's study. Mild associated left lung base atelectasis. No
convincing pneumonia or pulmonary edema.

## 2019-05-13 ENCOUNTER — Ambulatory Visit: Payer: Medicare Other | Admitting: Internal Medicine

## 2019-05-20 DEATH — deceased
